# Patient Record
Sex: Female | Born: 1955 | Race: White | Hispanic: No | Marital: Married | Smoking: Never smoker
Health system: Southern US, Community
[De-identification: ages and names within clinical notes are randomized; demographics above are authoritative.]

## PROBLEM LIST (undated history)

## (undated) DIAGNOSIS — K635 Polyp of colon: Secondary | ICD-10-CM

## (undated) DIAGNOSIS — M1990 Unspecified osteoarthritis, unspecified site: Secondary | ICD-10-CM

## (undated) DIAGNOSIS — J302 Other seasonal allergic rhinitis: Secondary | ICD-10-CM

## (undated) DIAGNOSIS — R7303 Prediabetes: Secondary | ICD-10-CM

## (undated) DIAGNOSIS — I471 Supraventricular tachycardia: Secondary | ICD-10-CM

## (undated) DIAGNOSIS — I4719 Other supraventricular tachycardia: Secondary | ICD-10-CM

## (undated) DIAGNOSIS — K279 Peptic ulcer, site unspecified, unspecified as acute or chronic, without hemorrhage or perforation: Secondary | ICD-10-CM

## (undated) DIAGNOSIS — E7800 Pure hypercholesterolemia, unspecified: Secondary | ICD-10-CM

## (undated) DIAGNOSIS — K862 Cyst of pancreas: Secondary | ICD-10-CM

## (undated) DIAGNOSIS — D3500 Benign neoplasm of unspecified adrenal gland: Secondary | ICD-10-CM

## (undated) DIAGNOSIS — R112 Nausea with vomiting, unspecified: Secondary | ICD-10-CM

## (undated) DIAGNOSIS — Z9889 Other specified postprocedural states: Secondary | ICD-10-CM

## (undated) HISTORY — PX: TONSILLECTOMY: SUR1361

## (undated) HISTORY — PX: WISDOM TOOTH EXTRACTION: SHX21

## (undated) HISTORY — PX: CERVICAL CERCLAGE: SHX1329

## (undated) HISTORY — DX: Polyp of colon: K63.5

## (undated) HISTORY — PX: COLONOSCOPY W/ BIOPSIES AND POLYPECTOMY: SHX1376

---

## 1998-07-16 ENCOUNTER — Ambulatory Visit (HOSPITAL_COMMUNITY): Admission: RE | Admit: 1998-07-16 | Discharge: 1998-07-16 | Payer: Self-pay | Admitting: *Deleted

## 2000-01-10 ENCOUNTER — Other Ambulatory Visit: Admission: RE | Admit: 2000-01-10 | Discharge: 2000-01-10 | Payer: Self-pay | Admitting: *Deleted

## 2011-09-17 ENCOUNTER — Other Ambulatory Visit: Payer: Self-pay | Admitting: Family Medicine

## 2011-09-17 DIAGNOSIS — Z1231 Encounter for screening mammogram for malignant neoplasm of breast: Secondary | ICD-10-CM

## 2011-10-21 ENCOUNTER — Ambulatory Visit: Payer: Self-pay

## 2011-10-24 ENCOUNTER — Ambulatory Visit
Admission: RE | Admit: 2011-10-24 | Discharge: 2011-10-24 | Disposition: A | Discharge status: Home / Self Care | Payer: 59 | Source: Ambulatory Visit | Attending: Family Medicine | Admitting: Family Medicine

## 2011-10-24 DIAGNOSIS — Z1231 Encounter for screening mammogram for malignant neoplasm of breast: Secondary | ICD-10-CM

## 2013-06-16 ENCOUNTER — Other Ambulatory Visit: Payer: Self-pay | Admitting: Orthopedic Surgery

## 2013-06-16 DIAGNOSIS — S92102A Unspecified fracture of left talus, initial encounter for closed fracture: Secondary | ICD-10-CM

## 2013-06-17 ENCOUNTER — Ambulatory Visit
Admission: RE | Admit: 2013-06-17 | Discharge: 2013-06-17 | Disposition: A | Discharge status: Home / Self Care | Payer: BC Managed Care – PPO | Source: Ambulatory Visit | Attending: Orthopedic Surgery | Admitting: Orthopedic Surgery

## 2013-06-17 DIAGNOSIS — S92102A Unspecified fracture of left talus, initial encounter for closed fracture: Secondary | ICD-10-CM

## 2013-08-17 ENCOUNTER — Telehealth: Payer: Self-pay | Admitting: Family Medicine

## 2013-08-17 MED ORDER — FLUTICASONE PROPIONATE 50 MCG/ACT NA SUSP: 2.0000 | Freq: Every day | NASAL | Status: DC

## 2013-08-17 NOTE — Telephone Encounter (Signed)
Rx Refilled  

## 2013-08-17 NOTE — Telephone Encounter (Signed)
Flonase 2 sprays in each nostril QD  It is only listed in paper chart during ov on 02/21/11.

## 2013-08-23 ENCOUNTER — Encounter: Payer: Self-pay | Admitting: Family Medicine

## 2013-08-23 ENCOUNTER — Ambulatory Visit (INDEPENDENT_AMBULATORY_CARE_PROVIDER_SITE_OTHER): Payer: BC Managed Care – PPO | Admitting: Family Medicine

## 2013-08-23 VITALS — BP 122/74 | HR 68 | Temp 98.1°F | Resp 16

## 2013-08-23 DIAGNOSIS — L821 Other seborrheic keratosis: Secondary | ICD-10-CM

## 2013-08-23 DIAGNOSIS — Z23 Encounter for immunization: Secondary | ICD-10-CM

## 2013-08-23 NOTE — Progress Notes (Signed)
  Subjective:    Patient ID: Jill [name], female    DOB: 1956-08-31, 57 y.o.   MRN: [id number]  HPI  Patient is a very pleasant 57 year old white female who comes in today complaining of a mole on her back. She states it is a large mole in between her shoulder blades around the level of her bra strap.  Her husband is concerned that it may be growing. She states that it does not hurt, it does not bleed, they have not noticed any change in color. It is approximately 8 mm. It is brown and wartlike in appearance.  There are no melanotic  Past Medical History  Diagnosis Date  . Colon polyp   . Allergy    Current Outpatient Prescriptions on File Prior to Visit  Medication Sig Dispense Refill  . fluticasone (FLONASE) 50 MCG/ACT nasal spray Place 2 sprays into the nose daily.  16 g  11   No current facility-administered medications on file prior to visit.   Allergies  Allergen Reactions  . Codeine Nausea Only    Dizziness  . Valium [Diazepam] Nausea Only    Dizziness  . Penicillins Rash   History   Social History  . Marital Status: Married    Spouse Name: N/A    Number of Children: N/A  . Years of Education: N/A   Occupational History  . Not on file.   Social History Main Topics  . Smoking status: Never Smoker   . Smokeless tobacco: Not on file  . Alcohol Use: Yes     Comment: occasional  . Drug Use: No  . Sexual Activity: Not on file   Other Topics Concern  . Not on file   Social History Narrative  . No narrative on file   Family History  Problem Relation Age of Onset  . Dementia Mother   . Diabetes Father   . Heart disease Father   . Hypertension Father   . Cancer Sister     endometrial  . Cancer Maternal Aunt     ovarian      Review of Systems  All other systems reviewed and are negative.       Objective:   Physical Exam  Vitals reviewed. Cardiovascular: Normal rate and regular rhythm.   Pulmonary/Chest: Effort normal and breath sounds normal.   Skin: Skin is warm. No rash noted.   7-8 mm brown polyp around the level of T7 in the midline of the back.  It is verruciform in appearance.        Assessment & Plan:  1. Seborrheic keratoses I believe this is a seborrheic keratosis.  I reassured the patient that is not cancerous. I offered an excisional biopsy to be 100% sure. The patient is comfortable with simple reassurance and elects not to perform a biopsy. I will give her a flu shot today. We can certainly biopsy the lesion if it gets bigger or if it changes or she wants to be 100% sure.

## 2013-10-25 ENCOUNTER — Other Ambulatory Visit: Payer: Self-pay | Admitting: Family Medicine

## 2014-05-23 ENCOUNTER — Encounter: Payer: Self-pay | Admitting: Family Medicine

## 2014-05-23 ENCOUNTER — Ambulatory Visit (INDEPENDENT_AMBULATORY_CARE_PROVIDER_SITE_OTHER): Payer: BC Managed Care – PPO | Admitting: Family Medicine

## 2014-05-23 VITALS — BP 118/74 | HR 76 | Temp 98.2°F | Resp 14

## 2014-05-23 DIAGNOSIS — W57XXXA Bitten or stung by nonvenomous insect and other nonvenomous arthropods, initial encounter: Secondary | ICD-10-CM

## 2014-05-23 DIAGNOSIS — T148 Other injury of unspecified body region: Secondary | ICD-10-CM

## 2014-05-23 DIAGNOSIS — F430 Acute stress reaction: Secondary | ICD-10-CM

## 2014-05-23 DIAGNOSIS — J029 Acute pharyngitis, unspecified: Secondary | ICD-10-CM

## 2014-05-23 LAB — RAPID STREP SCREEN (MED CTR MEBANE ONLY): Streptococcus, Group A Screen (Direct): POSITIVE — AB

## 2014-05-23 MED ORDER — DOXYCYCLINE HYCLATE 100 MG PO TABS: 100.0000 mg | ORAL_TABLET | Freq: Two times a day (BID) | ORAL | Status: DC

## 2014-05-23 NOTE — Progress Notes (Signed)
Patient ID: MEAGHANN CHOO, female   DOB: [birth year], 58 y.o.   MRN: [id number]   Subjective:    Patient ID: Broadus John, female    DOB: 1955/12/26, 58 y.o.   MRN: [id number]  Patient presents for Illness and Rash  patient here with sore throat for the past 24 hours she's not had any fever she has had some mild sinus drainage but she also been crying a lot yesterday therefore she got is from a little postnasal drip. She also had a tickle in her right upper shoulder that she removed which resulted in a red spot but over the weekend she noticed a small bump on her left side below the shoulder blade with a red rash that extended from it she did not remove a tick from here but thought that she likely had one there is well as she has a lot of ticks in her backyard. She's not had any joint pain or swelling no myalgias.  There has been a lot of stress at home as her father has been put into hospice and the family is not on the same page with his care.    Review Of Systems:  GEN- denies fatigue, fever, weight loss,weakness, recent illness HEENT- denies eye drainage, change in vision, nasal discharge, CVS- denies chest pain, palpitations RESP- denies SOB, cough, wheeze ABD- denies N/V, change in stools, abd pain MSK- denies joint pain, muscle aches, injury Neuro- denies headache, dizziness, syncope, seizure activity       Objective:    BP 118/74  Pulse 76  Temp(Src) 98.2 F (36.8 C) (Oral)  Resp 14 GEN- NAD, alert and oriented x3 HEENT- PERRL, EOMI, non injected sclera, pink conjunctiva, MMM, oropharynx  Injected, TM clear bilat, no tonsils Neck- Supple, shotty LAD CVS- RRR, no murmur RESP-CTAB Skin- Right shoulder- erythematous papule with scab in center, Left back 2cm below shoulder blade eythematous scab with dime dime size area of erythema and a ecchymotic like rash extending 3-4cm above it, NT,  Note - small tick removed off right lower thoracic region with dime size erythema around  it, no fluctuant areas Psych- crying discussing family, not anxious appearing, well groomed, no SI  Pulses- Radial 2+        Assessment & Plan:      Problem List Items Addressed This Visit   None    Visit Diagnoses   Acute pharyngitis, unspecified pharyngitis type    -  Primary    - strep positive, treat with doxy, due to ticks and PCN allergy    Relevant Orders       Rapid Strep Screen (Completed)    Tick bite        Will treat with doxy x 10 days, she will call if not improved     Stress reaction        No intervention needed at this time, a big change for family with fathers declining health,        Note: This dictation was prepared with Dragon dictation along with smaller phrase technology. Any transcriptional errors that result from this process are unintentional.

## 2014-05-23 NOTE — Patient Instructions (Signed)
Take antibiotics as prescribed Take ibuprofen as needed Gargle salt water  F/U as needed

## 2014-12-05 ENCOUNTER — Ambulatory Visit (INDEPENDENT_AMBULATORY_CARE_PROVIDER_SITE_OTHER): Payer: BLUE CROSS/BLUE SHIELD | Admitting: *Deleted

## 2014-12-05 DIAGNOSIS — Z23 Encounter for immunization: Secondary | ICD-10-CM

## 2015-01-11 ENCOUNTER — Other Ambulatory Visit: Payer: BLUE CROSS/BLUE SHIELD

## 2015-01-11 DIAGNOSIS — Z0000 Encounter for general adult medical examination without abnormal findings: Secondary | ICD-10-CM

## 2015-01-12 LAB — CBC WITH DIFFERENTIAL/PLATELET
Basophils Absolute: 0.1 10*3/uL (ref 0.0–0.1)
Basophils Relative: 1 % (ref 0–1)
Eosinophils Absolute: 0.2 10*3/uL (ref 0.0–0.7)
Eosinophils Relative: 3 % (ref 0–5)
HEMATOCRIT: 39.2 % (ref 36.0–46.0)
Hemoglobin: 13.2 g/dL (ref 12.0–15.0)
LYMPHS ABS: 1.8 10*3/uL (ref 0.7–4.0)
LYMPHS PCT: 26 % (ref 12–46)
MCH: 26.4 pg (ref 26.0–34.0)
MCHC: 33.7 g/dL (ref 30.0–36.0)
MCV: 78.4 fL (ref 78.0–100.0)
MPV: 9.5 fL (ref 8.6–12.4)
Monocytes Absolute: 0.4 10*3/uL (ref 0.1–1.0)
Monocytes Relative: 6 % (ref 3–12)
NEUTROS PCT: 64 % (ref 43–77)
Neutro Abs: 4.4 10*3/uL (ref 1.7–7.7)
Platelets: 266 10*3/uL (ref 150–400)
RBC: 5 MIL/uL (ref 3.87–5.11)
RDW: 14.1 % (ref 11.5–15.5)
WBC: 6.8 10*3/uL (ref 4.0–10.5)

## 2015-01-12 LAB — LIPID PANEL
Cholesterol: 260 mg/dL — ABNORMAL HIGH (ref 0–200)
HDL: 70 mg/dL (ref >39)
LDL Cholesterol: 151 mg/dL — ABNORMAL HIGH (ref 0–99)
Total CHOL/HDL Ratio: 3.7 Ratio
Triglycerides: 196 mg/dL — ABNORMAL HIGH (ref <150)
VLDL: 39 mg/dL (ref 0–40)

## 2015-01-12 LAB — COMPLETE METABOLIC PANEL WITH GFR
ALBUMIN: 4.3 g/dL (ref 3.5–5.2)
ALT: 21 U/L (ref 0–35)
AST: 19 U/L (ref 0–37)
Alkaline Phosphatase: 57 U/L (ref 39–117)
BUN: 16 mg/dL (ref 6–23)
CALCIUM: 9.4 mg/dL (ref 8.4–10.5)
CO2: 27 mEq/L (ref 19–32)
Chloride: 104 mEq/L (ref 96–112)
Creat: 0.8 mg/dL (ref 0.50–1.10)
GFR, Est African American: >89 mL/min
GFR, Est Non African American: 83 mL/min
GLUCOSE: 84 mg/dL (ref 70–99)
POTASSIUM: 4.2 meq/L (ref 3.5–5.3)
Sodium: 142 mEq/L (ref 135–145)
Total Bilirubin: 0.4 mg/dL (ref 0.2–1.2)
Total Protein: 6.7 g/dL (ref 6.0–8.3)

## 2015-01-22 ENCOUNTER — Ambulatory Visit (INDEPENDENT_AMBULATORY_CARE_PROVIDER_SITE_OTHER): Payer: BLUE CROSS/BLUE SHIELD | Admitting: Family Medicine

## 2015-01-22 ENCOUNTER — Encounter: Payer: Self-pay | Admitting: Family Medicine

## 2015-01-22 VITALS — BP 114/74 | HR 78 | Temp 98.9°F | Resp 14 | Ht 63.0 in | Wt 153.0 lb

## 2015-01-22 DIAGNOSIS — Z0000 Encounter for general adult medical examination without abnormal findings: Secondary | ICD-10-CM

## 2015-01-22 NOTE — Progress Notes (Signed)
Subjective:    Patient ID: Jill [name], female    DOB: 1956/05/05, 59 y.o.   MRN: [id number]  HPI She is here today for complete physical exam. She denies any concerns. Her mammogram is performed to her gynecologist and is up-to-date. Her last colonoscopy was performed in 2012 according to the patient and is up-to-date. Her Pap smear is performed through her gynecologist and is not yet due. She is getting her Pap smear every 3 years. Patient's flu shot and tetanus shot are up-to-date. Her most recent lab work as listed below: Lab on 01/11/2015  Component Date Value Ref Range Status  . Sodium 01/11/2015 142  135 - 145 mEq/L Final  . Potassium 01/11/2015 4.2  3.5 - 5.3 mEq/L Final  . Chloride 01/11/2015 104  96 - 112 mEq/L Final  . CO2 01/11/2015 27  19 - 32 mEq/L Final  . Glucose, Bld 01/11/2015 84  70 - 99 mg/dL Final  . BUN 01/11/2015 16  6 - 23 mg/dL Final  . Creat 01/11/2015 0.80  0.50 - 1.10 mg/dL Final  . Total Bilirubin 01/11/2015 0.4  0.2 - 1.2 mg/dL Final  . Alkaline Phosphatase 01/11/2015 57  39 - 117 U/L Final  . AST 01/11/2015 19  0 - 37 U/L Final  . ALT 01/11/2015 21  0 - 35 U/L Final  . Total Protein 01/11/2015 6.7  6.0 - 8.3 g/dL Final  . Albumin 01/11/2015 4.3  3.5 - 5.2 g/dL Final  . Calcium 01/11/2015 9.4  8.4 - 10.5 mg/dL Final  . GFR, Est African American 01/11/2015 >89   Final  . GFR, Est Non African American 01/11/2015 83   Final   Comment:   The estimated GFR is a calculation valid for adults (>=52 years old) that uses the CKD-EPI algorithm to adjust for age and sex. It is   not to be used for children, pregnant women, hospitalized patients,    patients on dialysis, or with rapidly changing kidney function. According to the NKDEP, eGFR >89 is normal, 60-89 shows mild impairment, 30-59 shows moderate impairment, 15-29 shows severe impairment and <15 is ESRD.     Marland Kitchen Cholesterol 01/11/2015 260* 0 - 200 mg/dL Final   Comment: ATP III Classification:       <  200        mg/dL        Desirable      200 - 239     mg/dL        Borderline High      >= 240        mg/dL        High     . Triglycerides 01/11/2015 196* <150 mg/dL Final  . HDL 01/11/2015 70  >39 mg/dL Final  . Total CHOL/HDL Ratio 01/11/2015 3.7   Final  . VLDL 01/11/2015 39  0 - 40 mg/dL Final  . LDL Cholesterol 01/11/2015 151* 0 - 99 mg/dL Final   Comment:   Total Cholesterol/HDL Ratio:CHD Risk                        Coronary Heart Disease Risk Table                                        Men       Women  1/2 Average Risk              3.4        3.3              Average Risk              5.0        4.4           2X Average Risk              9.6        7.1           3X Average Risk             23.4       11.0 Use the calculated Patient Ratio above and the CHD Risk table  to determine the patient's CHD Risk. ATP III Classification (LDL):       < 100        mg/dL         Optimal      100 - 129     mg/dL         Near or Above Optimal      130 - 159     mg/dL         Borderline High      160 - 189     mg/dL         High       > 190        mg/dL         Very High     . WBC 01/11/2015 6.8  4.0 - 10.5 K/uL Final  . RBC 01/11/2015 5.00  3.87 - 5.11 MIL/uL Final  . Hemoglobin 01/11/2015 13.2  12.0 - 15.0 g/dL Final  . HCT 01/11/2015 39.2  36.0 - 46.0 % Final  . MCV 01/11/2015 78.4  78.0 - 100.0 fL Final  . MCH 01/11/2015 26.4  26.0 - 34.0 pg Final  . MCHC 01/11/2015 33.7  30.0 - 36.0 g/dL Final  . RDW 01/11/2015 14.1  11.5 - 15.5 % Final  . Platelets 01/11/2015 266  150 - 400 K/uL Final  . MPV 01/11/2015 9.5  8.6 - 12.4 fL Final  . Neutrophils Relative % 01/11/2015 64  43 - 77 % Final  . Neutro Abs 01/11/2015 4.4  1.7 - 7.7 K/uL Final  . Lymphocytes Relative 01/11/2015 26  12 - 46 % Final  . Lymphs Abs 01/11/2015 1.8  0.7 - 4.0 K/uL Final  . Monocytes Relative 01/11/2015 6  3 - 12 % Final  . Monocytes Absolute 01/11/2015 0.4  0.1 - 1.0 K/uL Final  . Eosinophils Relative  01/11/2015 3  0 - 5 % Final  . Eosinophils Absolute 01/11/2015 0.2  0.0 - 0.7 K/uL Final  . Basophils Relative 01/11/2015 1  0 - 1 % Final  . Basophils Absolute 01/11/2015 0.1  0.0 - 0.1 K/uL Final  . Smear Review 01/11/2015 Criteria for review not met   Final   Past Medical History  Diagnosis Date  . Colon polyp   . Allergy    No past surgical history on file. Current Outpatient Prescriptions on File Prior to Visit  Medication Sig Dispense Refill  . aspirin 81 MG tablet Take 81 mg by mouth daily.    . Multiple Vitamin (MULTIVITAMIN WITH MINERALS) TABS tablet Take 1 tablet by mouth daily.    . valACYclovir (VALTREX) 1000 MG tablet TAKE 2 TABS  BY MOUTH 2 TIMES A DAY FOR 1 DAY 4 tablet 4   No current facility-administered medications on file prior to visit.   Allergies  Allergen Reactions  . Codeine Nausea Only    Dizziness  . Valium [Diazepam] Nausea Only    Dizziness  . Penicillins Rash   History   Social History  . Marital Status: Married    Spouse Name: N/A  . Number of Children: N/A  . Years of Education: N/A   Occupational History  . Not on file.   Social History Main Topics  . Smoking status: Never Smoker   . Smokeless tobacco: Never Used  . Alcohol Use: Yes     Comment: occasional  . Drug Use: No  . Sexual Activity: Not on file   Other Topics Concern  . Not on file   Social History Narrative   Family History  Problem Relation Age of Onset  . Dementia Mother   . Diabetes Father   . Heart disease Father   . Hypertension Father   . Cancer Sister     endometrial  . Cancer Maternal Aunt     ovarian      Review of Systems  All other systems reviewed and are negative.      Objective:   Physical Exam  Constitutional: She is oriented to person, place, and time. She appears well-developed. No distress.  HENT:  Head: Normocephalic and atraumatic.  Right Ear: External ear normal.  Left Ear: External ear normal.  Nose: Nose normal.   Mouth/Throat: Oropharynx is clear and moist. No oropharyngeal exudate.  Eyes: Conjunctivae and EOM are normal. Pupils are equal, round, and reactive to light. Right eye exhibits no discharge. Left eye exhibits no discharge. No scleral icterus.  Neck: Normal range of motion. Neck supple. No JVD present. No tracheal deviation present. No thyromegaly present.  Cardiovascular: Normal rate, regular rhythm, normal heart sounds and intact distal pulses.  Exam reveals no gallop and no friction rub.   No murmur heard. Pulmonary/Chest: Effort normal and breath sounds normal. No stridor. No respiratory distress. She has no wheezes. She has no rales. She exhibits no tenderness.  Abdominal: Soft. Bowel sounds are normal. She exhibits no distension and no mass. There is no tenderness. There is no rebound and no guarding.  Musculoskeletal: Normal range of motion. She exhibits no edema or tenderness.  Lymphadenopathy:    She has no cervical adenopathy.  Neurological: She is alert and oriented to person, place, and time. She has normal reflexes. She displays normal reflexes. No cranial nerve deficit. She exhibits normal muscle tone. Coordination normal.  Skin: Skin is warm. No rash noted. She is not diaphoretic. No erythema. No pallor.  Psychiatric: She has a normal mood and affect. Her behavior is normal. Judgment and thought content normal.  Vitals reviewed.         Assessment & Plan:  Routine general medical examination at a health care facility  Patient's blood pressure is excellent. Her lab work is significant for a mildly elevated cholesterol although I believe this is offset by her excellent HDL. I have recommended fish oil 2 g by mouth daily to help lower her LDL cholesterol hopefully below 130. Her immunization cancer screening is up-to-date. I recommended 1000 mg a day of calcium and 1000 units a day of vitamin D. Regular anticipatory guidance is provided.

## 2015-02-14 ENCOUNTER — Telehealth: Payer: Self-pay | Admitting: Family Medicine

## 2015-02-14 MED ORDER — VALACYCLOVIR HCL 1 G PO TABS: ORAL_TABLET | ORAL | Status: DC

## 2015-02-14 NOTE — Telephone Encounter (Signed)
Med sent to pharm 

## 2015-02-14 NOTE — Telephone Encounter (Signed)
Patient is calling to get refill on valtrex if possible  [facility]

## 2015-04-20 ENCOUNTER — Encounter: Payer: Self-pay | Admitting: Family Medicine

## 2015-04-20 ENCOUNTER — Ambulatory Visit (INDEPENDENT_AMBULATORY_CARE_PROVIDER_SITE_OTHER): Payer: BLUE CROSS/BLUE SHIELD | Admitting: Family Medicine

## 2015-04-20 VITALS — BP 120/78 | HR 80 | Temp 98.9°F | Resp 18 | Ht 63.0 in | Wt 149.0 lb

## 2015-04-20 DIAGNOSIS — J029 Acute pharyngitis, unspecified: Secondary | ICD-10-CM | POA: Diagnosis not present

## 2015-04-20 DIAGNOSIS — J069 Acute upper respiratory infection, unspecified: Secondary | ICD-10-CM | POA: Diagnosis not present

## 2015-04-20 LAB — RAPID STREP SCREEN (MED CTR MEBANE ONLY): STREPTOCOCCUS, GROUP A SCREEN (DIRECT): NEGATIVE

## 2015-04-20 NOTE — Progress Notes (Signed)
   Subjective:    Patient ID: Jill [name], female    DOB: 30-Oct-1956, 59 y.o.   MRN: [id number]  HPI Symptoms began yesterday. Patient reports rhinorrhea, postnasal drip, sore throat, congestion, a mild cough. She is afebrile. Strep test today is negative. Physical exam is completely normal. Past Medical History  Diagnosis Date  . Colon polyp   . Allergy    No past surgical history on file. Current Outpatient Prescriptions on File Prior to Visit  Medication Sig Dispense Refill  . aspirin 81 MG tablet Take 81 mg by mouth daily.    . cholecalciferol (VITAMIN D) 1000 UNITS tablet Take 1,000 Units by mouth daily.    . Lutein 20 MG TABS Take by mouth.    . Multiple Vitamin (MULTIVITAMIN WITH MINERALS) TABS tablet Take 1 tablet by mouth daily.    . valACYclovir (VALTREX) 1000 MG tablet TAKE 2 TABS BY MOUTH 2 TIMES A DAY FOR 1 DAY 4 tablet 4   No current facility-administered medications on file prior to visit.   Allergies  Allergen Reactions  . Codeine Nausea Only    Dizziness  . Valium [Diazepam] Nausea Only    Dizziness  . Penicillins Rash   History   Social History  . Marital Status: Married    Spouse Name: N/A  . Number of Children: N/A  . Years of Education: N/A   Occupational History  . Not on file.   Social History Main Topics  . Smoking status: Never Smoker   . Smokeless tobacco: Never Used  . Alcohol Use: Yes     Comment: occasional  . Drug Use: No  . Sexual Activity: Not on file   Other Topics Concern  . Not on file   Social History Narrative      Review of Systems  All other systems reviewed and are negative.      Objective:   Physical Exam  Constitutional: She appears well-developed and well-nourished. No distress.  HENT:  Head: Normocephalic and atraumatic.  Right Ear: External ear normal.  Left Ear: External ear normal.  Nose: Nose normal.  Mouth/Throat: Oropharynx is clear and moist. No oropharyngeal exudate.  Eyes: Conjunctivae are  normal.  Neck: Neck supple.  Cardiovascular: Normal rate, regular rhythm and normal heart sounds.   No murmur heard. Pulmonary/Chest: Effort normal and breath sounds normal. No respiratory distress. She has no wheezes. She has no rales.  Lymphadenopathy:    She has no cervical adenopathy.  Skin: She is not diaphoretic.  Vitals reviewed.         Assessment & Plan:  Sore throat - Plan: Rapid strep screen  URI, acute  Patient has a viral upper respiratory infection. I recommended supportive care. Anticipate gradual improvement over the next 4-5 days. She can use Mucinex for cough, ibuprofen for fever, Sudafed for congestion.

## 2016-09-30 ENCOUNTER — Telehealth: Payer: Self-pay | Admitting: Family Medicine

## 2016-09-30 NOTE — Telephone Encounter (Signed)
Pt calling and states if can get a prescription for Abreva they can use their flexible spending and it will cost less then the Valtrex.  Please advise.

## 2016-09-30 NOTE — Telephone Encounter (Signed)
Abreva apply 5 times a day to the affected part of the lip no more than 10 days

## 2016-10-01 MED ORDER — DOCOSANOL 10 % EX CREA: 1.0000 "application " | TOPICAL_CREAM | Freq: Every day | CUTANEOUS | 1 refills | Status: AC

## 2016-10-01 NOTE — Telephone Encounter (Signed)
rx to pharmacy and have left mess for pt to call back

## 2016-10-29 ENCOUNTER — Other Ambulatory Visit: Payer: BC Managed Care – PPO

## 2016-10-29 ENCOUNTER — Other Ambulatory Visit: Payer: Self-pay | Admitting: Physician Assistant

## 2016-10-29 DIAGNOSIS — Z0000 Encounter for general adult medical examination without abnormal findings: Secondary | ICD-10-CM

## 2016-10-29 DIAGNOSIS — E785 Hyperlipidemia, unspecified: Secondary | ICD-10-CM

## 2016-10-29 LAB — CBC WITH DIFFERENTIAL/PLATELET
BASOS PCT: 1 %
Basophils Absolute: 59 cells/uL (ref 0–200)
EOS ABS: 177 {cells}/uL (ref 15–500)
EOS PCT: 3 %
HCT: 43.1 % (ref 35.0–45.0)
Hemoglobin: 14 g/dL (ref 12.0–15.0)
LYMPHS PCT: 35 %
Lymphs Abs: 2065 cells/uL (ref 850–3900)
MCH: 26.9 pg — ABNORMAL LOW (ref 27.0–33.0)
MCHC: 32.5 g/dL (ref 32.0–36.0)
MCV: 82.7 fL (ref 80.0–100.0)
MONOS PCT: 6 %
MPV: 9.4 fL (ref 7.5–12.5)
Monocytes Absolute: 354 cells/uL (ref 200–950)
Neutro Abs: 3245 cells/uL (ref 1500–7800)
Neutrophils Relative %: 55 %
PLATELETS: 287 10*3/uL (ref 140–400)
RBC: 5.21 MIL/uL — ABNORMAL HIGH (ref 3.80–5.10)
RDW: 14.1 % (ref 11.0–15.0)
WBC: 5.9 10*3/uL (ref 3.8–10.8)

## 2016-10-29 LAB — COMPLETE METABOLIC PANEL WITH GFR
ALT: 15 U/L (ref 6–29)
AST: 15 U/L (ref 10–35)
Albumin: 4.3 g/dL (ref 3.6–5.1)
Alkaline Phosphatase: 62 U/L (ref 33–130)
BILIRUBIN TOTAL: 0.5 mg/dL (ref 0.2–1.2)
BUN: 23 mg/dL (ref 7–25)
CHLORIDE: 102 mmol/L (ref 98–110)
CO2: 24 mmol/L (ref 20–31)
Calcium: 9.6 mg/dL (ref 8.6–10.4)
Creat: 0.86 mg/dL (ref 0.50–1.05)
GFR, Est African American: 86 mL/min (ref >=60)
GFR, Est Non African American: 75 mL/min (ref >=60)
GLUCOSE: 172 mg/dL — AB (ref 70–99)
Potassium: 4.6 mmol/L (ref 3.5–5.3)
SODIUM: 138 mmol/L (ref 135–146)
Total Protein: 6.6 g/dL (ref 6.1–8.1)

## 2016-10-29 LAB — LIPID PANEL
CHOL/HDL RATIO: 4.1 ratio (ref <5.0)
CHOLESTEROL: 266 mg/dL — AB (ref <200)
HDL: 65 mg/dL (ref >50)
LDL CALC: 161 mg/dL — AB (ref <100)
Triglycerides: 199 mg/dL — ABNORMAL HIGH (ref <150)
VLDL: 40 mg/dL — AB (ref <30)

## 2016-10-29 LAB — TSH: TSH: 2.1 mIU/L

## 2016-10-31 ENCOUNTER — Ambulatory Visit (INDEPENDENT_AMBULATORY_CARE_PROVIDER_SITE_OTHER): Payer: BC Managed Care – PPO | Admitting: Physician Assistant

## 2016-10-31 ENCOUNTER — Encounter: Payer: Self-pay | Admitting: Physician Assistant

## 2016-10-31 VITALS — BP 120/68 | HR 99 | Temp 98.7°F | Resp 18 | Wt 140.0 lb

## 2016-10-31 DIAGNOSIS — Z0000 Encounter for general adult medical examination without abnormal findings: Secondary | ICD-10-CM

## 2016-10-31 DIAGNOSIS — R739 Hyperglycemia, unspecified: Secondary | ICD-10-CM | POA: Diagnosis not present

## 2016-10-31 NOTE — Progress Notes (Signed)
Patient ID: Jill [name] MRN: [id number], DOB: [birth year], 60 y.o. Date of Encounter: 10/31/2016,   Chief Complaint: Physical (CPE)  HPI: 60 y.o. y/o female  here for CPE.   She has no complaints or concerns to address today. Is just here for her physical. He sees GYN annually for GYN exam.  Review of Systems: Consitutional: No fever, chills, fatigue, night sweats, lymphadenopathy. No significant/unexplained weight changes. Eyes: No visual changes, eye redness, or discharge. ENT/Mouth: No ear pain, sore throat, nasal drainage, or sinus pain. Cardiovascular: No chest pressure,heaviness, tightness or squeezing, even with exertion. No increased shortness of breath or dyspnea on exertion.No palpitations, edema, orthopnea, PND. Respiratory: No cough, hemoptysis, SOB, or wheezing. Gastrointestinal: No anorexia, dysphagia, reflux, pain, nausea, vomiting, hematemesis, diarrhea, constipation, BRBPR, or melena. Breast: No mass, nodules, bulging, or retraction. No skin changes or inflammation. No nipple discharge. No lymphadenopathy. Genitourinary: No dysuria, hematuria, incontinence, vaginal discharge, pruritis, burning, abnormal bleeding, or pain. Musculoskeletal: No decreased ROM, No joint pain or swelling. No significant pain in neck, back, or extremities. Skin: No rash, pruritis, or concerning lesions. Neurological: No headache, dizziness, syncope, seizures, tremors, memory loss, coordination problems, or paresthesias. Psychological: No anxiety, depression, hallucinations, SI/HI. Endocrine: No polydipsia, polyphagia, polyuria, or known diabetes.No increased fatigue. No palpitations/rapid heart rate. No significant/unexplained weight change. All other systems were reviewed and are otherwise negative.  Past Medical History:  Diagnosis Date  . Allergy   . Colon polyp      No past surgical history on file.  Home Meds:  Outpatient Medications Prior to Visit  Medication Sig Dispense  Refill  . aspirin 81 MG tablet Take 81 mg by mouth daily.    . cholecalciferol (VITAMIN D) 1000 UNITS tablet Take 1,000 Units by mouth daily.    . Lutein 20 MG TABS Take by mouth.    . Multiple Vitamin (MULTIVITAMIN WITH MINERALS) TABS tablet Take 1 tablet by mouth daily.    Marland Kitchen triamcinolone (NASACORT) 55 MCG/ACT AERO nasal inhaler Place 2 sprays into the nose daily.    . valACYclovir (VALTREX) 1000 MG tablet TAKE 2 TABS BY MOUTH 2 TIMES A DAY FOR 1 DAY 4 tablet 4   No facility-administered medications prior to visit.     Allergies:  Allergies  Allergen Reactions  . Codeine Nausea Only    Dizziness  . Valium [Diazepam] Nausea Only    Dizziness  . Penicillins Rash    Social History   Social History  . Marital status: Married    Spouse name: N/A  . Number of children: N/A  . Years of education: N/A   Occupational History  . Not on file.   Social History Main Topics  . Smoking status: Never Smoker  . Smokeless tobacco: Never Used  . Alcohol use Yes     Comment: occasional  . Drug use: No  . Sexual activity: Not on file   Other Topics Concern  . Not on file   Social History Narrative  . No narrative on file    Family History  Problem Relation Age of Onset  . Dementia Mother   . Diabetes Father   . Heart disease Father   . Hypertension Father   . Cancer Sister     endometrial  . Cancer Maternal Aunt     ovarian    Physical Exam: Blood pressure 120/68, pulse 99, temperature 98.7 F (37.1 C), temperature source Oral, resp. rate 18, weight 140 lb (63.5 kg), SpO2 98 %., Body  mass index is 24.8 kg/m. General: Well developed, well nourished WF. Appears in no acute distress. HEENT: Normocephalic, atraumatic. Conjunctiva pink, sclera non-icteric. Pupils 2 mm constricting to 1 mm, round, regular, and equally reactive to light and accomodation. EOMI. Internal auditory canal clear. TMs with good cone of light and without pathology. Nasal mucosa pink. Nares are without  discharge. No sinus tenderness. Oral mucosa pink.  Pharynx without exudate.   Neck: Supple. Trachea midline. No thyromegaly. Full ROM. No lymphadenopathy.No Carotid Bruits. Lungs: Clear to auscultation bilaterally without wheezes, rales, or rhonchi. Breathing is of normal effort and unlabored. Cardiovascular: RRR with S1 S2. No murmurs, rubs, or gallops. Distal pulses 2+ symmetrically. No carotid or abdominal bruits. Breast: Per Gyn Abdomen: Soft, non-tender, non-distended with normoactive bowel sounds. No hepatosplenomegaly or masses. No rebound/guarding. No CVA tenderness. No hernias.  Genitourinary:  Per Gyn Musculoskeletal: Full range of motion and 5/5 strength throughout.  Skin: Warm and moist without erythema, ecchymosis, wounds, or rash. Neuro: A+Ox3. CN II-XII grossly intact. Moves all extremities spontaneously. Full sensation throughout. Normal gait. DTR 2+ throughout upper and lower extremities.  Psych:  Responds to questions appropriately with a normal affect.   Assessment/Plan:  60 y.o. y/o female female here for CPE  1. Encounter for preventive health examination  A. Screening Labs: She recently came and had fasting labs drawn. These were reviewed with her today. All were normal except glucose was elevated. Will have lab add A1c to further evaluate this.  B. Pap: Per Gyn  C. Screening Mammogram: Per Gyn  D. DEXA/BMD:  Not need it yet at this age  E. Colorectal Cancer Screening: Last colonoscopy was 2013. Did show polyps. Last year she thought that follow-up colonoscopy was due so she called her GI but they told her it was not due until 2018. She is aware of this and will follow-up with having follow-up colonoscopy 2018.  F. Immunizations:  Influenza:  Recommended flu vaccine today but she defers. Tetanus:  Up to date. Received 12/05/2014 Pneumococcal: She has no indication to require this until age 60 Zostavax: N/A at this age at this age  60. Hyperglycemia Will have lab add A1c to  blood already collected. Follow-up that result.  Signed, [id number] [place], [place], [facility] 10/31/2016 10:40 AM

## 2016-11-01 LAB — HEMOGLOBIN A1C
Hgb A1c MFr Bld: 6.1 % — ABNORMAL HIGH (ref <5.7)
Mean Plasma Glucose: 128 mg/dL

## 2016-12-01 HISTORY — PX: ADRENALECTOMY: SHX876

## 2017-03-10 ENCOUNTER — Ambulatory Visit (INDEPENDENT_AMBULATORY_CARE_PROVIDER_SITE_OTHER): Payer: BC Managed Care – PPO | Admitting: Family Medicine

## 2017-03-10 ENCOUNTER — Encounter: Payer: Self-pay | Admitting: Family Medicine

## 2017-03-10 VITALS — BP 140/82 | HR 88 | Temp 98.3°F | Resp 16 | Wt 139.0 lb

## 2017-03-10 DIAGNOSIS — I471 Supraventricular tachycardia: Secondary | ICD-10-CM | POA: Diagnosis not present

## 2017-03-10 DIAGNOSIS — R7303 Prediabetes: Secondary | ICD-10-CM

## 2017-03-10 LAB — CBC WITH DIFFERENTIAL/PLATELET
Basophils Absolute: 61 cells/uL (ref 0–200)
Basophils Relative: 1 %
EOS PCT: 3 %
Eosinophils Absolute: 183 cells/uL (ref 15–500)
HCT: 39.7 % (ref 35.0–45.0)
Hemoglobin: 13.4 g/dL (ref 12.0–15.0)
Lymphocytes Relative: 30 %
Lymphs Abs: 1830 cells/uL (ref 850–3900)
MCH: 27.4 pg (ref 27.0–33.0)
MCHC: 33.8 g/dL (ref 32.0–36.0)
MCV: 81.2 fL (ref 80.0–100.0)
MONOS PCT: 5 %
MPV: 9.6 fL (ref 7.5–12.5)
Monocytes Absolute: 305 cells/uL (ref 200–950)
NEUTROS PCT: 61 %
Neutro Abs: 3721 cells/uL (ref 1500–7800)
Platelets: 312 10*3/uL (ref 140–400)
RBC: 4.89 MIL/uL (ref 3.80–5.10)
RDW: 14 % (ref 11.0–15.0)
WBC: 6.1 10*3/uL (ref 3.8–10.8)

## 2017-03-10 LAB — COMPLETE METABOLIC PANEL WITH GFR
ALK PHOS: 67 U/L (ref 33–130)
ALT: 13 U/L (ref 6–29)
AST: 13 U/L (ref 10–35)
Albumin: 4.3 g/dL (ref 3.6–5.1)
BUN: 20 mg/dL (ref 7–25)
CO2: 26 mmol/L (ref 20–31)
Calcium: 9.6 mg/dL (ref 8.6–10.4)
Chloride: 104 mmol/L (ref 98–110)
Creat: 0.86 mg/dL (ref 0.50–1.05)
GFR, EST NON AFRICAN AMERICAN: 74 mL/min (ref >=60)
GFR, Est African American: 86 mL/min (ref >=60)
GLUCOSE: 145 mg/dL — AB (ref 70–99)
POTASSIUM: 4.3 mmol/L (ref 3.5–5.3)
SODIUM: 139 mmol/L (ref 135–146)
Total Bilirubin: 0.5 mg/dL (ref 0.2–1.2)
Total Protein: 6.5 g/dL (ref 6.1–8.1)

## 2017-03-10 NOTE — Progress Notes (Signed)
Subjective:    Patient ID: Jill [name], female    DOB: 05-27-1956, 60 y.o.   MRN: [id number]  HPI Patient is here today to follow-up her prediabetes. In December, the patient was found to have a fasting blood sugar 172 however her hemoglobin A1c was 6.1. In that time she is tried exercise more and change her diet and she is here today to follow that back up. She denies any polyuria, polydipsia, blurry vision. However she does complain of episodes of tachycardia. These occur suddenly and with minimal activity. She usually notices them when she's walking the dog. They lasts several minutes and then resolve spontaneously. She denies any syncope. She denies any chest pain or shortness of breath associated with it.  Past Medical History:  Diagnosis Date  . Allergy   . Colon polyp    No past surgical history on file. Current Outpatient Prescriptions on File Prior to Visit  Medication Sig Dispense Refill  . aspirin 81 MG tablet Take 81 mg by mouth daily.    . cholecalciferol (VITAMIN D) 1000 UNITS tablet Take 1,000 Units by mouth daily.    . Lutein 20 MG TABS Take by mouth.    . Multiple Vitamin (MULTIVITAMIN WITH MINERALS) TABS tablet Take 1 tablet by mouth daily.    Marland Kitchen triamcinolone (NASACORT) 55 MCG/ACT AERO nasal inhaler Place 2 sprays into the nose daily.    . valACYclovir (VALTREX) 1000 MG tablet TAKE 2 TABS BY MOUTH 2 TIMES A DAY FOR 1 DAY 4 tablet 4   No current facility-administered medications on file prior to visit.    Allergies  Allergen Reactions  . Codeine Nausea Only    Dizziness  . Valium [Diazepam] Nausea Only    Dizziness  . Penicillins Rash   Social History   Social History  . Marital status: Married    Spouse name: N/A  . Number of children: N/A  . Years of education: N/A   Occupational History  . Not on file.   Social History Main Topics  . Smoking status: Never Smoker  . Smokeless tobacco: Never Used  . Alcohol use Yes     Comment: occasional  . Drug  use: No  . Sexual activity: Not on file   Other Topics Concern  . Not on file   Social History Narrative  . No narrative on file       Review of Systems  All other systems reviewed and are negative.      Objective:   Physical Exam  Neck: No JVD present. No thyromegaly present.  Cardiovascular: Normal rate, regular rhythm and normal heart sounds.   No murmur heard. Pulmonary/Chest: Effort normal and breath sounds normal. No respiratory distress. She has no wheezes. She has no rales.  Abdominal: Soft. Bowel sounds are normal. She exhibits no distension. There is no tenderness. There is no rebound and no guarding.  Vitals reviewed.         Assessment & Plan:  Paroxysmal supraventricular tachycardia ([place]) - Plan: EKG 12-Lead, EKG 12-Lead  Prediabetes - Plan: CBC with Differential/Platelet, COMPLETE METABOLIC PANEL WITH GFR, Hemoglobin A1c  TSH was just checked in December and was normal.  Repeat hemoglobin A1c. Goal hemoglobin A1c is less than 6.5. Unless the patient is dehydrated secondary to hyperglycemia, I see no reason that the prediabetes will be triggering her episodes of tachycardia. I'm concerned about possible SVT. EKG today shows normal sinus rhythm with normal intervals and normal axis with no evidence of  ischemia or infarction. I will obtain a CBC, CMP, hemoglobin A1c. If labs are normal, I would recommend Holter monitor to evaluate for cardiac arrhythmias

## 2017-03-11 LAB — HEMOGLOBIN A1C
HEMOGLOBIN A1C: 6.1 % — AB (ref <5.7)
Mean Plasma Glucose: 128 mg/dL

## 2017-03-18 ENCOUNTER — Ambulatory Visit (INDEPENDENT_AMBULATORY_CARE_PROVIDER_SITE_OTHER): Payer: BC Managed Care – PPO | Admitting: Family Medicine

## 2017-03-18 DIAGNOSIS — I471 Supraventricular tachycardia: Secondary | ICD-10-CM | POA: Diagnosis not present

## 2017-03-18 NOTE — Patient Instructions (Addendum)
Holter monitor applied to patient without problem.  To wear for 24 hrs and then return tomorrow morning to have removed.  Given diary and explained what to log if needed.  Told to not remove of get area wet.  Given extra leads if by chance one comes off.  Pt acknowledged understanding.

## 2017-03-19 ENCOUNTER — Ambulatory Visit: Payer: BC Managed Care – PPO | Admitting: Family Medicine

## 2017-03-19 DIAGNOSIS — I471 Supraventricular tachycardia: Secondary | ICD-10-CM

## 2017-03-19 NOTE — Patient Instructions (Signed)
Pt returns after wearing Holter Monitor for 24 hrs.  Pt expressed no problems.  Returned diary with a few entries noted.  [employer] called for pick up  Pick up # [id number]  Tracking [id number] [phone]

## 2017-03-23 ENCOUNTER — Emergency Department (HOSPITAL_COMMUNITY): Payer: BC Managed Care – PPO

## 2017-03-23 ENCOUNTER — Telehealth: Payer: Self-pay | Admitting: Family Medicine

## 2017-03-23 ENCOUNTER — Observation Stay (HOSPITAL_COMMUNITY)
Admission: EM | Admit: 2017-03-23 | Discharge: 2017-03-24 | Disposition: A | Discharge status: Home / Self Care | Payer: BC Managed Care – PPO | Attending: Internal Medicine | Admitting: Internal Medicine

## 2017-03-23 ENCOUNTER — Encounter (HOSPITAL_COMMUNITY): Payer: Self-pay | Admitting: Emergency Medicine

## 2017-03-23 DIAGNOSIS — I471 Supraventricular tachycardia: Secondary | ICD-10-CM | POA: Diagnosis present

## 2017-03-23 DIAGNOSIS — R7303 Prediabetes: Secondary | ICD-10-CM | POA: Insufficient documentation

## 2017-03-23 DIAGNOSIS — I4581 Long QT syndrome: Secondary | ICD-10-CM | POA: Insufficient documentation

## 2017-03-23 DIAGNOSIS — I160 Hypertensive urgency: Secondary | ICD-10-CM | POA: Diagnosis not present

## 2017-03-23 DIAGNOSIS — Z79899 Other long term (current) drug therapy: Secondary | ICD-10-CM | POA: Diagnosis not present

## 2017-03-23 DIAGNOSIS — L989 Disorder of the skin and subcutaneous tissue, unspecified: Secondary | ICD-10-CM | POA: Diagnosis not present

## 2017-03-23 DIAGNOSIS — R51 Headache: Secondary | ICD-10-CM | POA: Insufficient documentation

## 2017-03-23 DIAGNOSIS — J329 Chronic sinusitis, unspecified: Secondary | ICD-10-CM | POA: Diagnosis present

## 2017-03-23 DIAGNOSIS — J019 Acute sinusitis, unspecified: Secondary | ICD-10-CM | POA: Diagnosis not present

## 2017-03-23 DIAGNOSIS — R0600 Dyspnea, unspecified: Secondary | ICD-10-CM | POA: Insufficient documentation

## 2017-03-23 DIAGNOSIS — I4719 Other supraventricular tachycardia: Secondary | ICD-10-CM | POA: Diagnosis present

## 2017-03-23 DIAGNOSIS — R739 Hyperglycemia, unspecified: Secondary | ICD-10-CM | POA: Diagnosis present

## 2017-03-23 DIAGNOSIS — D72829 Elevated white blood cell count, unspecified: Secondary | ICD-10-CM | POA: Diagnosis not present

## 2017-03-23 DIAGNOSIS — R002 Palpitations: Secondary | ICD-10-CM | POA: Diagnosis not present

## 2017-03-23 DIAGNOSIS — R112 Nausea with vomiting, unspecified: Secondary | ICD-10-CM

## 2017-03-23 DIAGNOSIS — I10 Essential (primary) hypertension: Secondary | ICD-10-CM | POA: Diagnosis not present

## 2017-03-23 DIAGNOSIS — R519 Headache, unspecified: Secondary | ICD-10-CM

## 2017-03-23 HISTORY — DX: Pure hypercholesterolemia, unspecified: E78.00

## 2017-03-23 HISTORY — DX: Other seasonal allergic rhinitis: J30.2

## 2017-03-23 HISTORY — DX: Supraventricular tachycardia: I47.1

## 2017-03-23 HISTORY — DX: Unspecified osteoarthritis, unspecified site: M19.90

## 2017-03-23 HISTORY — DX: Other supraventricular tachycardia: I47.19

## 2017-03-23 HISTORY — DX: Nausea with vomiting, unspecified: Z98.890

## 2017-03-23 HISTORY — DX: Prediabetes: R73.03

## 2017-03-23 HISTORY — DX: Peptic ulcer, site unspecified, unspecified as acute or chronic, without hemorrhage or perforation: K27.9

## 2017-03-23 HISTORY — DX: Nausea with vomiting, unspecified: R11.2

## 2017-03-23 LAB — COMPREHENSIVE METABOLIC PANEL
ALBUMIN: 3.8 g/dL (ref 3.5–5.0)
ALK PHOS: 76 U/L (ref 38–126)
ALT: 26 U/L (ref 14–54)
ANION GAP: 11 (ref 5–15)
AST: 33 U/L (ref 15–41)
BILIRUBIN TOTAL: 0.8 mg/dL (ref 0.3–1.2)
BUN: 19 mg/dL (ref 6–20)
CALCIUM: 9.3 mg/dL (ref 8.9–10.3)
CO2: 22 mmol/L (ref 22–32)
Chloride: 106 mmol/L (ref 101–111)
Creatinine, Ser: 1.19 mg/dL — ABNORMAL HIGH (ref 0.44–1.00)
GFR calc non Af Amer: 49 mL/min — ABNORMAL LOW (ref >60)
GFR, EST AFRICAN AMERICAN: 57 mL/min — AB (ref >60)
GLUCOSE: 313 mg/dL — AB (ref 65–99)
POTASSIUM: 4.5 mmol/L (ref 3.5–5.1)
Sodium: 139 mmol/L (ref 135–145)
TOTAL PROTEIN: 7 g/dL (ref 6.5–8.1)

## 2017-03-23 LAB — CBC WITH DIFFERENTIAL/PLATELET
BASOS PCT: 0 %
Basophils Absolute: 0 10*3/uL (ref 0.0–0.1)
EOS ABS: 0 10*3/uL (ref 0.0–0.7)
Eosinophils Relative: 0 %
HEMATOCRIT: 41.1 % (ref 36.0–46.0)
Hemoglobin: 13.7 g/dL (ref 12.0–15.0)
LYMPHS ABS: 1 10*3/uL (ref 0.7–4.0)
Lymphocytes Relative: 5 %
MCH: 26.9 pg (ref 26.0–34.0)
MCHC: 33.3 g/dL (ref 30.0–36.0)
MCV: 80.6 fL (ref 78.0–100.0)
MONO ABS: 1.2 10*3/uL — AB (ref 0.1–1.0)
MONOS PCT: 6 %
NEUTROS ABS: 18.3 10*3/uL — AB (ref 1.7–7.7)
Neutrophils Relative %: 89 %
Platelets: 375 10*3/uL (ref 150–400)
RBC: 5.1 MIL/uL (ref 3.87–5.11)
RDW: 13.1 % (ref 11.5–15.5)
WBC: 20.5 10*3/uL — ABNORMAL HIGH (ref 4.0–10.5)

## 2017-03-23 LAB — I-STAT CHEM 8, ED
BUN: 22 mg/dL — AB (ref 6–20)
CALCIUM ION: 1.15 mmol/L (ref 1.15–1.40)
CHLORIDE: 106 mmol/L (ref 101–111)
Creatinine, Ser: 1 mg/dL (ref 0.44–1.00)
GLUCOSE: 315 mg/dL — AB (ref 65–99)
HCT: 41 % (ref 36.0–46.0)
Hemoglobin: 13.9 g/dL (ref 12.0–15.0)
Potassium: 4.5 mmol/L (ref 3.5–5.1)
SODIUM: 139 mmol/L (ref 135–145)
TCO2: 22 mmol/L (ref 0–100)

## 2017-03-23 LAB — LIPASE, BLOOD: Lipase: 22 U/L (ref 11–51)

## 2017-03-23 LAB — I-STAT TROPONIN, ED: TROPONIN I, POC: 0.02 ng/mL (ref 0.00–0.08)

## 2017-03-23 MED ORDER — PROMETHAZINE HCL 25 MG/ML IJ SOLN
25.0000 mg | Freq: Once | INTRAMUSCULAR | Status: AC
  Administered 2017-03-23: 25 mg via INTRAVENOUS
  Filled 2017-03-23: qty 1

## 2017-03-23 MED ORDER — DIPHENHYDRAMINE HCL 50 MG/ML IJ SOLN
50.0000 mg | Freq: Once | INTRAMUSCULAR | Status: AC
  Administered 2017-03-23: 50 mg via INTRAVENOUS
  Filled 2017-03-23: qty 1

## 2017-03-23 MED ORDER — SODIUM CHLORIDE 0.9 % IV BOLUS (SEPSIS)
1000.0000 mL | Freq: Once | INTRAVENOUS | Status: AC
  Administered 2017-03-23: 1000 mL via INTRAVENOUS

## 2017-03-23 MED ORDER — LABETALOL HCL 5 MG/ML IV SOLN
10.0000 mg | Freq: Once | INTRAVENOUS | Status: AC
  Administered 2017-03-23: 10 mg via INTRAVENOUS
  Filled 2017-03-23: qty 4

## 2017-03-23 MED ORDER — HYDROCORTISONE NA SUCCINATE PF 100 MG IJ SOLR
200.0000 mg | Freq: Once | INTRAMUSCULAR | Status: AC
  Administered 2017-03-23: 200 mg via INTRAVENOUS
  Filled 2017-03-23: qty 4

## 2017-03-23 MED ORDER — HYDRALAZINE HCL 20 MG/ML IJ SOLN
10.0000 mg | Freq: Once | INTRAMUSCULAR | Status: AC
  Administered 2017-03-23: 10 mg via INTRAVENOUS
  Filled 2017-03-23: qty 1

## 2017-03-23 MED ORDER — ONDANSETRON HCL 4 MG/2ML IJ SOLN
4.0000 mg | Freq: Once | INTRAMUSCULAR | Status: AC
  Administered 2017-03-23: 4 mg via INTRAVENOUS
  Filled 2017-03-23: qty 2

## 2017-03-23 MED ORDER — IOPAMIDOL (ISOVUE-370) INJECTION 76%
INTRAVENOUS | Status: AC
  Administered 2017-03-23: 100 mL
  Filled 2017-03-23: qty 100

## 2017-03-23 NOTE — ED Notes (Signed)
Patient transported to X-ray 

## 2017-03-23 NOTE — ED Provider Notes (Signed)
[facility] DEPT Provider Note   CSN: [id number] Arrival date & time: 03/23/17  1910     History   Chief Complaint Chief Complaint  Patient presents with  . Abnormal ECG  . Hypertension    HPI Jill [name] is a 61 y.o. female.  The history is provided by the patient.  Hypertension  This is a new problem. The current episode started 6 to 12 hours ago. The problem occurs constantly. The problem has not changed since onset.Associated symptoms include headaches and shortness of breath. Pertinent negatives include no chest pain and no abdominal pain. Nothing aggravates the symptoms. Nothing relieves the symptoms. She has tried nothing for the symptoms. The treatment provided no relief.    Past Medical History:  Diagnosis Date  . Allergy   . Colon polyp     Patient Active Problem List   Diagnosis Date Noted  . HLD (hyperlipidemia) 10/29/2016    History reviewed. No pertinent surgical history.  OB History    No data available       Home Medications    Prior to Admission medications   Medication Sig Start Date End Date Taking? Authorizing Provider  aspirin 81 MG tablet Take 81 mg by mouth daily.    Historical Provider, MD  cholecalciferol (VITAMIN D) 1000 UNITS tablet Take 1,000 Units by mouth daily.    Historical Provider, MD  Lutein 20 MG TABS Take by mouth.    Historical Provider, MD  Multiple Vitamin (MULTIVITAMIN WITH MINERALS) TABS tablet Take 1 tablet by mouth daily.    Historical Provider, MD  triamcinolone (NASACORT) 55 MCG/ACT AERO nasal inhaler Place 2 sprays into the nose daily.    Historical Provider, MD  valACYclovir (VALTREX) 1000 MG tablet TAKE 2 TABS BY MOUTH 2 TIMES A DAY FOR 1 DAY 02/14/15   Susy Frizzle, MD    Family History Family History  Problem Relation Age of Onset  . Dementia Mother   . Diabetes Father   . Heart disease Father   . Hypertension Father   . Cancer Sister     endometrial  . Cancer Maternal Aunt     ovarian     Social History Social History  Substance Use Topics  . Smoking status: Never Smoker  . Smokeless tobacco: Never Used  . Alcohol use Yes     Comment: occasional     Allergies   Codeine; Valium [diazepam]; and Penicillins   Review of Systems Review of Systems  Constitutional: Negative for chills and fever.  HENT: Negative for ear pain and sore throat.   Eyes: Negative for pain and visual disturbance.  Respiratory: Positive for cough and shortness of breath.   Cardiovascular: Positive for palpitations. Negative for chest pain.  Gastrointestinal: Positive for nausea and vomiting. Negative for abdominal pain.  Genitourinary: Negative for dysuria and hematuria.  Musculoskeletal: Negative for arthralgias and back pain.  Skin: Negative for color change and rash.  Neurological: Positive for headaches. Negative for seizures and syncope.  Psychiatric/Behavioral: Negative for confusion.  All other systems reviewed and are negative.    Physical Exam Updated Vital Signs BP (!) 161/79   Pulse 68   Temp 98.9 F (37.2 C) (Oral)   Resp 20   Ht 5\' 3"  (1.6 m)   Wt 63 kg   SpO2 96%   BMI 24.62 kg/m   Physical Exam  Constitutional: She appears well-developed and well-nourished. She has a sickly appearance. She appears ill. She appears distressed.  HENT:  Head:  Normocephalic and atraumatic.  Eyes: Conjunctivae are normal.  Neck: Neck supple.  Cardiovascular: Normal rate and regular rhythm.   No murmur heard. Pulmonary/Chest: Effort normal and breath sounds normal. No respiratory distress.  Abdominal: Soft. There is no tenderness.  Musculoskeletal: She exhibits no edema.  Neurological: She is alert. No cranial nerve deficit or sensory deficit. GCS eye subscore is 4. GCS verbal subscore is 5. GCS motor subscore is 6.  Skin: Skin is warm and dry.  Psychiatric: She has a normal mood and affect. Her speech is normal.  Nursing note and vitals reviewed.    ED Treatments /  Results  Labs (all labs ordered are listed, but only abnormal results are displayed) Labs Reviewed  CBC WITH DIFFERENTIAL/PLATELET - Abnormal; Notable for the following:       Result Value   WBC 20.5 (*)    Neutro Abs 18.3 (*)    Monocytes Absolute 1.2 (*)    All other components within normal limits  COMPREHENSIVE METABOLIC PANEL - Abnormal; Notable for the following:    Glucose, Bld 313 (*)    Creatinine, Ser 1.19 (*)    GFR calc non Af Amer 49 (*)    GFR calc Af Amer 57 (*)    All other components within normal limits  I-STAT CHEM 8, ED - Abnormal; Notable for the following:    BUN 22 (*)    Glucose, Bld 315 (*)    All other components within normal limits  LIPASE, BLOOD  I-STAT TROPOININ, ED    EKG  EKG Interpretation  Date/Time:  Monday March 23 2017 19:55:27 EDT Ventricular Rate:  83 PR Interval:    QRS Duration: 92 QT Interval:  453 QTC Calculation: 533 R Axis:   26 Text Interpretation:  Sinus or ectopic atrial rhythm Probable left ventricular hypertrophy Prolonged QT interval Baseline wander in lead(s) II III aVR aVL aVF V1 V3 V4 V6 No significant change since last tracing Confirmed by YAO  MD, DAVID ([id number]) on 03/23/2017 7:58:39 PM       Radiology Dg Chest 2 View  Result Date: 03/23/2017 CLINICAL DATA:  Tachycardia. Nausea and vomiting. Headache and cough. EXAM: CHEST  2 VIEW COMPARISON:  None. FINDINGS: Artifact overlies chest. Heart size is normal. Mediastinal shadows are normal. The lungs are clear. No effusions. Minimal spinal curvature. No acute bone finding. IMPRESSION: No active cardiopulmonary disease. Electronically Signed   By: Nelson Chimes M.D.   On: 03/23/2017 20:37   Ct Head Wo Contrast  Result Date: 03/23/2017 CLINICAL DATA:  Intermittent severe headache.  Hypertension. EXAM: CT HEAD WITHOUT CONTRAST TECHNIQUE: Contiguous axial images were obtained from the base of the skull through the vertex without intravenous contrast. COMPARISON:  None.  FINDINGS: BRAIN: No intraparenchymal hemorrhage, mass effect nor midline shift. The ventricles and sulci are normal. No acute large vascular territory infarcts. No abnormal extra-axial fluid collections. Basal cisterns are patent. VASCULAR: Unremarkable. SKULL/SOFT TISSUES: No skull fracture. Osteopenia. No significant soft tissue swelling. ORBITS/SINUSES: The included ocular globes and orbital contents are normal.Moderate paranasal sinus mucosal thickening. Mastoid air cells are well aerated. OTHER: None. IMPRESSION: Normal CT HEAD. Moderate paranasal sinusitis. Electronically Signed   By: Elon Alas M.D.   On: 03/23/2017 21:11    Procedures Procedures (including critical care time)  Medications Ordered in ED Medications  ondansetron [facility]) injection 4 mg (4 mg Intravenous Given 03/23/17 2003)  sodium chloride 0.9 % bolus 1,000 mL (0 mLs Intravenous Stopped 03/23/17 2148)  labetalol (NORMODYNE,TRANDATE) injection  10 mg (10 mg Intravenous Given 03/23/17 2109)  iopamidol (ISOVUE-370) 76 % injection (100 mLs  Contrast Given 03/23/17 2349)  hydrocortisone sodium succinate (SOLU-CORTEF) 100 MG injection 200 mg (200 mg Intravenous Given 03/23/17 2103)  hydrALAZINE (APRESOLINE) injection 10 mg (10 mg Intravenous Given 03/23/17 2220)  promethazine (PHENERGAN) injection 25 mg (25 mg Intravenous Given 03/23/17 2220)  diphenhydrAMINE (BENADRYL) injection 50 mg (50 mg Intravenous Given 03/23/17 2341)     Initial Impression / Assessment and Plan / ED Course  I have reviewed the triage vital signs and the nursing notes.  Pertinent labs & imaging results that were available during my care of the patient were reviewed by me and considered in my medical decision making (see chart for details).     61 year old female with no significant past medical history presents in the setting of hypertension, headache, EKG changes. Patient reports yesterday she started having coughing with mild wheezing and went to  urgent care where was diagnosed with bronchitis. Patient discharged home with a course of prednisone as well as an albuterol breathing treatment. Patient reports she was feeling well until today when she took a medicine with dextromethorphan and began to have nausea and vomiting. Patient began having worsening shortness of breath and fatigue and for this went to an urgent care. At urgent care patient had blood pressure with systolics in the 161W and for this wasn't emergency department.  On arrival patient appeared in distress with significant headache which she reports had happened within the last 2 hours. Patient reports generalized headache was sudden onset. She additionally reports photophobia and no history of headaches. EKG revealed mildly elevated generalized T waves but no signs of acute ischemia, ST segment elevation or depression. Patient was noted to have elevated blood pressure without history of hypertension. CT head obtained with no acute intracranial abnormality. At this point patient has very low chance of subarachnoid hemorrhage and do not believe lumbar puncture is indicated as CT scan is within 6 hours of headache onset. Patient with elevation of white blood cell count but is on prednisone. No signs of infectious etiology. Chest x-ray without widened mediastinum. No signs of infection. No kidney dysfunction or electrolyte antibodies noted. Patient required multiple doses of IV antihypertensives but continued to have recurrent headache and hypertension. CT scan was obtained with dissection study with no obvious dissection noted.  As patient continues to have elevated blood pressure and signs of hypertensive urgency she will require admission to hospitalist for further management as condition. Patient stable at time of transfer of care.  Final Clinical Impressions(s) / ED Diagnoses   Final diagnoses:  Hypertensive urgency  Nausea and vomiting, intractability of vomiting not specified,  unspecified vomiting type  Nonintractable headache, unspecified chronicity pattern, unspecified headache type    New Prescriptions New Prescriptions   No medications on file     Esaw Grandchild, MD 03/23/17 [place] Yao, MD 03/25/17 1755

## 2017-03-23 NOTE — ED Triage Notes (Signed)
Pt from [facility] urgent [facility]. Around 1500 pt started having palpitations and feeling sick to her stomach. Pt went to urgent care and upon arrival started c/o a severe headache. Urgent care found peaked T waves on Ekg and BP to be in the 200s/100s.Marland Kitchen Upon EMS arrival, pt vomited about 200ccs of emesis. EMS gave 500ccs of fluid. EMS VS BP 178/76, HR 80, RR16, SpO2 98% RA. CBG 435. Pt's primary care doctor is evaluating her for diabetes. Pt felt nauseous and dizzy coming into hospital. Pt has hx of preeclampsia.

## 2017-03-23 NOTE — ED Notes (Addendum)
Patient transported to CT. EDP stated to have CT stat.

## 2017-03-23 NOTE — Telephone Encounter (Signed)
Pt called [facility] stating that she had to go to UC over the weekend and was given 2 medications that she had a severe allergic reaction to tessalon and zyrtec to the point that her throat was closing up and she wanted to make [place] aware to put it on her list.

## 2017-03-23 NOTE — ED Notes (Signed)
ED Provider at bedside. 

## 2017-03-24 ENCOUNTER — Observation Stay (HOSPITAL_BASED_OUTPATIENT_CLINIC_OR_DEPARTMENT_OTHER): Payer: BC Managed Care – PPO

## 2017-03-24 ENCOUNTER — Encounter (HOSPITAL_COMMUNITY): Payer: Self-pay | Admitting: Cardiology

## 2017-03-24 DIAGNOSIS — J329 Chronic sinusitis, unspecified: Secondary | ICD-10-CM | POA: Diagnosis present

## 2017-03-24 DIAGNOSIS — R112 Nausea with vomiting, unspecified: Secondary | ICD-10-CM

## 2017-03-24 DIAGNOSIS — R51 Headache: Secondary | ICD-10-CM | POA: Diagnosis not present

## 2017-03-24 DIAGNOSIS — I471 Supraventricular tachycardia: Secondary | ICD-10-CM

## 2017-03-24 DIAGNOSIS — I10 Essential (primary) hypertension: Secondary | ICD-10-CM | POA: Diagnosis not present

## 2017-03-24 DIAGNOSIS — J019 Acute sinusitis, unspecified: Secondary | ICD-10-CM

## 2017-03-24 DIAGNOSIS — R519 Headache, unspecified: Secondary | ICD-10-CM | POA: Diagnosis present

## 2017-03-24 DIAGNOSIS — D72829 Elevated white blood cell count, unspecified: Secondary | ICD-10-CM

## 2017-03-24 DIAGNOSIS — R739 Hyperglycemia, unspecified: Secondary | ICD-10-CM | POA: Diagnosis not present

## 2017-03-24 DIAGNOSIS — R9431 Abnormal electrocardiogram [ECG] [EKG]: Secondary | ICD-10-CM

## 2017-03-24 DIAGNOSIS — I160 Hypertensive urgency: Principal | ICD-10-CM

## 2017-03-24 DIAGNOSIS — J0110 Acute frontal sinusitis, unspecified: Secondary | ICD-10-CM

## 2017-03-24 LAB — CBC
HCT: 40.6 % (ref 36.0–46.0)
HEMOGLOBIN: 13.6 g/dL (ref 12.0–15.0)
MCH: 26.7 pg (ref 26.0–34.0)
MCHC: 33.5 g/dL (ref 30.0–36.0)
MCV: 79.8 fL (ref 78.0–100.0)
Platelets: 334 10*3/uL (ref 150–400)
RBC: 5.09 MIL/uL (ref 3.87–5.11)
RDW: 13.3 % (ref 11.5–15.5)
WBC: 22.9 10*3/uL — ABNORMAL HIGH (ref 4.0–10.5)

## 2017-03-24 LAB — URINALYSIS, ROUTINE W REFLEX MICROSCOPIC
Bacteria, UA: NONE SEEN
Bilirubin Urine: NEGATIVE
Hgb urine dipstick: NEGATIVE
KETONES UR: NEGATIVE mg/dL
Leukocytes, UA: NEGATIVE
NITRITE: NEGATIVE
PH: 5 (ref 5.0–8.0)
Protein, ur: NEGATIVE mg/dL
SPECIFIC GRAVITY, URINE: 1.011 (ref 1.005–1.030)
Squamous Epithelial / HPF: NONE SEEN

## 2017-03-24 LAB — BASIC METABOLIC PANEL
Anion gap: 10 (ref 5–15)
BUN: 17 mg/dL (ref 6–20)
CHLORIDE: 108 mmol/L (ref 101–111)
CO2: 23 mmol/L (ref 22–32)
Calcium: 9.4 mg/dL (ref 8.9–10.3)
Creatinine, Ser: 0.93 mg/dL (ref 0.44–1.00)
GFR calc non Af Amer: >60 mL/min (ref >60)
Glucose, Bld: 219 mg/dL — ABNORMAL HIGH (ref 65–99)
POTASSIUM: 4.3 mmol/L (ref 3.5–5.1)
Sodium: 141 mmol/L (ref 135–145)

## 2017-03-24 LAB — ECHOCARDIOGRAM COMPLETE
HEIGHTINCHES: 63 in
WEIGHTICAEL: 2153.6 [oz_av]

## 2017-03-24 LAB — GLUCOSE, CAPILLARY
GLUCOSE-CAPILLARY: 208 mg/dL — AB (ref 65–99)
Glucose-Capillary: 195 mg/dL — ABNORMAL HIGH (ref 65–99)

## 2017-03-24 LAB — TSH: TSH: 0.594 u[IU]/mL (ref 0.350–4.500)

## 2017-03-24 LAB — HIV ANTIBODY (ROUTINE TESTING W REFLEX): HIV SCREEN 4TH GENERATION: NONREACTIVE

## 2017-03-24 MED ORDER — HYDRALAZINE HCL 20 MG/ML IJ SOLN: 10.0000 mg | INTRAMUSCULAR | Status: DC | PRN

## 2017-03-24 MED ORDER — INSULIN ASPART 100 UNIT/ML ~~LOC~~ SOLN: 5.0000 [IU] | Freq: Once | SUBCUTANEOUS | Status: DC

## 2017-03-24 MED ORDER — ONDANSETRON HCL 4 MG/2ML IJ SOLN: 4.0000 mg | Freq: Four times a day (QID) | INTRAMUSCULAR | Status: DC | PRN

## 2017-03-24 MED ORDER — DOXYCYCLINE HYCLATE 100 MG PO TABS: 100.0000 mg | ORAL_TABLET | Freq: Two times a day (BID) | ORAL | 0 refills | Status: DC

## 2017-03-24 MED ORDER — METOPROLOL SUCCINATE ER 25 MG PO TB24
25.0000 mg | ORAL_TABLET | Freq: Every day | ORAL | Status: DC
  Administered 2017-03-24: 25 mg via ORAL
  Filled 2017-03-24: qty 1

## 2017-03-24 MED ORDER — SODIUM CHLORIDE 0.9% FLUSH
3.0000 mL | Freq: Two times a day (BID) | INTRAVENOUS | Status: DC
  Administered 2017-03-24: 3 mL via INTRAVENOUS

## 2017-03-24 MED ORDER — METOPROLOL SUCCINATE ER 25 MG PO TB24: 25.0000 mg | ORAL_TABLET | Freq: Every day | ORAL | 0 refills | Status: DC

## 2017-03-24 MED ORDER — ONDANSETRON HCL 4 MG PO TABS
4.0000 mg | ORAL_TABLET | Freq: Four times a day (QID) | ORAL | Status: DC | PRN
Start: 2017-03-24 — End: 2017-03-24

## 2017-03-24 MED ORDER — INSULIN ASPART 100 UNIT/ML ~~LOC~~ SOLN: 0.0000 [IU] | Freq: Every day | SUBCUTANEOUS | Status: DC

## 2017-03-24 MED ORDER — AMLODIPINE BESYLATE 5 MG PO TABS
5.0000 mg | ORAL_TABLET | Freq: Every day | ORAL | Status: DC
  Filled 2017-03-24: qty 1

## 2017-03-24 MED ORDER — INSULIN ASPART 100 UNIT/ML ~~LOC~~ SOLN: 0.0000 [IU] | Freq: Three times a day (TID) | SUBCUTANEOUS | Status: DC

## 2017-03-24 MED ORDER — ENOXAPARIN SODIUM 40 MG/0.4ML ~~LOC~~ SOLN: 40.0000 mg | SUBCUTANEOUS | Status: DC

## 2017-03-24 MED ORDER — ACETAMINOPHEN 650 MG RE SUPP: 650.0000 mg | Freq: Four times a day (QID) | RECTAL | Status: DC | PRN

## 2017-03-24 MED ORDER — SODIUM CHLORIDE 0.9 % IV SOLN
INTRAVENOUS | Status: DC
  Administered 2017-03-24: 03:00:00 via INTRAVENOUS

## 2017-03-24 MED ORDER — DOXYCYCLINE HYCLATE 100 MG IV SOLR
100.0000 mg | Freq: Two times a day (BID) | INTRAVENOUS | Status: DC
  Administered 2017-03-24 (×2): 100 mg via INTRAVENOUS
  Filled 2017-03-24 (×3): qty 100

## 2017-03-24 MED ORDER — ACETAMINOPHEN 325 MG PO TABS: 650.0000 mg | ORAL_TABLET | Freq: Four times a day (QID) | ORAL | Status: DC | PRN

## 2017-03-24 MED ORDER — ALBUTEROL SULFATE (2.5 MG/3ML) 0.083% IN NEBU: 2.5000 mg | INHALATION_SOLUTION | RESPIRATORY_TRACT | Status: DC | PRN

## 2017-03-24 MED ORDER — DOXYCYCLINE HYCLATE 100 MG PO TABS: 100.0000 mg | ORAL_TABLET | Freq: Two times a day (BID) | ORAL | Status: DC

## 2017-03-24 NOTE — Discharge Summary (Signed)
Physician Discharge Summary  Jill [name] CWC:[id number] DOB: 1956-11-23 DOA: 03/23/2017  PCP: Odette Fraction, MD  Admit date: 03/23/2017 Discharge date: 03/24/2017  Admitted From: Home Disposition:  Home  Recommendations for Outpatient Follow-up:  1. Follow up with PCP in 1Week. Patient is being discharged on 7 day course of oral doxycycline. Please follow lab results for Lyme titer and RMSF. 2. Cardiology will arrange outpatient follow-up.  Home Health: None Equipment/Devices: None  Discharge Condition: Fair CODE STATUS: Full code Diet recommendation: Low sodium    Discharge Diagnoses:  Principal Problem:   Hypertensive urgency   Active Problems:   Sinusitis   Leukocytosis   Headache   Hyperglycemia   Atrial tachycardia, paroxysmal (HCC)  Brief narrative/history of present illness Please refer to admission H&P for details, in brief, 61 year old female with prediabetes,  pregnancy-induced hypertension and palpitations for almost 1 year presented from urgent care with elevated blood pressure and abnormal EKG. Patient was seen by her PCP 2 weeks back and had complained of palpitations. 24-hour Holter monitoring was done (results are not yet available per PCP as per my discussion with him today). She had cough with congestion and went to the urgent care on 4/22 which was diagnosed with bronchitis and started on oral prednisone, cough medicine and albuterol inhaler. She took some cough medicine and reportedly developed acute onset of stabbing pain in the back of her head and sensation of her throat closing up. On the day of admission after taking her prednisone she had nausea with vomiting, headache and worsening dyspnea for which she went back to the urgent care. There she was found to have systolic blood pressure in the 200s and? Abnormal EKG for which she was told to come to the ED. She denies any sick contact or recent travel. She reports having take bite in her lower back  and buttocks 2 weeks back.  On 03/10/17 she was seen at her PCP with complaints of tachycardia that she usually experiences with walking her dog, lasts several minutes and resolving spontaneously without associated chest pain, syncope or dyspnea. Given concerns for SVT labs were checked which were normal and her 24-hour Holter monitoring was done (results not available to PCP yet)..   Course in the ED Patient's blood pressure was elevated to 210/93 mmHg, normal heart rate, afebrile and normal O2 sat. Blood work showed WBC of 20.5 K, creatinine 1.19 and glucose of 313. She was given Solu-Cortef and Benadryl for CT angiogram of the chest (history of contrast allergy. The CT was negative for aortic dissection or acute lung abnormality. EKG showed normal sinus rhythm with prolonged QTC. She was given 10 mg of IV hydralazine and 10 mg IV labetalol after which blood pressure improved and observe on telemetry.  Hospital course Principal Problem:   Hypertensive urgency New finding. Reports a blood pressure monitoring at PCP office have been normal previously. Improved today. Added Toprol.   Active Problems: Palpitations with paroxysmal atrial tachycardia Started on low-dose metoprolol xl for blood pressure and heart rate control. 2-D echo shows vigorous LV function with EF of 65-70%, normal wall motion, mild LVH, grade 2 diastolic dysfunction and mild left atrial enlargement. . 24-hour Holter monitoring done as outpatient recently, results not available yet. -Cardiology consult appreciated. Suspect this is likely paroxysmal atrial tachycardia. -Recommends patient is stable for discharge on low dose beta blocker for heart rate and blood pressure control. They will arrange outpatient follow-up in 2 weeks and will review results of her recent 75  hour Holter monitoring.   Acute sinusitis As seen on CT head. Started on empiric doxycycline. Continue when necessary Robitussin. Discontinue  steroid.   ? Tick bite Noted for 3 small lesions in the lower back and right buttocks. Patient informs going out in her barn frequently and her dog being diagnosed of lyme ds 2 years back. No other rash or joint pain noted. Check Lyme and RMSF titer. On empiric doxycycline for a seven-day course. Patient will follow-up with her PCP by then and results for Lyme and RMSF titer sent today will be reviewed.  Leukocytosis Possibly due to prednisone. I'll as outpatient.  Prolonged QTC (533) Resolved on repeat EKG this morning.  Prediabetes Elevated CBG possibly secondary to steroid. Follow as outpatient.   Family Communication  : None at bedside  Disposition Plan  : Home  Consults  :  Cardiology  Procedures  :  CT angiogram of the chest  head CT  2-D echo   Discharge Instructions   Allergies as of 03/24/2017      Reactions   Codeine Nausea Only   Dizziness   Tessalon [benzonatate] Other (See Comments)   Per pt this mixed with Tessalon made her throat close up   Valium [diazepam] Nausea Only   Dizziness   Zyrtec [cetirizine] Other (See Comments)   Per pt this mixed with Tessalon made her throat close up   Penicillins Rash      Medication List    STOP taking these medications   valACYclovir 1000 MG tablet Commonly known as:  VALTREX     TAKE these medications   cholecalciferol 1000 units tablet Commonly known as:  VITAMIN D Take 1,000 Units by mouth daily.   doxycycline 100 MG tablet Commonly known as:  VIBRA-TABS Take 1 tablet (100 mg total) by mouth 2 (two) times daily.   metoprolol succinate 25 MG 24 hr tablet Commonly known as:  TOPROL-XL Take 1 tablet (25 mg total) by mouth daily. Start taking on:  03/25/2017   multivitamin with minerals Tabs tablet Take 1 tablet by mouth daily.      Follow-up Information    PICKARD,WARREN TOM, MD Follow up in 1 week(s).   Specialty:  Family Medicine Contact information: [address] [place] [phone]        Ena Dawley, MD Follow up in 2 week(s).   Specialty:  Cardiology Why:  office will call for appt Contact information: [place] [phone]          Allergies  Allergen Reactions  . Codeine Nausea Only    Dizziness  . Tessalon [Benzonatate] Other (See Comments)    Per pt this mixed with Tessalon made her throat close up  . Valium [Diazepam] Nausea Only    Dizziness  . Zyrtec [Cetirizine] Other (See Comments)    Per pt this mixed with Tessalon made her throat close up  . Penicillins Rash      Procedures/Studies: Dg Chest 2 View  Result Date: 03/23/2017 CLINICAL DATA:  Tachycardia. Nausea and vomiting. Headache and cough. EXAM: CHEST  2 VIEW COMPARISON:  None. FINDINGS: Artifact overlies chest. Heart size is normal. Mediastinal shadows are normal. The lungs are clear. No effusions. Minimal spinal curvature. No acute bone finding. IMPRESSION: No active cardiopulmonary disease. Electronically Signed   By: Nelson Chimes M.D.   On: 03/23/2017 20:37   Ct Head Wo Contrast  Result Date: 03/23/2017 CLINICAL DATA:  Intermittent  severe headache.  Hypertension. EXAM: CT HEAD WITHOUT CONTRAST TECHNIQUE: Contiguous axial images were obtained from the base of the skull through the vertex without intravenous contrast. COMPARISON:  None. FINDINGS: BRAIN: No intraparenchymal hemorrhage, mass effect nor midline shift. The ventricles and sulci are normal. No acute large vascular territory infarcts. No abnormal extra-axial fluid collections. Basal cisterns are patent. VASCULAR: Unremarkable. SKULL/SOFT TISSUES: No skull fracture. Osteopenia. No significant soft tissue swelling. ORBITS/SINUSES: The included ocular globes and orbital contents are normal.Moderate paranasal sinus mucosal thickening. Mastoid air cells are well aerated. OTHER: None. IMPRESSION: Normal CT HEAD. Moderate paranasal sinusitis. Electronically Signed    By: Elon Alas M.D.   On: 03/23/2017 21:11   Ct Angio Chest/abd/pel For Dissection W And/or Wo Contrast  Result Date: 03/24/2017 CLINICAL DATA:  Acute onset of palpitations and severe headache. High blood pressure. Vomiting. Nausea and dizziness. Initial encounter. EXAM: CT ANGIOGRAPHY CHEST, ABDOMEN AND PELVIS TECHNIQUE: Multidetector CT imaging through the chest, abdomen and pelvis was performed using the standard protocol during bolus administration of intravenous contrast. Multiplanar reconstructed images and MIPs were obtained and reviewed to evaluate the vascular anatomy. CONTRAST:  100 mL of Isovue 370 IV contrast COMPARISON:  None. FINDINGS: CTA CHEST FINDINGS Cardiovascular: There is no evidence of aortic dissection. There is no evidence of aneurysmal dilatation. Minimal calcification is noted along the aortic arch. The great vessels are grossly unremarkable in appearance. There is no evidence of significant pulmonary embolus. The heart is normal in size. The thoracic aorta is otherwise unremarkable in appearance. Mediastinum/Nodes: The mediastinum is unremarkable in appearance. No mediastinal lymphadenopathy is seen. No pericardial effusion is identified. The visualized portions of the thyroid gland are unremarkable. No axillary lymphadenopathy is seen. Lungs/Pleura: Hazy bilateral lower lobe airspace opacities, right greater than left, raise concern for multifocal pneumonia. There is trace opacification of bronchioles to both lower lung lobes, raising question for mild aspiration. No pleural effusion or pneumothorax is seen. No dominant mass is identified. Musculoskeletal: No acute osseous abnormalities are identified. The visualized musculature is unremarkable in appearance. Review of the MIP images confirms the above findings. CTA ABDOMEN AND PELVIS FINDINGS VASCULAR Aorta: There is no evidence of aortic dissection. There is no evidence of aneurysmal dilatation. Mild scattered calcification  is seen along the abdominal aorta. Celiac: The celiac trunk is unremarkable in appearance. SMA: The superior mesenteric artery appears fully patent. Renals: The renal arteries appear intact bilaterally. IMA: The inferior mesenteric artery is unremarkable in appearance. Inflow: The common, internal and external iliac arteries appear fully patent bilaterally. The common femoral arteries and their proximal branches are unremarkable in appearance. Veins: The visualized venous structures are grossly unremarkable. The inferior vena cava is within normal limits. Review of the MIP images confirms the above findings. NON-VASCULAR Hepatobiliary: The liver is grossly unremarkable in appearance. Trace pericholecystic fluid is noted. The gallbladder is otherwise grossly unremarkable. The common bile duct is distended to 1.0 cm in diameter, raising concern for distal obstruction. Pancreas: The pancreas is within normal limits. Spleen: The spleen is unremarkable in appearance. Adrenals/Urinary Tract: A 2.8 cm right adrenal nodule is noted, not well characterized on this study. The left adrenal gland is unremarkable in appearance. A tiny left renal cyst is suggested. There is no evidence of hydronephrosis. No renal or ureteral stones are identified. No perinephric stranding is seen. Stomach/Bowel: The stomach is unremarkable in appearance. The small bowel is within normal limits. The appendix is normal in caliber, without evidence of appendicitis. The colon is  unremarkable in appearance. Lymphatic: No retroperitoneal or pelvic sidewall lymphadenopathy is seen. Reproductive: The bladder is moderately distended and grossly unremarkable. The uterus is unremarkable in appearance. The ovaries are relatively symmetric. No suspicious adnexal masses are seen. Other: No additional soft tissue abnormalities are seen. Musculoskeletal: No acute osseous abnormalities are identified. The visualized musculature is unremarkable in appearance.  Review of the MIP images confirms the above findings. IMPRESSION: 1. No evidence of aortic dissection. No evidence of aneurysmal dilatation. 2. No evidence of significant pulmonary embolus. 3. Dilatation of the common bile duct to 1.0 cm in diameter, with trace pericholecystic fluid. This raises concern for distal obstruction. Would correlate with LFTs. MRCP or ERCP could be considered for further evaluation, as deemed clinically appropriate. 4. Hazy bilateral lower lobe airspace opacities, right greater than left, raise concern for multifocal pneumonia. Trace opacification of bronchioles to both lower lung lobes. Given the patient's recent vomiting, this is concerning for mild aspiration. 5. **An incidental finding of potential clinical significance has been found. 2.8 cm right adrenal nodule noted. Would correlate with adrenal labs, and consider adrenal protocol MRI or CT for further evaluation, when and as deemed clinically appropriate.** 6. Mild aortic atherosclerosis. 7. Tiny left renal cyst suggested. Electronically Signed   By: Garald Balding M.D.   On: 03/24/2017 01:04    2-D echo    Subjective: Reports feeling much better. Cough improving. Denies headache  Discharge Exam: Vitals:   03/24/17 0951 03/24/17 1510  BP: (!) 155/60 (!) 152/58  Pulse:  (!) 59  Resp:  18  Temp:     Vitals:   03/24/17 0213 03/24/17 0535 03/24/17 0951 03/24/17 1510  BP: (!) 162/68 (!) 172/74 (!) 155/60 (!) 152/58  Pulse: 68   (!) 59  Resp:  18  18  Temp: 98.9 F (37.2 C) 97.6 F (36.4 C)    TempSrc: Oral Oral    SpO2: 98% 96%  98%  Weight: 58.5 kg (128 lb 14.4 oz) 61.1 kg (134 lb 9.6 oz)    Height: 5\' 3"  (1.6 m)       Gen: not in distress HEENT:  moist mucosa, supple neck. Flushed face, no sinus tenderness Chest: clear b/l, no added sounds CVS: N S1&S2, no murmurs, rubs or gallop GI: soft, NT, ND,  Musculoskeletal: warm, no edema, tick bite mark over lower back and right buttocks      The  results of significant diagnostics from this hospitalization (including imaging, microbiology, ancillary and laboratory) are listed below for reference.     Microbiology: No results found for this or any previous visit (from the past 240 hour(s)).   Labs: BNP (last 3 results) No results for input(s): BNP in the last 8760 hours. Basic Metabolic Panel:  Recent Labs Lab 03/23/17 2129 03/23/17 2155 03/24/17 0225  NA 139 139 141  K 4.5 4.5 4.3  CL 106 106 108  CO2 22  --  23  GLUCOSE 313* 315* 219*  BUN 19 22* 17  CREATININE 1.19* 1.00 0.93  CALCIUM 9.3  --  9.4   Liver Function Tests:  Recent Labs Lab 03/23/17 2129  AST 33  ALT 26  ALKPHOS 76  BILITOT 0.8  PROT 7.0  ALBUMIN 3.8    Recent Labs Lab 03/23/17 2129  LIPASE 22   No results for input(s): AMMONIA in the last 168 hours. CBC:  Recent Labs Lab 03/23/17 2129 03/23/17 2155 03/24/17 0225  WBC 20.5*  --  22.9*  NEUTROABS 18.3*  --   --  HGB 13.7 13.9 13.6  HCT 41.1 41.0 40.6  MCV 80.6  --  79.8  PLT 375  --  334   Cardiac Enzymes: No results for input(s): CKTOTAL, CKMB, CKMBINDEX, TROPONINI in the last 168 hours. BNP: Invalid input(s): POCBNP CBG:  Recent Labs Lab 03/24/17 0247 03/24/17 0846  GLUCAP 195* 208*   D-Dimer No results for input(s): DDIMER in the last 72 hours. Hgb A1c No results for input(s): HGBA1C in the last 72 hours. Lipid Profile No results for input(s): CHOL, HDL, LDLCALC, TRIG, CHOLHDL, LDLDIRECT in the last 72 hours. Thyroid function studies  Recent Labs  03/24/17 0225  TSH 0.594   Anemia work up No results for input(s): VITAMINB12, FOLATE, FERRITIN, TIBC, IRON, RETICCTPCT in the last 72 hours. Urinalysis    Component Value Date/Time   COLORURINE YELLOW 03/24/2017 0029   APPEARANCEUR CLEAR 03/24/2017 0029   LABSPEC 1.011 03/24/2017 0029   PHURINE 5.0 03/24/2017 0029   GLUCOSEU >=500 (A) 03/24/2017 0029   HGBUR NEGATIVE 03/24/2017 0029   BILIRUBINUR  NEGATIVE 03/24/2017 0029   KETONESUR NEGATIVE 03/24/2017 0029   PROTEINUR NEGATIVE 03/24/2017 0029   NITRITE NEGATIVE 03/24/2017 0029   LEUKOCYTESUR NEGATIVE 03/24/2017 0029   Sepsis Labs Invalid input(s): PROCALCITONIN,  WBC,  LACTICIDVEN Microbiology No results found for this or any previous visit (from the past 240 hour(s)).   Time coordinating discharge: < 30 minutes  SIGNED:   Louellen Molder, MD  [facility] 03/24/2017, 5:09 PM Pager   If 7PM-7AM, please contact night-coverage [email or web address] Password [email or web address]

## 2017-03-24 NOTE — Consult Note (Signed)
Cardiology Consult    Patient ID: Jill [name] MRN: [id number], DOB/AGE: [birth year]   Admit date: 03/23/2017 Date of Consult: 03/24/2017  Primary Physician: Odette Fraction, MD Reason for Consult: Abnormal EKG Primary Cardiologist: New Requesting Provider: Dr. [name]  History of Present Illness    Jill [name] is a 61 y.o. female who is being seen today for the evaluation of abnormal EKG at the request of Dr. [name]. The patient has a previous medical history significant for prediabetes and pregnancy induced diabetes and PIH. She presented to [facility] emergency department on 03/23/17 for evaluation of elevated blood pressure and abnormal EKG that were noted at urgent care. She denies any MI, heart failure or cardiac issues except for occasional fast heart beat.   On 4/22 the patient had presented to urgent care and was diagnosed with bronchitis and was started on prednisone, cough medicine and albuterol breathing treatment. She had a possible medication reaction after taking some cough medicine (benzonatate and certrizine) with acute onset of stabbing pain in the back of the head and feelings of her throat closing up. She called EMS however her symptoms resolved and she declined transport. On 4/23 she noted onset of nausea with vomiting, headache, and worsening shortness of breath after taking her prednisone. She again presented to the urgent care where she was noted to have systolic blood pressure in the 200s and EKG with abnormal findings. She had pregnancy-induced hypertension but has not been treated for hypertension since.  On 03/10/17 she was seen at her PCP with complaints of tachycardia that she usually experiences with walking her dog, lasts several minutes and spontaneously resolves. She denied any associated syncope, chest pain or shortness of breath. There was concern for possible SVT. Labs were checked which came back normal and a Holter monitor was ordered to evaluate for  cardiac arrhythmias. A 24 hour Holter monitor was placed on 03/18/17 and removed on 03/19/17. The results are not available as of yet.  She has noted the episodes of fast heart beat mostly with activity and occurring about twice per week since last summer after about of heat exhaustion. It is sometimes accompanied by nausea and mild shortness of breath. It sometimes resolves spontaneously and sometimes with rest and drinking cold water. She had been taking a multi symptom cold medication on a daily basis for about a week but did not note any increase in her symptoms of fast heart beat.   She has never smoked and drinks an occasional [place], about once a month or less.   Past Medical History   Past Medical History:  Diagnosis Date  . Allergy   . Colon polyp     History reviewed. No pertinent surgical history.   Allergies  Allergies  Allergen Reactions  . Codeine Nausea Only    Dizziness  . Tessalon [Benzonatate] Other (See Comments)    Per pt this mixed with Tessalon made her throat close up  . Valium [Diazepam] Nausea Only    Dizziness  . Zyrtec [Cetirizine] Other (See Comments)    Per pt this mixed with Tessalon made her throat close up  . Penicillins Rash    Inpatient Medications    . enoxaparin (LOVENOX) injection  40 mg Subcutaneous Q24H  . insulin aspart  0-5 Units Subcutaneous QHS  . insulin aspart  0-9 Units Subcutaneous TID WC  . metoprolol succinate  25 mg Oral Daily  . sodium chloride flush  3 mL Intravenous Q12H  Family History    Family History  Problem Relation Age of Onset  . Dementia Mother   . Diabetes Father   . Heart disease Father   . Hypertension Father   . Cancer Sister     endometrial  . Cancer Maternal Aunt     ovarian     Social History    Social History   Social History  . Marital status: Married    Spouse name: N/A  . Number of children: N/A  . Years of education: N/A   Occupational History  . Not on file.   Social History  Main Topics  . Smoking status: Never Smoker  . Smokeless tobacco: Never Used  . Alcohol use Yes     Comment: occasional  . Drug use: No  . Sexual activity: Not on file   Other Topics Concern  . Not on file   Social History Narrative  . No narrative on file     Review of Systems   General:  No chills, fever, night sweats or weight changes.  Cardiovascular:  No chest pain, dyspnea on exertion, edema, orthopnea, palpitations, paroxysmal nocturnal dyspnea. Dermatological: No rash, lesions/masses Respiratory: Occ cough and wheeze, No dyspnea Urologic: No hematuria, dysuria Abdominal:   No nausea, vomiting, diarrhea, bright red blood per rectum, melena, or hematemesis Neurologic:  No visual changes, wkns, changes in mental status. All other systems reviewed and are otherwise negative except as noted above.  Physical Exam    Blood pressure (!) 152/58, pulse (!) 59, temperature 97.6 F (36.4 C), temperature source Oral, resp. rate 18, height 5\' 3"  (1.6 m), weight 134 lb 9.6 oz (61.1 kg), SpO2 98 %.  General: Pleasant, NAD Psych: Normal affect. Neuro: Alert and oriented X 3. Moves all extremities spontaneously. HEENT: Normal  Neck: Supple without bruits or JVD. Lungs:  Resp regular and unlabored, CTA except for high pitched wheeze that cleared with cough Heart: RRR no s3, s4, or murmurs. Abdomen: Soft, non-tender, non-distended, BS + x 4.  Extremities: No clubbing, cyanosis or edema. DP/PT/Radials 2+ and equal bilaterally.  Labs    Troponin [facility] Test)  Recent Labs  03/23/17 2153  TROPIPOC 0.02   No results for input(s): CKTOTAL, CKMB, TROPONINI in the last 72 hours. Lab Results  Component Value Date   WBC 22.9 (H) 03/24/2017   HGB 13.6 03/24/2017   HCT 40.6 03/24/2017   MCV 79.8 03/24/2017   PLT 334 03/24/2017    Recent Labs Lab 03/23/17 2129  03/24/17 0225  NA 139  < > 141  K 4.5  < > 4.3  CL 106  < > 108  CO2 22  --  23  BUN 19  < > 17  CREATININE  1.19*  < > 0.93  CALCIUM 9.3  --  9.4  PROT 7.0  --   --   BILITOT 0.8  --   --   ALKPHOS 76  --   --   ALT 26  --   --   AST 33  --   --   GLUCOSE 313*  < > 219*  < > = values in this interval not displayed. Lab Results  Component Value Date   CHOL 266 (H) 10/29/2016   HDL 65 10/29/2016   LDLCALC 161 (H) 10/29/2016   TRIG 199 (H) 10/29/2016   No results found for: [facility]   Radiology Studies    Dg Chest 2 View  Result Date: 03/23/2017 CLINICAL DATA:  Tachycardia.  Nausea and vomiting. Headache and cough. EXAM: CHEST  2 VIEW COMPARISON:  None. FINDINGS: Artifact overlies chest. Heart size is normal. Mediastinal shadows are normal. The lungs are clear. No effusions. Minimal spinal curvature. No acute bone finding. IMPRESSION: No active cardiopulmonary disease. Electronically Signed   By: Jerlyn Pain Chimes M.D.   On: 03/23/2017 20:37   Ct Head Wo Contrast  Result Date: 03/23/2017 CLINICAL DATA:  Intermittent severe headache.  Hypertension. EXAM: CT HEAD WITHOUT CONTRAST TECHNIQUE: Contiguous axial images were obtained from the base of the skull through the vertex without intravenous contrast. COMPARISON:  None. FINDINGS: BRAIN: No intraparenchymal hemorrhage, mass effect nor midline shift. The ventricles and sulci are normal. No acute large vascular territory infarcts. No abnormal extra-axial fluid collections. Basal cisterns are patent. VASCULAR: Unremarkable. SKULL/SOFT TISSUES: No skull fracture. Osteopenia. No significant soft tissue swelling. ORBITS/SINUSES: The included ocular globes and orbital contents are normal.Moderate paranasal sinus mucosal thickening. Mastoid air cells are well aerated. OTHER: None. IMPRESSION: Normal CT HEAD. Moderate paranasal sinusitis. Electronically Signed   By: Elon Alas M.D.   On: 03/23/2017 21:11   Ct Angio Chest/abd/pel For Dissection W And/or Wo Contrast  Result Date: 03/24/2017 CLINICAL DATA:  Acute onset of palpitations and severe headache. High  blood pressure. Vomiting. Nausea and dizziness. Initial encounter. EXAM: CT ANGIOGRAPHY CHEST, ABDOMEN AND PELVIS TECHNIQUE: Multidetector CT imaging through the chest, abdomen and pelvis was performed using the standard protocol during bolus administration of intravenous contrast. Multiplanar reconstructed images and MIPs were obtained and reviewed to evaluate the vascular anatomy. CONTRAST:  100 mL of Isovue 370 IV contrast COMPARISON:  None. FINDINGS: CTA CHEST FINDINGS Cardiovascular: There is no evidence of aortic dissection. There is no evidence of aneurysmal dilatation. Minimal calcification is noted along the aortic arch. The great vessels are grossly unremarkable in appearance. There is no evidence of significant pulmonary embolus. The heart is normal in size. The thoracic aorta is otherwise unremarkable in appearance. Mediastinum/Nodes: The mediastinum is unremarkable in appearance. No mediastinal lymphadenopathy is seen. No pericardial effusion is identified. The visualized portions of the thyroid gland are unremarkable. No axillary lymphadenopathy is seen. Lungs/Pleura: Hazy bilateral lower lobe airspace opacities, right greater than left, raise concern for multifocal pneumonia. There is trace opacification of bronchioles to both lower lung lobes, raising question for mild aspiration. No pleural effusion or pneumothorax is seen. No dominant mass is identified. Musculoskeletal: No acute osseous abnormalities are identified. The visualized musculature is unremarkable in appearance. Review of the MIP images confirms the above findings. CTA ABDOMEN AND PELVIS FINDINGS VASCULAR Aorta: There is no evidence of aortic dissection. There is no evidence of aneurysmal dilatation. Mild scattered calcification is seen along the abdominal aorta. Celiac: The celiac trunk is unremarkable in appearance. SMA: The superior mesenteric artery appears fully patent. Renals: The renal arteries appear intact bilaterally. IMA: The  inferior mesenteric artery is unremarkable in appearance. Inflow: The common, internal and external iliac arteries appear fully patent bilaterally. The common femoral arteries and their proximal branches are unremarkable in appearance. Veins: The visualized venous structures are grossly unremarkable. The inferior vena cava is within normal limits. Review of the MIP images confirms the above findings. NON-VASCULAR Hepatobiliary: The liver is grossly unremarkable in appearance. Trace pericholecystic fluid is noted. The gallbladder is otherwise grossly unremarkable. The common bile duct is distended to 1.0 cm in diameter, raising concern for distal obstruction. Pancreas: The pancreas is within normal limits. Spleen: The spleen is unremarkable in appearance. Adrenals/Urinary Tract: A 2.8  cm right adrenal nodule is noted, not well characterized on this study. The left adrenal gland is unremarkable in appearance. A tiny left renal cyst is suggested. There is no evidence of hydronephrosis. No renal or ureteral stones are identified. No perinephric stranding is seen. Stomach/Bowel: The stomach is unremarkable in appearance. The small bowel is within normal limits. The appendix is normal in caliber, without evidence of appendicitis. The colon is unremarkable in appearance. Lymphatic: No retroperitoneal or pelvic sidewall lymphadenopathy is seen. Reproductive: The bladder is moderately distended and grossly unremarkable. The uterus is unremarkable in appearance. The ovaries are relatively symmetric. No suspicious adnexal masses are seen. Other: No additional soft tissue abnormalities are seen. Musculoskeletal: No acute osseous abnormalities are identified. The visualized musculature is unremarkable in appearance. Review of the MIP images confirms the above findings. IMPRESSION: 1. No evidence of aortic dissection. No evidence of aneurysmal dilatation. 2. No evidence of significant pulmonary embolus. 3. Dilatation of the  common bile duct to 1.0 cm in diameter, with trace pericholecystic fluid. This raises concern for distal obstruction. Would correlate with LFTs. MRCP or ERCP could be considered for further evaluation, as deemed clinically appropriate. 4. Hazy bilateral lower lobe airspace opacities, right greater than left, raise concern for multifocal pneumonia. Trace opacification of bronchioles to both lower lung lobes. Given the patient's recent vomiting, this is concerning for mild aspiration. 5. **An incidental finding of potential clinical significance has been found. 2.8 cm right adrenal nodule noted. Would correlate with adrenal labs, and consider adrenal protocol MRI or CT for further evaluation, when and as deemed clinically appropriate.** 6. Mild aortic atherosclerosis. 7. Tiny left renal cyst suggested. Electronically Signed   By: Garald Balding M.D.   On: 03/24/2017 01:04    EKG & Cardiac Imaging   EKG:  03/23/17- sinus rhythm versus ectopic atrial rhythm with inverted P waves, 83 bpm, LVH, QTC 533 03/24/17- sinus rhythm versus ectopic atrial rhythm (no longer with inverted P waves, however they may have an abnormal focus), 65 PPM, left posterior fascicular block, nonspecific ST changes, QTC 443  Echocardiogram: pending  Assessment & Plan    Abnormal EKG and pt complaints of fast heart beat -EKG shows possible ectopic atrial rhythm. Pt with recent complaints of tachycardia with occ nausea, no CP, shortness of breath, dizziness or syncope. Holter monitor placed per PCP 4/18-4/19, concerned for possible SVT. Results not back yet per office staff. Telemetry here shows sinus rhythm vs ectopic atrial rhythm with intermittent bursts of narrow complex regular tachycardia- possibly ectopic atrial tachycardia. Baseline heart rate is in the 60's.  -TSH is WNL. Electrolytes kidney function are normal. Glucose I high ( possibly  -Consider adding beta blocker for control of bursts of tachycardia- Metoprolol succinate  25 mg initiated today.  -Echo has been ordered.   Hypertension -Pt with history of pregnancy induced HTN, but no HTN since.  -SBP over 200 at PCP office on presentation. Was being treated with cough med, prednisone and bronchodilators for bronchitits and had possible medication reaction.  -BP at PCP office 03/10/17 140/82, BP in office 10/31/16 120/68 -She was given hydralazine 10 mg and labetalol 10 mg IV.  -BP still elevated. May be related to current sinusitis/bronchtits. -Continue to monitor. Will check echo for LV function, LVH, wall motion. Consider adding a low dose antihypertensive. Could add beta blocker that would also help suppress tachycardia.   Prolonged QTC -QTC was 533 on 4/23, QTC 443 today. Avoid QT prolonging medications.   Signed, Sherlyn Hay  Phylliss Bob, NP-C 03/24/2017, 4:45 PM Pager: [phone]  The patient was seen, examined and discussed with Daune Perch, NP-C and I agree with the above.   Jill [name] is a 60 y.o. female who is being seen today for the evaluation of abnormal EKG at the request of Dr. [name]. The patient has a previous medical history significant for prediabetes and pregnancy induced diabetes and PIH. She presented to [facility] emergency department on 03/23/17 for evaluation of elevated blood pressure and abnormal EKG that were noted at urgent care. She denies any MI, heart failure or cardiac issues except for occasional fast heart beat.   On 4/22 the patient had presented to urgent care and was diagnosed with bronchitis and was started on prednisone, cough medicine and albuterol breathing treatment. She had a possible medication reaction after taking some cough medicine (benzonatate and certrizine) with acute onset of stabbing pain in the back of the head and feelings of her throat closing up. She called EMS however her symptoms resolved and she declined transport. On 03/10/17 she was seen at her PCP with complaints of tachycardia that she usually  experiences with walking her dog, lasts several minutes and spontaneously resolves. She denied any associated syncope, chest pain or shortness of breath.   ECG shows ectopic atrial rhythm with ventricular rate 83 BPM. Telemetry shows short runs of atrial tachycardia with up to 11 beats. She is on no medication. Echocardiogram showed hyperdynamic LVEF, grade 2 DD, no significant valvular abnormalities. We will start metoprolol 25 mg po BID, follow up in the clinic in 7-10 days, if she persists in atrial thythm or has significant palpitations, we will refer to EP.    Ena Dawley, MD 03/24/2017

## 2017-03-24 NOTE — Telephone Encounter (Signed)
Allergy list updated per pt.

## 2017-03-24 NOTE — Progress Notes (Addendum)
PROGRESS NOTE                                                                                                                                                                                                             Patient Demographics:    Jill [name], is a 61 y.o. female, DOB - [birth year], JYN:[id number]  Admit date - 03/23/2017   Admitting Physician Norval Morton, MD  Outpatient Primary MD for the patient is [facility] TOM, MD  LOS - 0  Outpatient Specialists: none  Chief Complaint  Patient presents with  . Abnormal ECG  . Hypertension       Brief Narrative   61 year old female with prediabetes and palpitations for almost 1 year presented from urgent care with elevated blood pressure and abnormal EKG. Patient was seen by her PCP 2 weeks back and had complained of palpitations. 24-hour Holter monitoring was done (results are not yet available per PCP as per my discussion with him today). She had cough with congestion and went to the urgent care on 4/22 which was diagnosed with bronchitis and started on oral prednisone, cough medicine and albuterol inhaler. She took some cough medicine and reportedly developed acute onset of stabbing pain in the back of her head and sensation of her throat closing up. On the day of admission after taking her prednisone she had nausea with vomiting, headache and worsening dyspnea for which she went back to the urgent care. There she was found to have systolic blood pressure in the 200s and? Abnormal EKG for which she was told to come to the ED. She denies any sick contact or recent travel. She reports having take bite in her lower back and buttocks 2 weeks back.  Course in the ED Patient's blood pressure was elevated to 210/93 mmHg, normal heart rate, afebrile and normal O2 sat. Blood work showed WBC of 20.5 K, creatinine 1.19 and glucose of 313. She was given Solu-Cortef and Benadryl  for CT angiogram of the chest (history of contrast allergy. The CT was negative for aortic dissection or acute lung abnormality. She was given 10 mg of IV hydralazine and 10 mg IV labetalol after which blood pressure improved and observe on telemetry.   Subjective:   Patient reports her cough and headache to be better. Episodes of SVT on the monitor.  Assessment  & Plan :    Principal Problem:   Hypertensive urgency New finding. Reports a blood pressure monitoring at PCP office have been normal previously.   Active Problems: Palpitations with SVTs Started on low-dose metoprolol xl for blood pressure and heart rate control. Pending 2-D echo. 24-hour Holter monitoring done as outpatient recently, results not available yet. -Cardiology consulted and pending recommendations.   Acute sinusitis As seen on CT head. Started on empiric doxycycline. Continue when necessary Robitussin. Discontinue steroid.   ? Tick bite Noted for 3 small lesions in the lower back and right buttocks. Patient informs going out in her barn frequently and her dog being diagnosed of lyme ds 2 years back. No other rash or joint pain noted. Check Lyme and RMSF titer. On empiric doxycycline.  Leukocytosis Possibly due to prednisone. Monitor.  Prolonged QTC (533) Resolved on repeat EKG this morning.    Code Status : Full code  Family Communication  : None at bedside  Disposition Plan  : Home pending 2-D echo results and cardiology eval.  Barriers For Discharge : Pending results and improvement in blood pressure  Consults  :  Cardiology  Procedures  :  CT angiogram of the chest  head CT  2-D echo  DVT Prophylaxis  :  Lovenox -   Lab Results  Component Value Date   PLT 334 03/24/2017    Antibiotics  :    Anti-infectives    Start     Dose/Rate Route Frequency Ordered Stop   03/24/17 0200  doxycycline (VIBRAMYCIN) 100 mg in dextrose 5 % 250 mL IVPB     100 mg 125 mL/hr over 120 Minutes  Intravenous Every 12 hours 03/24/17 0145     03/24/17 0130  doxycycline (VIBRA-TABS) tablet 100 mg  Status:  Discontinued     100 mg Oral Every 12 hours 03/24/17 0123 03/24/17 0145        Objective:   Vitals:   03/24/17 0213 03/24/17 0535 03/24/17 0951 03/24/17 1510  BP: (!) 162/68 (!) 172/74 (!) 155/60 (!) 152/58  Pulse: 68   (!) 59  Resp:  18  18  Temp: 98.9 F (37.2 C) 97.6 F (36.4 C)    TempSrc: Oral Oral    SpO2: 98% 96%  98%  Weight: 58.5 kg (128 lb 14.4 oz) 61.1 kg (134 lb 9.6 oz)    Height: 5\' 3"  (1.6 m)       Wt Readings from Last 3 Encounters:  03/24/17 61.1 kg (134 lb 9.6 oz)  03/10/17 63 kg (139 lb)  10/31/16 63.5 kg (140 lb)     Intake/Output Summary (Last 24 hours) at 03/24/17 1644 Last data filed at 03/23/17 2148  Gross per 24 hour  Intake                0 ml  Output                0 ml  Net                0 ml     Physical Exam  Gen: not in distress HEENT:  moist mucosa, supple neck. Flushed face, no sinus tenderness Chest: clear b/l, no added sounds CVS: N S1&S2, no murmurs, rubs or gallop GI: soft, NT, ND,  Musculoskeletal: warm, no edema, tick bite mark over lower back and right buttocks     Data Review:    CBC  Recent Labs Lab 03/23/17 2129 03/23/17 2155 03/24/17 0225  WBC 20.5*  --  22.9*  HGB 13.7 13.9 13.6  HCT 41.1 41.0 40.6  PLT 375  --  334  MCV 80.6  --  79.8  MCH 26.9  --  26.7  MCHC 33.3  --  33.5  RDW 13.1  --  13.3  LYMPHSABS 1.0  --   --   MONOABS 1.2*  --   --   EOSABS 0.0  --   --   BASOSABS 0.0  --   --     Chemistries   Recent Labs Lab 03/23/17 2129 03/23/17 2155 03/24/17 0225  NA 139 139 141  K 4.5 4.5 4.3  CL 106 106 108  CO2 22  --  23  GLUCOSE 313* 315* 219*  BUN 19 22* 17  CREATININE 1.19* 1.00 0.93  CALCIUM 9.3  --  9.4  AST 33  --   --   ALT 26  --   --   ALKPHOS 76  --   --   BILITOT 0.8  --   --     ------------------------------------------------------------------------------------------------------------------ No results for input(s): CHOL, HDL, LDLCALC, TRIG, CHOLHDL, LDLDIRECT in the last 72 hours.  Lab Results  Component Value Date   HGBA1C 6.1 (H) 03/10/2017   ------------------------------------------------------------------------------------------------------------------  Recent Labs  03/24/17 0225  TSH 0.594   ------------------------------------------------------------------------------------------------------------------ No results for input(s): VITAMINB12, FOLATE, FERRITIN, TIBC, IRON, RETICCTPCT in the last 72 hours.  Coagulation profile No results for input(s): INR, PROTIME in the last 168 hours.  No results for input(s): DDIMER in the last 72 hours.  Cardiac Enzymes No results for input(s): CKMB, TROPONINI, MYOGLOBIN in the last 168 hours.  Invalid input(s): CK ------------------------------------------------------------------------------------------------------------------ No results found for: BNP  Inpatient Medications  Scheduled Meds: . enoxaparin (LOVENOX) injection  40 mg Subcutaneous Q24H  . insulin aspart  0-5 Units Subcutaneous QHS  . insulin aspart  0-9 Units Subcutaneous TID WC  . metoprolol succinate  25 mg Oral Daily  . sodium chloride flush  3 mL Intravenous Q12H   Continuous Infusions: . sodium chloride 75 mL/hr at 03/24/17 0253  . doxycycline (VIBRAMYCIN) IV 100 mg (03/24/17 1533)   PRN Meds:.acetaminophen **OR** acetaminophen, albuterol, hydrALAZINE  Micro Results No results found for this or any previous visit (from the past 240 hour(s)).  Radiology Reports Dg Chest 2 View  Result Date: 03/23/2017 CLINICAL DATA:  Tachycardia. Nausea and vomiting. Headache and cough. EXAM: CHEST  2 VIEW COMPARISON:  None. FINDINGS: Artifact overlies chest. Heart size is normal. Mediastinal shadows are normal. The lungs are clear. No effusions.  Minimal spinal curvature. No acute bone finding. IMPRESSION: No active cardiopulmonary disease. Electronically Signed   By: Nelson Chimes M.D.   On: 03/23/2017 20:37   Ct Head Wo Contrast  Result Date: 03/23/2017 CLINICAL DATA:  Intermittent severe headache.  Hypertension. EXAM: CT HEAD WITHOUT CONTRAST TECHNIQUE: Contiguous axial images were obtained from the base of the skull through the vertex without intravenous contrast. COMPARISON:  None. FINDINGS: BRAIN: No intraparenchymal hemorrhage, mass effect nor midline shift. The ventricles and sulci are normal. No acute large vascular territory infarcts. No abnormal extra-axial fluid collections. Basal cisterns are patent. VASCULAR: Unremarkable. SKULL/SOFT TISSUES: No skull fracture. Osteopenia. No significant soft tissue swelling. ORBITS/SINUSES: The included ocular globes and orbital contents are normal.Moderate paranasal sinus mucosal thickening. Mastoid air cells are well aerated. OTHER: None. IMPRESSION: Normal CT HEAD. Moderate paranasal sinusitis. Electronically Signed   By: Elon Alas M.D.   On: 03/23/2017 21:11  Ct Angio Chest/abd/pel For Dissection W And/or Wo Contrast  Result Date: 03/24/2017 CLINICAL DATA:  Acute onset of palpitations and severe headache. High blood pressure. Vomiting. Nausea and dizziness. Initial encounter. EXAM: CT ANGIOGRAPHY CHEST, ABDOMEN AND PELVIS TECHNIQUE: Multidetector CT imaging through the chest, abdomen and pelvis was performed using the standard protocol during bolus administration of intravenous contrast. Multiplanar reconstructed images and MIPs were obtained and reviewed to evaluate the vascular anatomy. CONTRAST:  100 mL of Isovue 370 IV contrast COMPARISON:  None. FINDINGS: CTA CHEST FINDINGS Cardiovascular: There is no evidence of aortic dissection. There is no evidence of aneurysmal dilatation. Minimal calcification is noted along the aortic arch. The great vessels are grossly unremarkable in  appearance. There is no evidence of significant pulmonary embolus. The heart is normal in size. The thoracic aorta is otherwise unremarkable in appearance. Mediastinum/Nodes: The mediastinum is unremarkable in appearance. No mediastinal lymphadenopathy is seen. No pericardial effusion is identified. The visualized portions of the thyroid gland are unremarkable. No axillary lymphadenopathy is seen. Lungs/Pleura: Hazy bilateral lower lobe airspace opacities, right greater than left, raise concern for multifocal pneumonia. There is trace opacification of bronchioles to both lower lung lobes, raising question for mild aspiration. No pleural effusion or pneumothorax is seen. No dominant mass is identified. Musculoskeletal: No acute osseous abnormalities are identified. The visualized musculature is unremarkable in appearance. Review of the MIP images confirms the above findings. CTA ABDOMEN AND PELVIS FINDINGS VASCULAR Aorta: There is no evidence of aortic dissection. There is no evidence of aneurysmal dilatation. Mild scattered calcification is seen along the abdominal aorta. Celiac: The celiac trunk is unremarkable in appearance. SMA: The superior mesenteric artery appears fully patent. Renals: The renal arteries appear intact bilaterally. IMA: The inferior mesenteric artery is unremarkable in appearance. Inflow: The common, internal and external iliac arteries appear fully patent bilaterally. The common femoral arteries and their proximal branches are unremarkable in appearance. Veins: The visualized venous structures are grossly unremarkable. The inferior vena cava is within normal limits. Review of the MIP images confirms the above findings. NON-VASCULAR Hepatobiliary: The liver is grossly unremarkable in appearance. Trace pericholecystic fluid is noted. The gallbladder is otherwise grossly unremarkable. The common bile duct is distended to 1.0 cm in diameter, raising concern for distal obstruction. Pancreas: The  pancreas is within normal limits. Spleen: The spleen is unremarkable in appearance. Adrenals/Urinary Tract: A 2.8 cm right adrenal nodule is noted, not well characterized on this study. The left adrenal gland is unremarkable in appearance. A tiny left renal cyst is suggested. There is no evidence of hydronephrosis. No renal or ureteral stones are identified. No perinephric stranding is seen. Stomach/Bowel: The stomach is unremarkable in appearance. The small bowel is within normal limits. The appendix is normal in caliber, without evidence of appendicitis. The colon is unremarkable in appearance. Lymphatic: No retroperitoneal or pelvic sidewall lymphadenopathy is seen. Reproductive: The bladder is moderately distended and grossly unremarkable. The uterus is unremarkable in appearance. The ovaries are relatively symmetric. No suspicious adnexal masses are seen. Other: No additional soft tissue abnormalities are seen. Musculoskeletal: No acute osseous abnormalities are identified. The visualized musculature is unremarkable in appearance. Review of the MIP images confirms the above findings. IMPRESSION: 1. No evidence of aortic dissection. No evidence of aneurysmal dilatation. 2. No evidence of significant pulmonary embolus. 3. Dilatation of the common bile duct to 1.0 cm in diameter, with trace pericholecystic fluid. This raises concern for distal obstruction. Would correlate with LFTs. MRCP or ERCP could be  considered for further evaluation, as deemed clinically appropriate. 4. Hazy bilateral lower lobe airspace opacities, right greater than left, raise concern for multifocal pneumonia. Trace opacification of bronchioles to both lower lung lobes. Given the patient's recent vomiting, this is concerning for mild aspiration. 5. **An incidental finding of potential clinical significance has been found. 2.8 cm right adrenal nodule noted. Would correlate with adrenal labs, and consider adrenal protocol MRI or CT for  further evaluation, when and as deemed clinically appropriate.** 6. Mild aortic atherosclerosis. 7. Tiny left renal cyst suggested. Electronically Signed   By: Garald Balding M.D.   On: 03/24/2017 01:04    Time Spent in minutes  20   Louellen Molder M.D on 03/24/2017 at 4:44 PM  Between 7am to 7pm - Pager - [phone]  After 7pm go to [email or web address] - password [facility]  [facility] -  Office  [phone]

## 2017-03-24 NOTE — Progress Notes (Signed)
  Echocardiogram 2D Echocardiogram has been performed.  Bobbye Charleston 03/24/2017, 3:30 PM

## 2017-03-24 NOTE — H&P (Signed)
History and Physical    Jill [name] DOB: [birth year] DOA: 03/23/2017  Referring MD/NP/PA: Timoteo Ace PCP: Odette Fraction, MD  Patient coming from: Urgent care  Chief Complaint: Elevated blood pressure and abnormal EKG  HPI: Jill [name] is a 61 y.o. female pmh of prediabetes and arrhythmia; who presents with complaints of elevated blood pressure and abnormal EKG from urgent care. On 4/22 patient went to urgent care and was diagnosed with bronchitis which she was started on prednisone, cough medicine, and a albuterol breathing treatment. She reportedly took some cough medicine and developed a reaction to the medicine and had acute onset of stabbing pain in the back of her head and feelings that her throat closing up. She immediately called EMS for symptoms self resolved and she refused transport. She thereafter noted yesterday taking her prednisone and noted onset of nausea with vomiting, headache, and worsening shortness of breath for which she went back to the urgent care for further evaluation. Upon evaluation patient was noted to have systolic blood pressures in the 200s and EKG showed abnormal findings for which patient was suggested to come to emergency department for further evaluation. The only other time that the patient missed to having issues with her blood pressure was when she was diagnosed with preeclampsia during the birth of one of her children. Prior to symptoms patient had reported having a nonproductive cough as well as complaints of tick bite in the last 2 weeks. Her husband had been diagnosed with a sinus infection in the last week or more as well. Denies having any significant rash, fever chest pain, or focal weakness.   ED Course: Upon admission into the emergency department patient was seen to be afebrile, heart rate is 47 and 91, respirations 14-27, blood pressure elevated up to 210/93, and O2 saturations maintain room air. Labs revealed WBC 20.5, BUN,  creatinine 1.19, glucose 313. Patient was given Benadryl and 200 mg of Solu-Cortef prior to CT angiogram for reported history of contrast allergy. CT angiogram was negative for any signs of dissection or acute lung abnormality. Patient was given 10 mg of hydralazine IV and 10 mg labetalol IV with recurrence of elevated blood pressures.  Review of Systems: As per HPI otherwise 10 point review of systems negative.   Past Medical History:  Diagnosis Date  . Allergy   . Colon polyp     History reviewed. No pertinent surgical history.   reports that she has never smoked. She has never used smokeless tobacco. She reports that she drinks alcohol. She reports that she does not use drugs.  Allergies  Allergen Reactions  . Codeine Nausea Only    Dizziness  . Valium [Diazepam] Nausea Only    Dizziness  . Penicillins Rash    Family History  Problem Relation Age of Onset  . Dementia Mother   . Diabetes Father   . Heart disease Father   . Hypertension Father   . Cancer Sister     endometrial  . Cancer Maternal Aunt     ovarian    Prior to Admission medications   Medication Sig Start Date End Date Taking? Authorizing Provider  cholecalciferol (VITAMIN D) 1000 UNITS tablet Take 1,000 Units by mouth daily.   Yes Historical Provider, MD  Multiple Vitamin (MULTIVITAMIN WITH MINERALS) TABS tablet Take 1 tablet by mouth daily.   Yes Historical Provider, MD  valACYclovir (VALTREX) 1000 MG tablet TAKE 2 TABS BY MOUTH 2 TIMES A DAY FOR 1 DAY Patient  not taking: Reported on 03/24/2017 02/14/15   Susy Frizzle, MD    Physical Exam: Constitutional: Female who appears acutely sick and uncomfortable. Vitals:   03/23/17 2220 03/23/17 2230 03/23/17 2300 03/23/17 2330  BP: (!) 196/79 (!) 185/93 (!) 178/85 (!) 161/79  Pulse:  91 79 68  Resp:  19 19 20   Temp:      TempSrc:      SpO2:  94% 97% 96%  Weight:      Height:       Eyes: PERRL, lids and conjunctivae normal ENMT: Mucous membranes are  moist. Posterior pharynx clear of any exudate or lesions. Normal dentition.  Neck: normal, supple, no masses, no thyromegaly Respiratory: clear to auscultation bilaterally, no wheezing, no crackles. Normal respiratory effort. No accessory muscle use.  Cardiovascular: Regular rate and rhythm, no murmurs / rubs / gallops. No extremity edema. 2+ pedal pulses. No carotid bruits.  Abdomen: no tenderness, no masses palpated. No hepatosplenomegaly. Bowel sounds positive.  Musculoskeletal: no clubbing / cyanosis. No joint deformity upper and lower extremities. Good ROM, no contractures. Normal muscle tone.  Skin: no rashes, lesions, ulcers. No induration Neurologic: CN 2-12 grossly intact. Sensation intact, DTR normal. Strength 5/5 in all 4.  Psychiatric: Normal judgment and insight. Alert and oriented x 3. Normal mood.     Labs on Admission: I have personally reviewed following labs and imaging studies  CBC:  Recent Labs Lab 03/23/17 2129 03/23/17 2155  WBC 20.5*  --   NEUTROABS 18.3*  --   HGB 13.7 13.9  HCT 41.1 41.0  MCV 80.6  --   PLT 375  --    Basic Metabolic Panel:  Recent Labs Lab 03/23/17 2129 03/23/17 2155  NA 139 139  K 4.5 4.5  CL 106 106  CO2 22  --   GLUCOSE 313* 315*  BUN 19 22*  CREATININE 1.19* 1.00  CALCIUM 9.3  --    GFR: Estimated Creatinine Clearance: 54.2 mL/min (by C-G formula based on SCr of 1 mg/dL). Liver Function Tests:  Recent Labs Lab 03/23/17 2129  AST 33  ALT 26  ALKPHOS 76  BILITOT 0.8  PROT 7.0  ALBUMIN 3.8    Recent Labs Lab 03/23/17 2129  LIPASE 22   No results for input(s): AMMONIA in the last 168 hours. Coagulation Profile: No results for input(s): INR, PROTIME in the last 168 hours. Cardiac Enzymes: No results for input(s): CKTOTAL, CKMB, CKMBINDEX, TROPONINI in the last 168 hours. BNP (last 3 results) No results for input(s): PROBNP in the last 8760 hours. HbA1C: No results for input(s): HGBA1C in the last 72  hours. CBG: No results for input(s): GLUCAP in the last 168 hours. Lipid Profile: No results for input(s): CHOL, HDL, LDLCALC, TRIG, CHOLHDL, LDLDIRECT in the last 72 hours. Thyroid Function Tests: No results for input(s): TSH, T4TOTAL, FREET4, T3FREE, THYROIDAB in the last 72 hours. Anemia Panel: No results for input(s): VITAMINB12, FOLATE, FERRITIN, TIBC, IRON, RETICCTPCT in the last 72 hours. Urine analysis: No results found for: COLORURINE, APPEARANCEUR, LABSPEC, PHURINE, GLUCOSEU, HGBUR, BILIRUBINUR, KETONESUR, PROTEINUR, UROBILINOGEN, NITRITE, LEUKOCYTESUR Sepsis Labs: No results found for this or any previous visit (from the past 240 hour(s)).   Radiological Exams on Admission: Dg Chest 2 View  Result Date: 03/23/2017 CLINICAL DATA:  Tachycardia. Nausea and vomiting. Headache and cough. EXAM: CHEST  2 VIEW COMPARISON:  None. FINDINGS: Artifact overlies chest. Heart size is normal. Mediastinal shadows are normal. The lungs are clear. No effusions. Minimal spinal  curvature. No acute bone finding. IMPRESSION: No active cardiopulmonary disease. Electronically Signed   By: Nelson Chimes M.D.   On: 03/23/2017 20:37   Ct Head Wo Contrast  Result Date: 03/23/2017 CLINICAL DATA:  Intermittent severe headache.  Hypertension. EXAM: CT HEAD WITHOUT CONTRAST TECHNIQUE: Contiguous axial images were obtained from the base of the skull through the vertex without intravenous contrast. COMPARISON:  None. FINDINGS: BRAIN: No intraparenchymal hemorrhage, mass effect nor midline shift. The ventricles and sulci are normal. No acute large vascular territory infarcts. No abnormal extra-axial fluid collections. Basal cisterns are patent. VASCULAR: Unremarkable. SKULL/SOFT TISSUES: No skull fracture. Osteopenia. No significant soft tissue swelling. ORBITS/SINUSES: The included ocular globes and orbital contents are normal.Moderate paranasal sinus mucosal thickening. Mastoid air cells are well aerated. OTHER: None.  IMPRESSION: Normal CT HEAD. Moderate paranasal sinusitis. Electronically Signed   By: Elon Alas M.D.   On: 03/23/2017 21:11    EKG: Independently reviewed. Sinus rhythm with prolonged QTC of 533  Assessment/Plan Hypertensive urgency: Acute. Patient found to have elevated systolic blood pressure into the 200s.symptoms appear to have occurred following use of decongestant and steroids.  - Admit to a telemetry bed - Hydralazine IV prn sBP >180 or dBP>110   Sinusitis: As seen on CT scan patient presents with nonproductive cough. -  Doxycycline IV 2/2 nausea and vomiting antibiotic chosen also due to reports of recent tick bite  Nausea and vomiting - Check urinalysis - Continue to monitor  Leukocytosis: WBC elevated at 20.5. Question if secondary to above or recent steroids. - Recheck CBC in a.m.  Headache: Possibly secondary to above.  Prolonged QT: Shows initial QTc was noted to be 533 - Recheck EKG in a.m.  - Currently decreasing use of medications that could further prolonged QTC  Dyspnea - Albuterol nebs prn SOB/Wheezing  Prediabetes with Hyperglycemia: Patient's last hemoglobin A1c was noted to be 6.1 performed on 03/10/2017. Elevated blood sugars greater than 300 Likely secondary to steroids given for recent treatment for bronchitis. - CBGs every before meals and at bedtime with cystoscopy sliding scale of insulin.  DVT prophylaxis:  Lovenox Code Status: Full  Family Communication: Discussed plan of care with the patient and family present at bedside  Disposition Plan: Possible discharge home in a.m. Consults called: none Admission status: observation  Norval Morton MD [facility] Pager [phone]  If 7PM-7AM, please contact night-coverage [email or web address] Password [facility]  03/24/2017, 12:44 AM

## 2017-03-25 ENCOUNTER — Telehealth: Payer: Self-pay | Admitting: Family Medicine

## 2017-03-25 LAB — GLUCOSE, CAPILLARY
GLUCOSE-CAPILLARY: 183 mg/dL — AB (ref 65–99)
Glucose-Capillary: 168 mg/dL — ABNORMAL HIGH (ref 65–99)

## 2017-03-25 LAB — B. BURGDORFI ANTIBODIES: B burgdorferi Ab IgG+IgM: <0.91 {ISR} (ref 0.00–0.90)

## 2017-03-25 LAB — ROCKY MTN SPOTTED FVR ABS PNL(IGG+IGM)
RMSF IgG: NEGATIVE
RMSF IgM: 0.26 index (ref 0.00–0.89)

## 2017-03-25 NOTE — Telephone Encounter (Signed)
Patient has hospital f/u with dr pickard next Thursday, however has questions about some of the meds that were prescribed at the hospital she would like answered before then, please call her at [phone]

## 2017-03-25 NOTE — Telephone Encounter (Signed)
Called and spoke to pt and she had read that Metoprolol could make your blood sugar go up and while she was in the hospital her sugars were running high and she was wondering what to do about it if anything prior to her appointment. I told her to come by and get a meter and check her BS and bring in values to her appt. Left meter up front and she will come get it. She is going to check one day fasting and one day 2 hrs after a meal as it only has 10 strips per box.

## 2017-03-26 ENCOUNTER — Encounter: Payer: Self-pay | Admitting: Family Medicine

## 2017-03-26 ENCOUNTER — Ambulatory Visit (INDEPENDENT_AMBULATORY_CARE_PROVIDER_SITE_OTHER): Payer: BC Managed Care – PPO | Admitting: Family Medicine

## 2017-03-26 VITALS — BP 210/78 | HR 62 | Temp 98.0°F | Resp 16 | Ht 63.0 in | Wt 137.0 lb

## 2017-03-26 DIAGNOSIS — I471 Supraventricular tachycardia: Secondary | ICD-10-CM

## 2017-03-26 DIAGNOSIS — Z09 Encounter for follow-up examination after completed treatment for conditions other than malignant neoplasm: Secondary | ICD-10-CM

## 2017-03-26 DIAGNOSIS — B349 Viral infection, unspecified: Secondary | ICD-10-CM | POA: Diagnosis not present

## 2017-03-26 LAB — CBC WITH DIFFERENTIAL/PLATELET
Basophils Absolute: 0 cells/uL (ref 0–200)
Basophils Relative: 0 %
EOS PCT: 0 %
Eosinophils Absolute: 0 cells/uL — ABNORMAL LOW (ref 15–500)
HCT: 36.3 % (ref 35.0–45.0)
HEMOGLOBIN: 11.9 g/dL — AB (ref 12.0–15.0)
LYMPHS ABS: 1240 {cells}/uL (ref 850–3900)
Lymphocytes Relative: 10 %
MCH: 26.7 pg — ABNORMAL LOW (ref 27.0–33.0)
MCHC: 32.8 g/dL (ref 32.0–36.0)
MCV: 81.4 fL (ref 80.0–100.0)
MONOS PCT: 7 %
MPV: 10.2 fL (ref 7.5–12.5)
Monocytes Absolute: 868 cells/uL (ref 200–950)
NEUTROS ABS: 10292 {cells}/uL — AB (ref 1500–7800)
Neutrophils Relative %: 83 %
PLATELETS: 369 10*3/uL (ref 140–400)
RBC: 4.46 MIL/uL (ref 3.80–5.10)
RDW: 14.3 % (ref 11.0–15.0)
WBC: 12.4 10*3/uL — ABNORMAL HIGH (ref 3.8–10.8)

## 2017-03-26 LAB — COMPLETE METABOLIC PANEL WITH GFR
AG Ratio: 1.9 Ratio (ref 1.0–2.5)
ALBUMIN: 3.8 g/dL (ref 3.6–5.1)
ALK PHOS: 62 U/L (ref 33–130)
ALT: 37 U/L — ABNORMAL HIGH (ref 6–29)
AST: 24 U/L (ref 10–35)
BUN / CREAT RATIO: 28.8 ratio — AB (ref 6–22)
BUN: 21 mg/dL (ref 7–25)
CO2: 24 mmol/L (ref 20–31)
Calcium: 9.5 mg/dL (ref 8.6–10.4)
Chloride: 105 mmol/L (ref 98–110)
Creat: 0.73 mg/dL (ref 0.50–1.05)
GFR, Est African American: >89 mL/min (ref >=60)
Globulin: 2 g/dL (ref 1.9–3.7)
Glucose, Bld: 211 mg/dL — ABNORMAL HIGH (ref 70–99)
POTASSIUM: 4 mmol/L (ref 3.5–5.3)
SODIUM: 143 mmol/L (ref 135–146)
Total Bilirubin: 0.5 mg/dL (ref 0.2–1.2)
Total Protein: 5.8 g/dL — ABNORMAL LOW (ref 6.1–8.1)

## 2017-03-26 NOTE — Progress Notes (Signed)
Subjective:    Patient ID: Jill [name], female    DOB: 16-Jan-1956, 61 y.o.   MRN: [id number]  HPI Please see my last office visit. Holter monitor had one episode of heart rate of 144 bpm. At that time, although difficult to determine because of motion artifact, there appears to be differing P-wave morphology consistent with multifocal atrial tachycardia. She is here today to follow-up to discuss this. However she was recently hospitalized. Since I last saw the patient, she developed an upper respiratory infection. Went to urgent care where she was diagnosed with bronchitis and wheezing and was given Combivent and prednisone. Patient did not take prednisone but felt better after taking Combivent as it seemed to help the wheezing and coughing. Unfortunately she took bends and 8 and Zyrtec and immediately had a reaction to this medication with stabbing pain in her neck and in her occiput and swelling in her lips. She called 911. By the time they arrived symptoms had resolved. She then decided to take prednisone. She felt better after 24 hours on the prednisone however the following day she developed severe headache and went back to urgent care where she was diagnosed with an extremely elevated blood pressure and was referred to the emergency room. CT scan of the head of the emergency room revealed moderate sinusitis. Telemetry revealed atrial tachycardia. Cardiology was consulted and recommended starting metoprolol and follow up as an outpatient in 2 weeks. This would be consistent with a Holter monitor findings. She was started on doxycycline for sinusitis and bronchitis and to cover for possible Lyme disease or [facility] spotted fever. Line and [facility] spotted fever titers have returned negative. She continues to have episodic headaches and neck stiffness as well as sinus pain and pressure along with a cough.  She is extremely anxious today and her blood pressure is elevated. In the hospital her  white blood cell count was greater than 20 and this was attributed to prednisone which the patient is no longer taking. Her blood sugars were greater than 300. However after stopping prednisone, her fasting blood sugar this morning is 150 Past Medical History:  Diagnosis Date  . Arthritis    "hands" (03/24/2017)  . Colon polyp   . High cholesterol   . PAT (paroxysmal atrial tachycardia) ([place])    [name] 03/24/2017  . Peptic ulcer    "when I was a child"  . PONV (postoperative nausea and vomiting)    "bad when they did my wisdom teeth"  . Pre-diabetes   . Seasonal allergies    Past Surgical History:  Procedure Laterality Date  . CERVICAL CERCLAGE  X 2  . COLONOSCOPY W/ BIOPSIES AND POLYPECTOMY    . TONSILLECTOMY    . WISDOM TOOTH EXTRACTION     Current Outpatient Prescriptions on File Prior to Visit  Medication Sig Dispense Refill  . cholecalciferol (VITAMIN D) 1000 UNITS tablet Take 1,000 Units by mouth daily.    Marland Kitchen doxycycline (VIBRA-TABS) 100 MG tablet Take 1 tablet (100 mg total) by mouth 2 (two) times daily. 14 tablet 0  . metoprolol succinate (TOPROL-XL) 25 MG 24 hr tablet Take 1 tablet (25 mg total) by mouth daily. 30 tablet 0  . Multiple Vitamin (MULTIVITAMIN WITH MINERALS) TABS tablet Take 1 tablet by mouth daily.     No current facility-administered medications on file prior to visit.    Allergies  Allergen Reactions  . Codeine Nausea Only    Dizziness  . Tessalon [Benzonatate] Other (  See Comments)    Per pt this mixed with Tessalon made her throat close up  . Valium [Diazepam] Nausea Only    Dizziness  . Zyrtec [Cetirizine] Other (See Comments)    Per pt this mixed with Tessalon made her throat close up  . Penicillins Rash   Social History   Social History  . Marital status: Married    Spouse name: N/A  . Number of children: N/A  . Years of education: N/A   Occupational History  . Not on file.   Social History Main Topics  . Smoking status: Never Smoker    . Smokeless tobacco: Never Used  . Alcohol use Yes     Comment: 03/24/2017 "might average 1 drink/month"  . Drug use: No  . Sexual activity: Yes   Other Topics Concern  . Not on file   Social History Narrative  . No narrative on file       Review of Systems  All other systems reviewed and are negative.      Objective:   Physical Exam  Constitutional: She is oriented to person, place, and time.  Neck: No JVD present. No tracheal deviation present. No thyromegaly present.  Cardiovascular: Normal rate, regular rhythm and normal heart sounds.   No murmur heard. Pulmonary/Chest: Effort normal and breath sounds normal. No respiratory distress. She has no wheezes. She has no rales.  Abdominal: Soft. Bowel sounds are normal. She exhibits no distension and no mass. There is no tenderness. There is no rebound and no guarding.  Musculoskeletal: She exhibits no edema.  Lymphadenopathy:    She has no cervical adenopathy.  Neurological: She is alert and oriented to person, place, and time. She has normal reflexes. She displays normal reflexes. No cranial nerve deficit. She exhibits normal muscle tone. Coordination normal.  Psychiatric: Her mood appears anxious.  Vitals reviewed.         Assessment & Plan:  Hospital discharge follow-up - Plan: CBC with Differential/Platelet, COMPLETE METABOLIC PANEL WITH GFR  Multifocal atrial tachycardia (HCC)  Viral syndrome  I believe the patient has several problems. I believe her palpitations are likely secondary to atrial tachycardia. I've asked her to try the metoprolol started in the emergency room and follow-up with cardiology in 2 weeks. If palpitations persist, EP consultation may be necessary. Also believe that anxiety is playing a role in her palpitations and tachycardia as well and she tends to agree with this. Second issue is her leukocytosis and illness. I believe the patient does not have a tickborne illness but rather has a virus.  A virus would explain her bronchitis and sinusitis. It would also explain possible viral meningitis which is what I believe is giving her the headaches and the neck stiffness. He could also explain some of the leukocytosis in addition to the prednisone. Clinically she is getting better. Therefore I would like to recheck a white blood cell count today. I will feel much better if her white blood cell count is trending downward. Third issue is the patient's blood pressure. Blood pressure in December was 120/68 in my office. Blood pressure at her last visit was 878 systolic. Today is greater than 200 but she is extremely anxious. She is concerned that she is dying. I believe the majority of her elevated blood pressure is due to anxiety. I believe anxiety is likely also contributing to her multifocal atrial tachycardia or at least making it worse. I believe that the "reaction" that she had to Zyrtec and  there is an 8 may have also been a panic attack. I would like to recheck the patient's blood pressure later today after she's had a chance to calm down.

## 2017-03-27 ENCOUNTER — Inpatient Hospital Stay (HOSPITAL_COMMUNITY): Payer: BC Managed Care – PPO

## 2017-03-27 ENCOUNTER — Inpatient Hospital Stay (HOSPITAL_COMMUNITY)
Admission: EM | Admit: 2017-03-27 | Discharge: 2017-03-30 | DRG: 439 | Disposition: A | Discharge status: Home / Self Care | Payer: BC Managed Care – PPO | Attending: Internal Medicine | Admitting: Internal Medicine

## 2017-03-27 ENCOUNTER — Telehealth: Payer: Self-pay

## 2017-03-27 ENCOUNTER — Encounter (HOSPITAL_COMMUNITY): Payer: Self-pay | Admitting: *Deleted

## 2017-03-27 ENCOUNTER — Emergency Department (HOSPITAL_COMMUNITY): Payer: BC Managed Care – PPO

## 2017-03-27 ENCOUNTER — Ambulatory Visit: Payer: BC Managed Care – PPO

## 2017-03-27 VITALS — BP 140/78 | HR 66

## 2017-03-27 DIAGNOSIS — I471 Supraventricular tachycardia: Secondary | ICD-10-CM | POA: Diagnosis present

## 2017-03-27 DIAGNOSIS — Z91013 Allergy to seafood: Secondary | ICD-10-CM

## 2017-03-27 DIAGNOSIS — I4719 Other supraventricular tachycardia: Secondary | ICD-10-CM | POA: Diagnosis present

## 2017-03-27 DIAGNOSIS — R112 Nausea with vomiting, unspecified: Secondary | ICD-10-CM

## 2017-03-27 DIAGNOSIS — K851 Biliary acute pancreatitis without necrosis or infection: Secondary | ICD-10-CM | POA: Diagnosis not present

## 2017-03-27 DIAGNOSIS — I160 Hypertensive urgency: Secondary | ICD-10-CM | POA: Diagnosis present

## 2017-03-27 DIAGNOSIS — E1165 Type 2 diabetes mellitus with hyperglycemia: Secondary | ICD-10-CM | POA: Diagnosis present

## 2017-03-27 DIAGNOSIS — Z885 Allergy status to narcotic agent status: Secondary | ICD-10-CM | POA: Diagnosis not present

## 2017-03-27 DIAGNOSIS — R109 Unspecified abdominal pain: Secondary | ICD-10-CM | POA: Diagnosis not present

## 2017-03-27 DIAGNOSIS — Z79899 Other long term (current) drug therapy: Secondary | ICD-10-CM

## 2017-03-27 DIAGNOSIS — R739 Hyperglycemia, unspecified: Secondary | ICD-10-CM | POA: Diagnosis not present

## 2017-03-27 DIAGNOSIS — Z888 Allergy status to other drugs, medicaments and biological substances status: Secondary | ICD-10-CM

## 2017-03-27 DIAGNOSIS — K8500 Idiopathic acute pancreatitis without necrosis or infection: Secondary | ICD-10-CM | POA: Diagnosis not present

## 2017-03-27 DIAGNOSIS — I4581 Long QT syndrome: Secondary | ICD-10-CM | POA: Diagnosis present

## 2017-03-27 DIAGNOSIS — K859 Acute pancreatitis without necrosis or infection, unspecified: Principal | ICD-10-CM | POA: Diagnosis present

## 2017-03-27 DIAGNOSIS — Z91041 Radiographic dye allergy status: Secondary | ICD-10-CM | POA: Diagnosis not present

## 2017-03-27 DIAGNOSIS — R030 Elevated blood-pressure reading, without diagnosis of hypertension: Secondary | ICD-10-CM

## 2017-03-27 DIAGNOSIS — Z0130 Encounter for examination of blood pressure without abnormal findings: Secondary | ICD-10-CM

## 2017-03-27 DIAGNOSIS — I10 Essential (primary) hypertension: Secondary | ICD-10-CM | POA: Diagnosis present

## 2017-03-27 DIAGNOSIS — E278 Other specified disorders of adrenal gland: Secondary | ICD-10-CM | POA: Diagnosis present

## 2017-03-27 DIAGNOSIS — R51 Headache: Secondary | ICD-10-CM | POA: Diagnosis present

## 2017-03-27 LAB — CBC
HCT: 40.5 % (ref 36.0–46.0)
Hemoglobin: 13.1 g/dL (ref 12.0–15.0)
MCH: 26.1 pg (ref 26.0–34.0)
MCHC: 32.3 g/dL (ref 30.0–36.0)
MCV: 80.7 fL (ref 78.0–100.0)
Platelets: 390 10*3/uL (ref 150–400)
RBC: 5.02 MIL/uL (ref 3.87–5.11)
RDW: 13.2 % (ref 11.5–15.5)
WBC: 16.4 10*3/uL — ABNORMAL HIGH (ref 4.0–10.5)

## 2017-03-27 LAB — COMPREHENSIVE METABOLIC PANEL
ALT: 60 U/L — ABNORMAL HIGH (ref 14–54)
AST: 36 U/L (ref 15–41)
Albumin: 3.6 g/dL (ref 3.5–5.0)
Alkaline Phosphatase: 71 U/L (ref 38–126)
Anion gap: 12 (ref 5–15)
BUN: 19 mg/dL (ref 6–20)
CO2: 24 mmol/L (ref 22–32)
Calcium: 8.8 mg/dL — ABNORMAL LOW (ref 8.9–10.3)
Chloride: 101 mmol/L (ref 101–111)
Creatinine, Ser: 0.75 mg/dL (ref 0.44–1.00)
GFR calc Af Amer: >60 mL/min (ref >60)
GFR calc non Af Amer: >60 mL/min (ref >60)
Glucose, Bld: 233 mg/dL — ABNORMAL HIGH (ref 65–99)
Potassium: 3.4 mmol/L — ABNORMAL LOW (ref 3.5–5.1)
Sodium: 137 mmol/L (ref 135–145)
Total Bilirubin: 0.5 mg/dL (ref 0.3–1.2)
Total Protein: 5.9 g/dL — ABNORMAL LOW (ref 6.5–8.1)

## 2017-03-27 LAB — URINALYSIS, ROUTINE W REFLEX MICROSCOPIC
Bacteria, UA: NONE SEEN
Bilirubin Urine: NEGATIVE
Glucose, UA: >=500 mg/dL — AB
Ketones, ur: NEGATIVE mg/dL
Nitrite: NEGATIVE
Protein, ur: NEGATIVE mg/dL
Specific Gravity, Urine: 1.021 (ref 1.005–1.030)
pH: 5 (ref 5.0–8.0)

## 2017-03-27 LAB — GLUCOSE, CAPILLARY: GLUCOSE-CAPILLARY: 154 mg/dL — AB (ref 65–99)

## 2017-03-27 LAB — I-STAT TROPONIN, ED
Troponin i, poc: 0 ng/mL (ref 0.00–0.08)
Troponin i, poc: 0.07 ng/mL (ref 0.00–0.08)

## 2017-03-27 LAB — LIPASE, BLOOD: Lipase: 113 U/L — ABNORMAL HIGH (ref 11–51)

## 2017-03-27 LAB — I-STAT CG4 LACTIC ACID, ED
Lactic Acid, Venous: 1.79 mmol/L (ref 0.5–1.9)
Lactic Acid, Venous: 1.9 mmol/L (ref 0.5–1.9)

## 2017-03-27 MED ORDER — LORAZEPAM 2 MG/ML IJ SOLN
1.0000 mg | Freq: Once | INTRAMUSCULAR | Status: AC
  Administered 2017-03-27: 1 mg via INTRAVENOUS
  Filled 2017-03-27: qty 1

## 2017-03-27 MED ORDER — HYDROMORPHONE HCL 1 MG/ML IJ SOLN
1.0000 mg | INTRAMUSCULAR | Status: DC | PRN
  Administered 2017-03-28 (×2): 1 mg via INTRAVENOUS
  Filled 2017-03-27 (×2): qty 1

## 2017-03-27 MED ORDER — SODIUM CHLORIDE 0.9 % IV BOLUS (SEPSIS)
1000.0000 mL | Freq: Once | INTRAVENOUS | Status: AC
  Administered 2017-03-27: 1000 mL via INTRAVENOUS

## 2017-03-27 MED ORDER — IOPAMIDOL (ISOVUE-300) INJECTION 61%
INTRAVENOUS | Status: AC
  Filled 2017-03-27: qty 100

## 2017-03-27 MED ORDER — LABETALOL HCL 5 MG/ML IV SOLN
10.0000 mg | Freq: Once | INTRAVENOUS | Status: AC
  Administered 2017-03-27: 10 mg via INTRAVENOUS
  Filled 2017-03-27: qty 4

## 2017-03-27 MED ORDER — ONDANSETRON 4 MG PO TBDP
4.0000 mg | ORAL_TABLET | Freq: Once | ORAL | Status: AC | PRN
  Administered 2017-03-27: 4 mg via ORAL

## 2017-03-27 MED ORDER — INSULIN ASPART 100 UNIT/ML ~~LOC~~ SOLN
0.0000 [IU] | Freq: Four times a day (QID) | SUBCUTANEOUS | Status: DC
  Administered 2017-03-28: 2 [IU] via SUBCUTANEOUS
  Administered 2017-03-28: 1 [IU] via SUBCUTANEOUS
  Administered 2017-03-28: 2 [IU] via SUBCUTANEOUS
  Administered 2017-03-28 – 2017-03-29 (×4): 1 [IU] via SUBCUTANEOUS
  Administered 2017-03-30: 3 [IU] via SUBCUTANEOUS

## 2017-03-27 MED ORDER — SODIUM CHLORIDE 0.9 % IV SOLN: INTRAVENOUS | Status: DC

## 2017-03-27 MED ORDER — PROCHLORPERAZINE EDISYLATE 5 MG/ML IJ SOLN
10.0000 mg | Freq: Once | INTRAMUSCULAR | Status: AC
  Administered 2017-03-27: 10 mg via INTRAVENOUS
  Filled 2017-03-27: qty 2

## 2017-03-27 MED ORDER — LABETALOL HCL 5 MG/ML IV SOLN
10.0000 mg | INTRAVENOUS | Status: DC | PRN
  Administered 2017-03-29: 10 mg via INTRAVENOUS
  Filled 2017-03-27 (×2): qty 4

## 2017-03-27 MED ORDER — ENOXAPARIN SODIUM 40 MG/0.4ML ~~LOC~~ SOLN
40.0000 mg | SUBCUTANEOUS | Status: DC
  Administered 2017-03-27 – 2017-03-29 (×3): 40 mg via SUBCUTANEOUS
  Filled 2017-03-27 (×3): qty 0.4

## 2017-03-27 MED ORDER — HYDRALAZINE HCL 20 MG/ML IJ SOLN
10.0000 mg | Freq: Once | INTRAMUSCULAR | Status: AC
Start: 2017-03-27 — End: 2017-03-27
  Administered 2017-03-27: 10 mg via INTRAVENOUS
  Filled 2017-03-27: qty 1

## 2017-03-27 MED ORDER — DIPHENHYDRAMINE HCL 50 MG/ML IJ SOLN
25.0000 mg | Freq: Once | INTRAMUSCULAR | Status: AC
  Administered 2017-03-27: 25 mg via INTRAVENOUS
  Filled 2017-03-27: qty 1

## 2017-03-27 MED ORDER — POTASSIUM CHLORIDE IN NACL 20-0.9 MEQ/L-% IV SOLN
INTRAVENOUS | Status: AC
  Administered 2017-03-27 – 2017-03-29 (×5): via INTRAVENOUS
  Filled 2017-03-27 (×5): qty 1000

## 2017-03-27 MED ORDER — METOPROLOL SUCCINATE ER 25 MG PO TB24
25.0000 mg | ORAL_TABLET | Freq: Every day | ORAL | Status: DC
  Administered 2017-03-28 – 2017-03-29 (×2): 25 mg via ORAL
  Filled 2017-03-27 (×3): qty 1

## 2017-03-27 MED ORDER — ONDANSETRON HCL 4 MG/2ML IJ SOLN
4.0000 mg | Freq: Once | INTRAMUSCULAR | Status: AC
  Administered 2017-03-27: 4 mg via INTRAVENOUS
  Filled 2017-03-27: qty 2

## 2017-03-27 MED ORDER — ONDANSETRON 4 MG PO TBDP
ORAL_TABLET | ORAL | Status: AC
  Filled 2017-03-27: qty 1

## 2017-03-27 NOTE — ED Notes (Signed)
Patient is stable and ready to be transport to the floor at this time.  Report was called to 6N RN.  Belongings taken with the patient to the floor.   

## 2017-03-27 NOTE — ED Provider Notes (Signed)
I saw and evaluated the patient, reviewed the resident's note and I agree with the findings and plan.   EKG Interpretation None      61yF with headache and n/v. Very hypertensive. Recent admit and appears to have been persistently hypertensive through out her hospitalization. She is on metoprolol for dysrhythmia. She was not started on additional meds for BP. Now also with abdominal pain and nausea. Interestingly, has pancreatitis. Denies etoh. Admit.    Virgel Manifold, MD 04/01/17 [phone]

## 2017-03-27 NOTE — ED Provider Notes (Signed)
[facility] DEPT Provider Note   CSN: [id number] Arrival date & time: 03/27/17  1301     History   Chief Complaint Chief Complaint  Patient presents with  . Headache  . Emesis    HPI Jill [name] is a 61 y.o. female.  The history is provided by the patient.  Headache   This is a recurrent problem. The current episode started 6 to 12 hours ago. The problem occurs constantly. The problem has been gradually worsening. The headache is associated with nothing. The pain is located in the frontal region. The pain is at a severity of 10/10. The pain is severe. The pain does not radiate. Associated symptoms include malaise/fatigue, nausea and vomiting. Pertinent negatives include no anorexia, no fever, no chest pressure, no near-syncope, no orthopnea, no palpitations, no syncope and no shortness of breath. She has tried nothing for the symptoms. The treatment provided no relief.    Past Medical History:  Diagnosis Date  . Arthritis    "hands" (03/24/2017)  . Colon polyp   . High cholesterol   . PAT (paroxysmal atrial tachycardia) ([place])    [name] 03/24/2017  . Peptic ulcer    "when I was a child"  . PONV (postoperative nausea and vomiting)    "bad when they did my wisdom teeth"  . Pre-diabetes   . Seasonal allergies     Patient Active Problem List   Diagnosis Date Noted  . Acute pancreatitis without infection or necrosis 03/27/2017  . Hypertensive urgency 03/24/2017  . Sinusitis 03/24/2017  . Leukocytosis 03/24/2017  . Headache 03/24/2017  . Hyperglycemia 03/24/2017  . Atrial tachycardia, paroxysmal ([place]) 03/24/2017  . HLD (hyperlipidemia) 10/29/2016    Past Surgical History:  Procedure Laterality Date  . CERVICAL CERCLAGE  X 2  . COLONOSCOPY W/ BIOPSIES AND POLYPECTOMY    . TONSILLECTOMY    . WISDOM TOOTH EXTRACTION      OB History    No data available       Home Medications    Prior to Admission medications   Medication Sig Start Date End Date Taking?  Authorizing Provider  cholecalciferol (VITAMIN D) 1000 UNITS tablet Take 1,000 Units by mouth daily.   Yes Historical Provider, MD  metoprolol succinate (TOPROL-XL) 25 MG 24 hr tablet Take 1 tablet (25 mg total) by mouth daily. 03/25/17  Yes Nishant Dhungel, MD  Multiple Vitamin (MULTIVITAMIN WITH MINERALS) TABS tablet Take 1 tablet by mouth daily.   Yes Historical Provider, MD    Family History Family History  Problem Relation Age of Onset  . Dementia Mother   . Diabetes Father   . Heart disease Father   . Hypertension Father   . Cancer Sister     endometrial  . Cancer Maternal Aunt     ovarian    Social History Social History  Substance Use Topics  . Smoking status: Never Smoker  . Smokeless tobacco: Never Used  . Alcohol use Yes     Comment: 03/24/2017 "might average 1 drink/month"     Allergies   Codeine; Contrast media [iodinated diagnostic agents]; Tessalon [benzonatate]; Valium [diazepam]; Zyrtec [cetirizine]; and Penicillins   Review of Systems Review of Systems  Constitutional: Positive for malaise/fatigue. Negative for chills and fever.  HENT: Negative for ear pain and sore throat.   Eyes: Positive for photophobia. Negative for pain and visual disturbance.  Respiratory: Negative for cough and shortness of breath.   Cardiovascular: Negative for chest pain, palpitations, orthopnea, syncope and near-syncope.  Gastrointestinal: Positive for abdominal pain, nausea and vomiting. Negative for anorexia.  Genitourinary: Negative for dysuria and hematuria.  Musculoskeletal: Negative for arthralgias and back pain.  Skin: Negative for color change and rash.  Neurological: Positive for headaches. Negative for seizures and syncope.  All other systems reviewed and are negative.    Physical Exam Updated Vital Signs BP (!) 147/62 (BP Location: Right Arm)   Pulse 74   Temp (!) 100.8 F (38.2 C) (Oral) Comment: nurse notify  Resp 17   Ht 5\' 3"  (1.6 m)   Wt 61.9 kg    SpO2 98%   BMI 24.18 kg/m   Physical Exam  Constitutional: She appears well-developed and well-nourished. She appears distressed.  HENT:  Head: Normocephalic and atraumatic.  Eyes: Conjunctivae and EOM are normal.  Neck: Neck supple.  Cardiovascular: Normal rate and regular rhythm.   No murmur heard. Pulmonary/Chest: Effort normal and breath sounds normal. No respiratory distress.  Abdominal: Soft. There is tenderness in the epigastric area, periumbilical area and left upper quadrant. There is no rigidity, no guarding and no CVA tenderness.  Musculoskeletal: She exhibits no edema.  Neurological: She is alert. No cranial nerve deficit or sensory deficit. GCS eye subscore is 4. GCS verbal subscore is 5. GCS motor subscore is 6.  Skin: Skin is warm and dry.  Psychiatric: She has a normal mood and affect.  Nursing note and vitals reviewed.    ED Treatments / Results  Labs (all labs ordered are listed, but only abnormal results are displayed) Labs Reviewed  LIPASE, BLOOD - Abnormal; Notable for the following:       Result Value   Lipase 113 (*)    All other components within normal limits  COMPREHENSIVE METABOLIC PANEL - Abnormal; Notable for the following:    Potassium 3.4 (*)    Glucose, Bld 233 (*)    Calcium 8.8 (*)    Total Protein 5.9 (*)    ALT 60 (*)    All other components within normal limits  CBC - Abnormal; Notable for the following:    WBC 16.4 (*)    All other components within normal limits  URINALYSIS, ROUTINE W REFLEX MICROSCOPIC - Abnormal; Notable for the following:    Glucose, UA >=500 (*)    Hgb urine dipstick SMALL (*)    Leukocytes, UA MODERATE (*)    Squamous Epithelial / LPF 0-5 (*)    All other components within normal limits  I-STAT TROPOININ, ED  I-STAT CG4 LACTIC ACID, ED  I-STAT TROPOININ, ED  I-STAT CG4 LACTIC ACID, ED  Randolm Idol, ED    EKG  EKG Interpretation  Date/Time:  Friday March 27 2017 16:57:55 EDT Ventricular Rate:   61 PR Interval:    QRS Duration: 89 QT Interval:  545 QTC Calculation: 550 R Axis:   -43 Text Interpretation:  Sinus rhythm  left atrial enlargement Left axis deviation RSR' in V1 or V2, probably normal variant Borderline T wave abnormalities Confirmed by Wilson Singer  MD, STEPHEN [id number]) on 03/27/2017 6:32:26 PM       Radiology Ct Abdomen Pelvis Wo Contrast  Result Date: 03/27/2017 CLINICAL DATA:  Vomiting, fever, headache and hypertension. One day duration. EXAM: CT ABDOMEN AND PELVIS WITHOUT CONTRAST TECHNIQUE: Multidetector CT imaging of the abdomen and pelvis was performed following the standard protocol without IV contrast. COMPARISON:  None. FINDINGS: Lower chest: Normal except for minimal atelectasis/scar. Hepatobiliary: Normal Pancreas: Probable orally pancreatitis with mild edema and swelling in the region the  pancreatic head. Spleen: Normal Adrenals/Urinary Tract: Adrenal glands are normal. Kidneys are normal. No cyst, mass, stone or hydronephrosis. Stomach/Bowel: Cannot rule out antral gastritis with wall thickening. This is not a definite finding. No other intestinal abnormality. Appendix is normal. Vascular/Lymphatic: Aortic atherosclerosis. No aneurysm. IVC is normal. No retroperitoneal adenopathy. Reproductive: Normal Other: No free fluid or air. Musculoskeletal: Normal IMPRESSION: Probable early pancreatitis with mild pancreatic swelling and surrounding edema. Cannot rule out antral gastritis.  This is not a certain finding. Aortic atherosclerosis. Electronically Signed   By: Nelson Chimes M.D.   On: 03/27/2017 18:38   Ct Head Wo Contrast  Result Date: 03/27/2017 CLINICAL DATA:  Headache. Hypertension. Symptoms of 1 day duration. EXAM: CT HEAD WITHOUT CONTRAST TECHNIQUE: Contiguous axial images were obtained from the base of the skull through the vertex without intravenous contrast. COMPARISON:  03/23/2017 FINDINGS: Brain: No evidence of malformation, atrophy, old or acute small or large  vessel infarction, mass lesion, hemorrhage, hydrocephalus or extra-axial collection. No evidence of pituitary lesion. Vascular: There is atherosclerotic calcification of the major vessels at the base of the brain. Skull: Normal.  No fracture or focal bone lesion. Sinuses/Orbits: Opacification of the sphenoid sinus which could be a cause of headache. Mild mucosal thickening of the left maxillary sinus. Orbits negative. Other: None significant IMPRESSION: Normal appearance of the brain itself. Sphenoid sinusitis which could be a cause of headache. Electronically Signed   By: Nelson Chimes M.D.   On: 03/27/2017 18:39    Procedures Procedures (including critical care time)  Medications Ordered in ED Medications  iopamidol (ISOVUE-300) 61 % injection (not administered)  0.9 %  sodium chloride infusion (not administered)  ondansetron (ZOFRAN-ODT) disintegrating tablet 4 mg (4 mg Oral Given 03/27/17 1321)  labetalol (NORMODYNE,TRANDATE) injection 10 mg (10 mg Intravenous Given 03/27/17 1700)  sodium chloride 0.9 % bolus 1,000 mL (0 mLs Intravenous Stopped 03/27/17 1915)  prochlorperazine (COMPAZINE) injection 10 mg (10 mg Intravenous Given 03/27/17 1700)  diphenhydrAMINE (BENADRYL) injection 25 mg (25 mg Intravenous Given 03/27/17 1700)  ondansetron (ZOFRAN) injection 4 mg (4 mg Intravenous Given 03/27/17 1718)  hydrALAZINE (APRESOLINE) injection 10 mg (10 mg Intravenous Given 03/27/17 1835)  LORazepam (ATIVAN) injection 1 mg (1 mg Intravenous Given 03/27/17 2102)     Initial Impression / Assessment and Plan / ED Course  I have reviewed the triage vital signs and the nursing notes.  Pertinent labs & imaging results that were available during my care of the patient were reviewed by me and considered in my medical decision making (see chart for details).     61 year old female with a history of recent admission for her hypertension and headache of unknown etiology presents in the setting of headache,  hypertension, epigastric abdominal pain, nausea, vomiting. Patient reports she was doing well for several days after discharge until symptoms return today. On arrival patient found to have significantly elevated systolic blood pressures. Patient with no focal neurologic deficit but significant headache. Additionally patient with epigastric discomfort and nausea and vomiting. Patient given migraine cocktail and imaging of head revealed no acute intracranial abnormality. Laboratory analysis with elevation in lipase and no other significant abnormality. CT of abdomen was obtained without signs of early pancreatitis. Patient was given IV antihypertensives as well as several bouts of antibiotics and pain medication with improvement in symptoms.  At this time patient will require admission to medicine for further management of elevated blood pressure, pancreatitis. Patient will require ultrasound as inpatient to ensure no gallstone pancreatitis.  Patient stable at time of transfer of care.  Final Clinical Impressions(s) / ED Diagnoses   Final diagnoses:  Acute pancreatitis, unspecified complication status, unspecified pancreatitis type  Elevated blood pressure reading  Nausea and vomiting, intractability of vomiting not specified, unspecified vomiting type  Pancreatitis    New Prescriptions Current Discharge Medication List       Esaw Grandchild, MD 03/27/17 6808    Virgel Manifold, MD 04/01/17 [phone]

## 2017-03-27 NOTE — Telephone Encounter (Signed)
Called patient.  Vomiting bilious emesis uncontrollably with 10/10 headache.  Recommended ER eval asap.

## 2017-03-27 NOTE — Progress Notes (Signed)
Patient admitted to room 6N4  from ED with pancreatitis. Transported in bed, responsive to voice and able to follow commands. Vital signs stable. Husband at bedside. Denies pain at this time.

## 2017-03-27 NOTE — H&P (Signed)
History and Physical  Patient Name: Jill [name]     OEH:[id number]    DOB: 1956/04/09    DOA: 03/27/2017 PCP: Odette Fraction, MD   Patient coming from: Home  Chief Complaint: Vomiting, headache, hypertension  HPI: Jill [name] is a 61 y.o. female with a past medical history significant for pre-diabetes who presents with 2 days vomiting and now abdominal pain.  Interval history is collected from the patient's husband as the patient is sedated from lorazepam and will now wake up to answer questions.  She has a somewhat confusing recent history because of some tachyarrhythmias for which she was being worked up by her PCP, in the midst of which she developed severe headache hypertension and vomiting, and was admitted to the hospital 4 days ago. The main focus of that hospitalization was her hypertensive urgency and tachyarrhythmia. The former was paroxysmal, mostly controlled with metoprolol, which was started by cardiology in consultation for the latter, who called the arrhythmia either sinus or possibly an ectopic atrial arrhythmia.  The patient had no complaints of abdominal pain at all during her last hospitalization and her vomiting was brief, although notably, a CTA chest showed a dilated CBD 1.0 cm and very slightly elevated LFTs.  Since discharge, she has been back home.  It seems she is taking metoprolol, stil having intermittent hypertension.  Then last night, her vomiting and headache came back. The headache is intermittent, occurs when she has her abdominal pain, is abrupt in onset and offset, worse with movement. This morning vomiting was much worse.  She went to her PCP who recommended she go to the ER.  ED course: -Afebrile, heart rate 67, respirations and pulse ox normal, BP 196/72 -Na 137, K 3.4, Cr 0.75, WBC 16.4K, Hgb 13.1 -ALT 60, TBili normal -Lipase 113 -Ua showed glucose only -Troponin and lactate both negative -CT head showed only sinusitis -While in the ER she  began to complain of abdominal pain -CT abdomen showed early pancreatitis -She was given lorazepam, BP medication, and hydromorphone and Trh were asked to evaluate for admission         ROS: Review of Systems  Gastrointestinal: Positive for abdominal pain and vomiting.  Neurological: Positive for headaches.  All other systems reviewed and are negative.         Past Medical History:  Diagnosis Date  . Arthritis    "hands" (03/24/2017)  . Colon polyp   . High cholesterol   . PAT (paroxysmal atrial tachycardia) ([place])    [name] 03/24/2017  . Peptic ulcer    "when I was a child"  . PONV (postoperative nausea and vomiting)    "bad when they did my wisdom teeth"  . Pre-diabetes   . Seasonal allergies     Past Surgical History:  Procedure Laterality Date  . CERVICAL CERCLAGE  X 2  . COLONOSCOPY W/ BIOPSIES AND POLYPECTOMY    . TONSILLECTOMY    . WISDOM TOOTH EXTRACTION      Social History: Patient lives with her husband.  The patient walks unassisted.  She is not a smoker.  She is from [place].  She used to be a Astronomer.    Allergies  Allergen Reactions  . Codeine Nausea Only    Dizziness  . Contrast Media [Iodinated Diagnostic Agents] Hives  . Tessalon [Benzonatate] Other (See Comments)    Per pt this mixed with Tessalon made her throat close up  . Valium [Diazepam] Nausea Only  Dizziness  . Zyrtec [Cetirizine] Other (See Comments)    Per pt this mixed with Tessalon made her throat close up  . Penicillins Rash    Family history: family history includes Cancer in her maternal aunt and sister; Dementia in her mother; Diabetes in her father; Heart disease in her father; Hypertension in her father.  Prior to Admission medications   Medication Sig Start Date End Date Taking? Authorizing Provider  cholecalciferol (VITAMIN D) 1000 UNITS tablet Take 1,000 Units by mouth daily.   Yes Historical Provider, MD  metoprolol succinate (TOPROL-XL) 25 MG  24 hr tablet Take 1 tablet (25 mg total) by mouth daily. 03/25/17  Yes Nishant Dhungel, MD  Multiple Vitamin (MULTIVITAMIN WITH MINERALS) TABS tablet Take 1 tablet by mouth daily.   Yes Historical Provider, MD       Physical Exam: BP (!) 161/68   Pulse 65   Temp 98.2 F (36.8 C) (Oral)   Resp 16   SpO2 97%  General appearance: Well-developed, adult female, sleeping but rousable, in mild distress from pain.   Eyes: Anicteric, conjunctiva pink, lids and lashes normal. PERRL.    ENT: No nasal deformity, discharge, epistaxis.  Hearing normal. OP moist without lesions.   Neck: No neck masses.  Trachea midline.  No thyromegaly/tenderness. Lymph: No cervical or supraclavicular lymphadenopathy. Skin: Warm and dry.  No jaundice.  No suspicious rashes or lesions. Cardiac: Tachycardic, regular, nl S1-S2, no murmurs appreciated.  Capillary refill is brisk.  JVP normal.  No LE edema.  Radial and DP pulses 2+ and symmetric. Respiratory: Normal respiratory rate and rhythm.  CTAB without rales or wheezes. Abdomen: Abdomen soft.  Moderate diffuse TTP, more epigastric, with guarding, no rigidity. No ascites, distension, hepatosplenomegaly.   MSK: No deformities or effusions.  No cyanosis or clubbing. Neuro: PERRL, face symmetric, hearing normal. Moves extremities with normal coorindation.  Speech is fluent.  Muscle strength 5/5 and equal.    Psych: Sensorium intact and responding to questions, attention diminshed from lorazepam.  Behavior appropriate.  Affect blunted by medication.     Labs on Admission:  I have personally reviewed following labs and imaging studies: CBC:  Recent Labs Lab 03/23/17 2129 03/23/17 2155 03/24/17 0225 03/26/17 0957 03/27/17 1327  WBC 20.5*  --  22.9* 12.4* 16.4*  NEUTROABS 18.3*  --   --  10,292*  --   HGB 13.7 13.9 13.6 11.9* 13.1  HCT 41.1 41.0 40.6 36.3 40.5  MCV 80.6  --  79.8 81.4 80.7  PLT 375  --  334 369 297   Basic Metabolic Panel:  Recent Labs Lab  03/23/17 2129 03/23/17 2155 03/24/17 0225 03/26/17 0957 03/27/17 1327  NA 139 139 141 143 137  K 4.5 4.5 4.3 4.0 3.4*  CL 106 106 108 105 101  CO2 22  --  23 24 24   GLUCOSE 313* 315* 219* 211* 233*  BUN 19 22* 17 21 19   CREATININE 1.19* 1.00 0.93 0.73 0.75  CALCIUM 9.3  --  9.4 9.5 8.8*   GFR: Estimated Creatinine Clearance: 62.6 mL/min (by C-G formula based on SCr of 0.75 mg/dL).  Liver Function Tests:  Recent Labs Lab 03/23/17 2129 03/26/17 0957 03/27/17 1327  AST 33 24 36  ALT 26 37* 60*  ALKPHOS 76 62 71  BILITOT 0.8 0.5 0.5  PROT 7.0 5.8* 5.9*  ALBUMIN 3.8 3.8 3.6    Recent Labs Lab 03/23/17 2129 03/27/17 1327  LIPASE 22 113*   No results for input(s):  AMMONIA in the last 168 hours. Coagulation Profile: No results for input(s): INR, PROTIME in the last 168 hours. Cardiac Enzymes: No results for input(s): CKTOTAL, CKMB, CKMBINDEX, TROPONINI in the last 168 hours. BNP (last 3 results) No results for input(s): PROBNP in the last 8760 hours. HbA1C: No results for input(s): HGBA1C in the last 72 hours. CBG:  Recent Labs Lab 03/24/17 0247 03/24/17 0846 03/24/17 1131 03/24/17 1609  GLUCAP 195* 208* 183* 168*   Lipid Profile: No results for input(s): CHOL, HDL, LDLCALC, TRIG, CHOLHDL, LDLDIRECT in the last 72 hours. Thyroid Function Tests: No results for input(s): TSH, T4TOTAL, FREET4, T3FREE, THYROIDAB in the last 72 hours. Anemia Panel: No results for input(s): VITAMINB12, FOLATE, FERRITIN, TIBC, IRON, RETICCTPCT in the last 72 hours. Sepsis Labs: Invalid input(s): PROCALCITONIN, LACTICIDVEN No results found for this or any previous visit (from the past 240 hour(s)).       Radiological Exams on Admission: Personally reviewed CT abdomen and CT head reports: Ct Abdomen Pelvis Wo Contrast  Result Date: 03/27/2017 CLINICAL DATA:  Vomiting, fever, headache and hypertension. One day duration. EXAM: CT ABDOMEN AND PELVIS WITHOUT CONTRAST TECHNIQUE:  Multidetector CT imaging of the abdomen and pelvis was performed following the standard protocol without IV contrast. COMPARISON:  None. FINDINGS: Lower chest: Normal except for minimal atelectasis/scar. Hepatobiliary: Normal Pancreas: Probable orally pancreatitis with mild edema and swelling in the region the pancreatic head. Spleen: Normal Adrenals/Urinary Tract: Adrenal glands are normal. Kidneys are normal. No cyst, mass, stone or hydronephrosis. Stomach/Bowel: Cannot rule out antral gastritis with wall thickening. This is not a definite finding. No other intestinal abnormality. Appendix is normal. Vascular/Lymphatic: Aortic atherosclerosis. No aneurysm. IVC is normal. No retroperitoneal adenopathy. Reproductive: Normal Other: No free fluid or air. Musculoskeletal: Normal IMPRESSION: Probable early pancreatitis with mild pancreatic swelling and surrounding edema. Cannot rule out antral gastritis.  This is not a certain finding. Aortic atherosclerosis. Electronically Signed   By: Nelson Chimes M.D.   On: 03/27/2017 18:38   Ct Head Wo Contrast  Result Date: 03/27/2017 CLINICAL DATA:  Headache. Hypertension. Symptoms of 1 day duration. EXAM: CT HEAD WITHOUT CONTRAST TECHNIQUE: Contiguous axial images were obtained from the base of the skull through the vertex without intravenous contrast. COMPARISON:  03/23/2017 FINDINGS: Brain: No evidence of malformation, atrophy, old or acute small or large vessel infarction, mass lesion, hemorrhage, hydrocephalus or extra-axial collection. No evidence of pituitary lesion. Vascular: There is atherosclerotic calcification of the major vessels at the base of the brain. Skull: Normal.  No fracture or focal bone lesion. Sinuses/Orbits: Opacification of the sphenoid sinus which could be a cause of headache. Mild mucosal thickening of the left maxillary sinus. Orbits negative. Other: None significant IMPRESSION: Normal appearance of the brain itself. Sphenoid sinusitis which could  be a cause of headache. Electronically Signed   By: Nelson Chimes M.D.   On: 03/27/2017 18:39    EKG: Independently reviewed. Rate 61, RSR' pattern, QTc auto-read 533, ?U wave instead.  Similar to previous.  Echocardiogram Apr 2018: Report reviewed EF 65-70% Mild LVH Grade I DD         Assessment/Plan  1. Acute pancreatitis:  Suspect gallstone. CT the other day showed dilated CBD.  Transaminases mildly elevated   -NPO, MIVF -Hydromorphone for pain, lorazepam for nausea given QT -RUQ [place] ordered -Trend transaminases   2. Probably diabetes:  Has hx of prediabetes, HgbA1c 6% always.  Recently has had hyperglycemia >500 -SSI q6hrs if needed  3. Prolonged QT  interval versus U waves:  TSH is normal, K mildly low -Supplement K -Check mag -Avoid QT prolonging meds for now -Repeat ECG tomorrow  4. Adrenal nodule:  -Outpatient follow-up  5. Hypertensive urgency:  -Continue oral metoprolol for atrial arrhythmia -Labetalol PRN for severe hypertension  6. Headache: In setting of vomiting.  Two CTs normal.  Clinical suspicion for [facility] low.         DVT prophylaxis: Lovenox  Code Status: FULL  Family Communication: Husband at bedside  Disposition Plan: Anticipate IV fluids, conservative mgmt of pancreatitis.  Follow up liver studies. Consults called: None Admission status: INAPTIENT         Medical decision making: Patient seen at 9:53 PM on 03/27/2017.  The patient was discussed with Dr. Jearld Pies.  What exists of the patient's chart was reviewed in depth and summarized above.  Clinical condition: stable.        Edwin Dada [facility] Pager [phone]

## 2017-03-27 NOTE — ED Notes (Signed)
Pt ambulatory to restroom

## 2017-03-27 NOTE — Telephone Encounter (Signed)
Patient was seen in the office today for a blood pressure check which was 140/78 right arm, pulse 66 O2 99.  Patient states she was in the hospital on 4-23 and put on Doxycycline 100 mg 1po bid and Metoprolol succinate 25mg  1 po daily.Since being on these two medications patient states she has developed stomach pains, bloating and vomiting that has worsened and it  started Tuesday . Patient feels it is the Doxycycline that is causing the problems. Pls advise on changing the medication.

## 2017-03-27 NOTE — Telephone Encounter (Signed)
Patient was seen in the office today for her bloop pressure check at the time it was 140/78. Patient did complain of stomach pain and vomiting while in the office and thought it might have come from her doxycycline. I did forward this information along with her vitals to her PCP and I told patient I would follow up with her.  Patient has since called back sounding like she is was having SOB patient states she is having a real bad headache, vomiting, sweating , stomach pain. I asked patient when did the headache start she states she was fine when she came into the office, but afterwards her head started hurting.   I asked patient if she had pain anywhere else patient said no, I asked could she see clear patient states her vision is not blurry. Patient states she continues to have vomiting. I told patient she needed to go to the ER to get checked out. I also spoke with PCP and made him aware of what was going on he informed me that he called patient and agreed she needed to go to ER.

## 2017-03-27 NOTE — ED Triage Notes (Signed)
Pt here for HA & vomiting x 4 today recently seen at PCP today for the same, pt admitted to this hospital earlier this week for SOB, pt states, "I have been vomiting up bile today. It may be the result of taking some medicines.  pt A&O x4

## 2017-03-28 DIAGNOSIS — R109 Unspecified abdominal pain: Secondary | ICD-10-CM

## 2017-03-28 DIAGNOSIS — K859 Acute pancreatitis without necrosis or infection, unspecified: Principal | ICD-10-CM

## 2017-03-28 LAB — GLUCOSE, CAPILLARY
GLUCOSE-CAPILLARY: 149 mg/dL — AB (ref 65–99)
GLUCOSE-CAPILLARY: 197 mg/dL — AB (ref 65–99)
Glucose-Capillary: 142 mg/dL — ABNORMAL HIGH (ref 65–99)

## 2017-03-28 LAB — CBC
HCT: 37.8 % (ref 36.0–46.0)
HEMOGLOBIN: 12.7 g/dL (ref 12.0–15.0)
MCH: 26.6 pg (ref 26.0–34.0)
MCHC: 33.6 g/dL (ref 30.0–36.0)
MCV: 79.2 fL (ref 78.0–100.0)
PLATELETS: 281 10*3/uL (ref 150–400)
RBC: 4.77 MIL/uL (ref 3.87–5.11)
RDW: 13.3 % (ref 11.5–15.5)
WBC: 11.4 10*3/uL — AB (ref 4.0–10.5)

## 2017-03-28 LAB — COMPREHENSIVE METABOLIC PANEL
ALT: 40 U/L (ref 14–54)
ANION GAP: 9 (ref 5–15)
AST: 20 U/L (ref 15–41)
Albumin: 2.8 g/dL — ABNORMAL LOW (ref 3.5–5.0)
Alkaline Phosphatase: 61 U/L (ref 38–126)
BILIRUBIN TOTAL: 0.4 mg/dL (ref 0.3–1.2)
BUN: 13 mg/dL (ref 6–20)
CALCIUM: 8 mg/dL — AB (ref 8.9–10.3)
CO2: 26 mmol/L (ref 22–32)
Chloride: 102 mmol/L (ref 101–111)
Creatinine, Ser: 0.71 mg/dL (ref 0.44–1.00)
Glucose, Bld: 153 mg/dL — ABNORMAL HIGH (ref 65–99)
Potassium: 3.2 mmol/L — ABNORMAL LOW (ref 3.5–5.1)
SODIUM: 137 mmol/L (ref 135–145)
TOTAL PROTEIN: 5 g/dL — AB (ref 6.5–8.1)

## 2017-03-28 LAB — MAGNESIUM: Magnesium: 1.9 mg/dL (ref 1.7–2.4)

## 2017-03-28 MED ORDER — ORAL CARE MOUTH RINSE
15.0000 mL | Freq: Two times a day (BID) | OROMUCOSAL | Status: DC
  Administered 2017-03-28 – 2017-03-29 (×2): 15 mL via OROMUCOSAL

## 2017-03-28 MED ORDER — LEVOFLOXACIN 500 MG PO TABS
500.0000 mg | ORAL_TABLET | Freq: Every day | ORAL | Status: DC
  Administered 2017-03-28 – 2017-03-30 (×3): 500 mg via ORAL
  Filled 2017-03-28 (×3): qty 1

## 2017-03-28 MED ORDER — SODIUM CHLORIDE 0.9 % IV SOLN
30.0000 meq | Freq: Once | INTRAVENOUS | Status: AC
  Administered 2017-03-28: 30 meq via INTRAVENOUS
  Filled 2017-03-28: qty 15

## 2017-03-28 MED ORDER — TRAMADOL HCL 50 MG PO TABS
50.0000 mg | ORAL_TABLET | Freq: Four times a day (QID) | ORAL | Status: DC | PRN
  Administered 2017-03-28: 50 mg via ORAL
  Filled 2017-03-28: qty 1

## 2017-03-28 NOTE — Progress Notes (Signed)
Patient ID: Jill [name], female   DOB: Apr 05, 1956, 61 y.o.   MRN: [id number]                                                                PROGRESS NOTE                                                                                                                                                                                                             Patient Demographics:    Jill [name], is a 61 y.o. female, DOB - September 09, 1956, GDJ:[id number]  Admit date - 03/27/2017   Admitting Physician Edwin Dada, MD  Outpatient Primary MD for the patient is Odette Fraction, MD  LOS - 1  Outpatient Specialists:    Chief Complaint  Patient presents with  . Headache  . Emesis       Brief Narrative  61 y.o. female with a past medical history significant for pre-diabetes who presents with 2 days vomiting and now abdominal pain.  Interval history is collected from the patient's husband as the patient is sedated from lorazepam and will now wake up to answer questions.  She has a somewhat confusing recent history because of some tachyarrhythmias for which she was being worked up by her PCP, in the midst of which she developed severe headache hypertension and vomiting, and was admitted to the hospital 4 days ago. The main focus of that hospitalization was her hypertensive urgency and tachyarrhythmia. The former was paroxysmal, mostly controlled with metoprolol, which was started by cardiology in consultation for the latter, who called the arrhythmia either sinus or possibly an ectopic atrial arrhythmia.  The patient had no complaints of abdominal pain at all during her last hospitalization and her vomiting was brief, although notably, a CTA chest showed a dilated CBD 1.0 cm and very slightly elevated LFTs.  Since discharge, she has been back home.  It seems she is taking metoprolol, stil having intermittent hypertension.  Then last night, her vomiting and headache came back. The headache is  intermittent, occurs when she has her abdominal pain, is abrupt in onset and offset, worse with movement. This morning vomiting was much worse.  She went to her PCP who recommended she go to the ER  ED course: -Afebrile, heart rate 67, respirations and  pulse ox normal, BP 196/72 -Na 137, K 3.4, Cr 0.75, WBC 16.4K, Hgb 13.1 -ALT 60, TBili normal -Lipase 113 -Ua showed glucose only -Troponin and lactate both negative -CT head showed only sinusitis -While in the ER she began to complain of abdominal pain -CT abdomen showed early pancreatitis -She was given lorazepam, BP medication, and hydromorphone and Trh were asked to evaluate for admission .   Subjective:    [name] today has been feeling better , less abdominal pain.  Denies fever, chills, n/v, diarrhea, brbpr, black stool. , No headache, No chest pain, No new weakness tingling or numbness, No Cough - SOB.    Assessment  & Plan :    Principal Problem:   Acute pancreatitis without infection or necrosis Active Problems:   Hypertensive urgency   Hyperglycemia   Atrial tachycardia, paroxysmal ([place])   1. Acute pancreatitis:  RUQ negative for cholelithiasis ? Medication induced. Pt denies etoh use.   Will try to advance to full liquid diet later Check cbc, cmp, lipase in am  2. Probably diabetes:  Has hx of prediabetes, HgbA1c 6% always.  Recently has had hyperglycemia >500 -SSI q6hrs if needed  3. Prolonged QT interval versus U waves:  TSH is normal, K mildly low -Supplement K -Check mag -Avoid QT prolonging meds for now -Repeat ECG tomorrow  4. Adrenal nodule:  -Outpatient follow-up  5. Hypertensive urgency:  -Continue oral metoprolol for atrial arrhythmia -Labetalol PRN for severe hypertension  6. Headache: In setting of vomiting.  Two CTs normal.  Clinical suspicion for [facility] low. CT scan showed sphenoid sinusits tx levaquin 500mg  po qday for sinusitis       Lab Results  Component Value Date     PLT 281 03/28/2017      Anti-infectives    None        Objective:   Vitals:   03/27/17 1855 03/27/17 2221 03/27/17 2300 03/28/17 0514  BP: (!) 161/68 (!) 147/62    Pulse: 65 74  63  Resp: 16 17  18   Temp:  (!) 100.8 F (38.2 C) 100 F (37.8 C) 98.9 F (37.2 C)  TempSrc:  Oral Oral Oral  SpO2: 97% 98%  97%  Weight:  61.9 kg (136 lb 8 oz)    Height:  5\' 3"  (1.6 m)      Wt Readings from Last 3 Encounters:  03/27/17 61.9 kg (136 lb 8 oz)  03/26/17 62.1 kg (137 lb)  03/24/17 61.1 kg (134 lb 9.6 oz)     Intake/Output Summary (Last 24 hours) at 03/28/17 4627 Last data filed at 03/28/17 0700  Gross per 24 hour  Intake             2420 ml  Output              500 ml  Net             1920 ml     Physical Exam  Awake Alert, Oriented X 3, No new F.N deficits, Normal affect [place].AT,PERRALm, slight frontal sinus tenderness Supple Neck,No JVD, No cervical lymphadenopathy appriciated.  Symmetrical Chest wall movement, Good air movement bilaterally, CTAB RRR,No Gallops,Rubs or new Murmurs, No Parasternal Heave +ve B.Sounds, Abd Soft, No tenderness, No organomegaly appriciated, No rebound - guarding or rigidity. No Cyanosis, Clubbing or edema, No new Rash or bruise      Data Review:    CBC  Recent Labs Lab 03/23/17 2129 03/23/17 2155 03/24/17 0225 03/26/17 0957 03/27/17  1327 03/28/17 0021  WBC 20.5*  --  22.9* 12.4* 16.4* 11.4*  HGB 13.7 13.9 13.6 11.9* 13.1 12.7  HCT 41.1 41.0 40.6 36.3 40.5 37.8  PLT 375  --  334 369 390 281  MCV 80.6  --  79.8 81.4 80.7 79.2  MCH 26.9  --  26.7 26.7* 26.1 26.6  MCHC 33.3  --  33.5 32.8 32.3 33.6  RDW 13.1  --  13.3 14.3 13.2 13.3  LYMPHSABS 1.0  --   --  1,240  --   --   MONOABS 1.2*  --   --  868  --   --   EOSABS 0.0  --   --  0*  --   --   BASOSABS 0.0  --   --  0  --   --     Chemistries   Recent Labs Lab 03/23/17 2129 03/23/17 2155 03/24/17 0225 03/26/17 0957 03/27/17 1327 03/28/17 0021  NA 139 139 141  143 137 137  K 4.5 4.5 4.3 4.0 3.4* 3.2*  CL 106 106 108 105 101 102  CO2 22  --  23 24 24 26   GLUCOSE 313* 315* 219* 211* 233* 153*  BUN 19 22* 17 21 19 13   CREATININE 1.19* 1.00 0.93 0.73 0.75 0.71  CALCIUM 9.3  --  9.4 9.5 8.8* 8.0*  MG  --   --   --   --   --  1.9  AST 33  --   --  24 36 20  ALT 26  --   --  37* 60* 40  ALKPHOS 76  --   --  62 71 61  BILITOT 0.8  --   --  0.5 0.5 0.4   ------------------------------------------------------------------------------------------------------------------ No results for input(s): CHOL, HDL, LDLCALC, TRIG, CHOLHDL, LDLDIRECT in the last 72 hours.  Lab Results  Component Value Date   HGBA1C 6.1 (H) 03/10/2017   ------------------------------------------------------------------------------------------------------------------ No results for input(s): TSH, T4TOTAL, T3FREE, THYROIDAB in the last 72 hours.  Invalid input(s): FREET3 ------------------------------------------------------------------------------------------------------------------ No results for input(s): VITAMINB12, FOLATE, FERRITIN, TIBC, IRON, RETICCTPCT in the last 72 hours.  Coagulation profile No results for input(s): INR, PROTIME in the last 168 hours.  No results for input(s): DDIMER in the last 72 hours.  Cardiac Enzymes No results for input(s): CKMB, TROPONINI, MYOGLOBIN in the last 168 hours.  Invalid input(s): CK ------------------------------------------------------------------------------------------------------------------ No results found for: BNP  Inpatient Medications  Scheduled Meds: . enoxaparin (LOVENOX) injection  40 mg Subcutaneous Q24H  . insulin aspart  0-9 Units Subcutaneous Q6H  . mouth rinse  15 mL Mouth Rinse BID  . metoprolol succinate  25 mg Oral Daily   Continuous Infusions: . 0.9 % NaCl with KCl 20 mEq / L 125 mL/hr at 03/28/17 0857  . potassium chloride (KCL MULTIRUN) 30 mEq in 265 mL IVPB     PRN Meds:.HYDROmorphone (DILAUDID)  injection, labetalol  Micro Results No results found for this or any previous visit (from the past 240 hour(s)).  Radiology Reports Ct Abdomen Pelvis Wo Contrast  Result Date: 03/27/2017 CLINICAL DATA:  Vomiting, fever, headache and hypertension. One day duration. EXAM: CT ABDOMEN AND PELVIS WITHOUT CONTRAST TECHNIQUE: Multidetector CT imaging of the abdomen and pelvis was performed following the standard protocol without IV contrast. COMPARISON:  None. FINDINGS: Lower chest: Normal except for minimal atelectasis/scar. Hepatobiliary: Normal Pancreas: Probable orally pancreatitis with mild edema and swelling in the region the pancreatic head. Spleen: Normal Adrenals/Urinary Tract: Adrenal glands are  normal. Kidneys are normal. No cyst, mass, stone or hydronephrosis. Stomach/Bowel: Cannot rule out antral gastritis with wall thickening. This is not a definite finding. No other intestinal abnormality. Appendix is normal. Vascular/Lymphatic: Aortic atherosclerosis. No aneurysm. IVC is normal. No retroperitoneal adenopathy. Reproductive: Normal Other: No free fluid or air. Musculoskeletal: Normal IMPRESSION: Probable early pancreatitis with mild pancreatic swelling and surrounding edema. Cannot rule out antral gastritis.  This is not a certain finding. Aortic atherosclerosis. Electronically Signed   By: Nelson Chimes M.D.   On: 03/27/2017 18:38   Dg Chest 2 View  Result Date: 03/23/2017 CLINICAL DATA:  Tachycardia. Nausea and vomiting. Headache and cough. EXAM: CHEST  2 VIEW COMPARISON:  None. FINDINGS: Artifact overlies chest. Heart size is normal. Mediastinal shadows are normal. The lungs are clear. No effusions. Minimal spinal curvature. No acute bone finding. IMPRESSION: No active cardiopulmonary disease. Electronically Signed   By: Nelson Chimes M.D.   On: 03/23/2017 20:37   Ct Head Wo Contrast  Result Date: 03/27/2017 CLINICAL DATA:  Headache. Hypertension. Symptoms of 1 day duration. EXAM: CT HEAD  WITHOUT CONTRAST TECHNIQUE: Contiguous axial images were obtained from the base of the skull through the vertex without intravenous contrast. COMPARISON:  03/23/2017 FINDINGS: Brain: No evidence of malformation, atrophy, old or acute small or large vessel infarction, mass lesion, hemorrhage, hydrocephalus or extra-axial collection. No evidence of pituitary lesion. Vascular: There is atherosclerotic calcification of the major vessels at the base of the brain. Skull: Normal.  No fracture or focal bone lesion. Sinuses/Orbits: Opacification of the sphenoid sinus which could be a cause of headache. Mild mucosal thickening of the left maxillary sinus. Orbits negative. Other: None significant IMPRESSION: Normal appearance of the brain itself. Sphenoid sinusitis which could be a cause of headache. Electronically Signed   By: Nelson Chimes M.D.   On: 03/27/2017 18:39   Ct Head Wo Contrast  Result Date: 03/23/2017 CLINICAL DATA:  Intermittent severe headache.  Hypertension. EXAM: CT HEAD WITHOUT CONTRAST TECHNIQUE: Contiguous axial images were obtained from the base of the skull through the vertex without intravenous contrast. COMPARISON:  None. FINDINGS: BRAIN: No intraparenchymal hemorrhage, mass effect nor midline shift. The ventricles and sulci are normal. No acute large vascular territory infarcts. No abnormal extra-axial fluid collections. Basal cisterns are patent. VASCULAR: Unremarkable. SKULL/SOFT TISSUES: No skull fracture. Osteopenia. No significant soft tissue swelling. ORBITS/SINUSES: The included ocular globes and orbital contents are normal.Moderate paranasal sinus mucosal thickening. Mastoid air cells are well aerated. OTHER: None. IMPRESSION: Normal CT HEAD. Moderate paranasal sinusitis. Electronically Signed   By: Elon Alas M.D.   On: 03/23/2017 21:11   Ct Angio Chest/abd/pel For Dissection W And/or Wo Contrast  Result Date: 03/24/2017 CLINICAL DATA:  Acute onset of palpitations and severe  headache. High blood pressure. Vomiting. Nausea and dizziness. Initial encounter. EXAM: CT ANGIOGRAPHY CHEST, ABDOMEN AND PELVIS TECHNIQUE: Multidetector CT imaging through the chest, abdomen and pelvis was performed using the standard protocol during bolus administration of intravenous contrast. Multiplanar reconstructed images and MIPs were obtained and reviewed to evaluate the vascular anatomy. CONTRAST:  100 mL of Isovue 370 IV contrast COMPARISON:  None. FINDINGS: CTA CHEST FINDINGS Cardiovascular: There is no evidence of aortic dissection. There is no evidence of aneurysmal dilatation. Minimal calcification is noted along the aortic arch. The great vessels are grossly unremarkable in appearance. There is no evidence of significant pulmonary embolus. The heart is normal in size. The thoracic aorta is otherwise unremarkable in appearance. Mediastinum/Nodes: The mediastinum is  unremarkable in appearance. No mediastinal lymphadenopathy is seen. No pericardial effusion is identified. The visualized portions of the thyroid gland are unremarkable. No axillary lymphadenopathy is seen. Lungs/Pleura: Hazy bilateral lower lobe airspace opacities, right greater than left, raise concern for multifocal pneumonia. There is trace opacification of bronchioles to both lower lung lobes, raising question for mild aspiration. No pleural effusion or pneumothorax is seen. No dominant mass is identified. Musculoskeletal: No acute osseous abnormalities are identified. The visualized musculature is unremarkable in appearance. Review of the MIP images confirms the above findings. CTA ABDOMEN AND PELVIS FINDINGS VASCULAR Aorta: There is no evidence of aortic dissection. There is no evidence of aneurysmal dilatation. Mild scattered calcification is seen along the abdominal aorta. Celiac: The celiac trunk is unremarkable in appearance. SMA: The superior mesenteric artery appears fully patent. Renals: The renal arteries appear intact  bilaterally. IMA: The inferior mesenteric artery is unremarkable in appearance. Inflow: The common, internal and external iliac arteries appear fully patent bilaterally. The common femoral arteries and their proximal branches are unremarkable in appearance. Veins: The visualized venous structures are grossly unremarkable. The inferior vena cava is within normal limits. Review of the MIP images confirms the above findings. NON-VASCULAR Hepatobiliary: The liver is grossly unremarkable in appearance. Trace pericholecystic fluid is noted. The gallbladder is otherwise grossly unremarkable. The common bile duct is distended to 1.0 cm in diameter, raising concern for distal obstruction. Pancreas: The pancreas is within normal limits. Spleen: The spleen is unremarkable in appearance. Adrenals/Urinary Tract: A 2.8 cm right adrenal nodule is noted, not well characterized on this study. The left adrenal gland is unremarkable in appearance. A tiny left renal cyst is suggested. There is no evidence of hydronephrosis. No renal or ureteral stones are identified. No perinephric stranding is seen. Stomach/Bowel: The stomach is unremarkable in appearance. The small bowel is within normal limits. The appendix is normal in caliber, without evidence of appendicitis. The colon is unremarkable in appearance. Lymphatic: No retroperitoneal or pelvic sidewall lymphadenopathy is seen. Reproductive: The bladder is moderately distended and grossly unremarkable. The uterus is unremarkable in appearance. The ovaries are relatively symmetric. No suspicious adnexal masses are seen. Other: No additional soft tissue abnormalities are seen. Musculoskeletal: No acute osseous abnormalities are identified. The visualized musculature is unremarkable in appearance. Review of the MIP images confirms the above findings. IMPRESSION: 1. No evidence of aortic dissection. No evidence of aneurysmal dilatation. 2. No evidence of significant pulmonary embolus. 3.  Dilatation of the common bile duct to 1.0 cm in diameter, with trace pericholecystic fluid. This raises concern for distal obstruction. Would correlate with LFTs. MRCP or ERCP could be considered for further evaluation, as deemed clinically appropriate. 4. Hazy bilateral lower lobe airspace opacities, right greater than left, raise concern for multifocal pneumonia. Trace opacification of bronchioles to both lower lung lobes. Given the patient's recent vomiting, this is concerning for mild aspiration. 5. **An incidental finding of potential clinical significance has been found. 2.8 cm right adrenal nodule noted. Would correlate with adrenal labs, and consider adrenal protocol MRI or CT for further evaluation, when and as deemed clinically appropriate.** 6. Mild aortic atherosclerosis. 7. Tiny left renal cyst suggested. Electronically Signed   By: Garald Balding M.D.   On: 03/24/2017 01:04   US Abdomen Limited Ruq  Result Date: 03/27/2017 CLINICAL DATA:  Pancreatitis EXAM: US ABDOMEN LIMITED - RIGHT UPPER QUADRANT COMPARISON:  CT from 03/27/2017 FINDINGS: Gallbladder: No gallstones or wall thickening visualized. No sonographic Murphy sign noted by sonographer.  Common bile duct: Diameter: 3 mm at the level of hepatic artery. No choledocholithiasis. Liver: No focal lesion identified. Within normal limits in parenchymal echogenicity. IMPRESSION: No acute sonographic abnormality in the right upper quadrant of the abdomen. Electronically Signed   By: Ashley Royalty M.D.   On: 03/27/2017 23:16    Time Spent in minutes  30   Jani Gravel M.D on 03/28/2017 at 9:22 AM  Between 7am to 7pm - Pager - [phone]  After 7pm go to [email or web address] - password [facility]  [facility] -  Office  [phone]

## 2017-03-29 LAB — COMPREHENSIVE METABOLIC PANEL
ALT: 31 U/L (ref 14–54)
AST: 16 U/L (ref 15–41)
Albumin: 2.6 g/dL — ABNORMAL LOW (ref 3.5–5.0)
Alkaline Phosphatase: 54 U/L (ref 38–126)
Anion gap: 7 (ref 5–15)
BUN: 6 mg/dL (ref 6–20)
CHLORIDE: 105 mmol/L (ref 101–111)
CO2: 28 mmol/L (ref 22–32)
CREATININE: 0.63 mg/dL (ref 0.44–1.00)
Calcium: 8.7 mg/dL — ABNORMAL LOW (ref 8.9–10.3)
GFR calc Af Amer: >60 mL/min (ref >60)
Glucose, Bld: 125 mg/dL — ABNORMAL HIGH (ref 65–99)
Potassium: 4.1 mmol/L (ref 3.5–5.1)
Sodium: 140 mmol/L (ref 135–145)
Total Bilirubin: 0.6 mg/dL (ref 0.3–1.2)
Total Protein: 5.1 g/dL — ABNORMAL LOW (ref 6.5–8.1)

## 2017-03-29 LAB — LIPASE, BLOOD: LIPASE: 24 U/L (ref 11–51)

## 2017-03-29 LAB — GLUCOSE, CAPILLARY
Glucose-Capillary: 126 mg/dL — ABNORMAL HIGH (ref 65–99)
Glucose-Capillary: 122 mg/dL — ABNORMAL HIGH (ref 65–99)
Glucose-Capillary: 137 mg/dL — ABNORMAL HIGH (ref 65–99)
Glucose-Capillary: 120 mg/dL — ABNORMAL HIGH (ref 65–99)

## 2017-03-29 LAB — CBC
HEMATOCRIT: 38.4 % (ref 36.0–46.0)
HEMOGLOBIN: 12.5 g/dL (ref 12.0–15.0)
MCH: 26.1 pg (ref 26.0–34.0)
MCHC: 32.6 g/dL (ref 30.0–36.0)
MCV: 80.2 fL (ref 78.0–100.0)
PLATELETS: 267 10*3/uL (ref 150–400)
RBC: 4.79 MIL/uL (ref 3.87–5.11)
RDW: 13.4 % (ref 11.5–15.5)
WBC: 8.6 10*3/uL (ref 4.0–10.5)

## 2017-03-29 NOTE — Progress Notes (Signed)
Patient ID: Jill [name], female   DOB: Dec 08, 1955, 61 y.o.   MRN: [id number]                                                                PROGRESS NOTE                                                                                                                                                                                                             Patient Demographics:    Jill [name], is a 61 y.o. female, DOB - [birth year], KTL:[id number]  Admit date - 03/27/2017   Admitting Physician Edwin Dada, MD  Outpatient Primary MD for the patient is Odette Fraction, MD  LOS - 2  Outpatient Specialists:     Chief Complaint  Patient presents with  . Headache  . Emesis       Brief Narrative  61 y.o.femalewith a past medical history significant for pre-diabeteswho presents with 2 days vomiting and now abdominal pain.  Interval history is collected from the patient's husband as the patient is sedated from lorazepam and will now wake up to answer questions.  She has a somewhat confusing recent history because of some tachyarrhythmias for which she was being worked up by her PCP, in the midst of which she developed severe headache hypertension and vomiting, and was admitted to the hospital 4 days ago. The main focus of that hospitalization was her hypertensive urgency and tachyarrhythmia.The former was paroxysmal, mostly controlled with metoprolol, which was started by cardiology in consultation for the latter, who called the arrhythmia either sinus or possibly an ectopic atrial arrhythmia. The patient had no complaints of abdominal pain at all during her last hospitalization and her vomiting was brief, although notably, a CTA chest showed a dilated CBD 1.0 cm and very slightly elevated LFTs.  Since discharge, she has been back home. It seems she is taking metoprolol, stil having intermittent hypertension. Then last night, her vomiting and headache came back. The headache is  intermittent, occurs when she has her abdominal pain, is abrupt in onset and offset, worse with movement.This morning vomitingwas much worse. She went to her PCP who recommended she go to the ER  ED course: -Afebrile, heart rate 67, respirations and pulse ox normal, BP 196/72 -Na 137, K 3.4,  Cr 0.75, WBC 16.4K, Hgb 13.1 -ALT 60,TBili normal -Lipase 113 -Ua showed glucose only -Troponin and lactate both negative -CT head showed only sinusitis -While in the ER she began to complain of abdominal pain -CT abdomen showed early pancreatitis -She was given lorazepam, BP medication, and hydromorphone and Trh were asked to evaluate for admission .    Subjective:    [name] today states that abdominal pain has effectively resolved.  Pt still having headache,  Mostly frontal.  Slight nasal drainage.  Postnasal drip.  No chest pain, No abdominal pain - No Nausea, No Diarrhea.  No new weakness tingling or numbness, No Cough - SOB.    Assessment  & Plan :    Principal Problem:   Acute pancreatitis without infection or necrosis Active Problems:   Hypertensive urgency   Hyperglycemia   Atrial tachycardia, paroxysmal (HCC)   Abdominal pain   1. Acute pancreatitis:(no family hx) CT scan abd/ pevlis 4/27=>probable early pancreatitis withh mild pancreatic  Swelling and surround edema RUQ 4/27 =>negative for cholelithiasis ? Medication induced (doxycycline, prednisone). Pt denies etoh use.   Will try to advance to full diet  Decrease ivf and d/c later today Check cbc, cmp, lipase in am  2. Abnormal liver function resolved Check acute hepatitis panel  3. Probably diabetes: Has hx of prediabetes, HgbA1c 6% always. Recently has had hyperglycemia >500 -SSI q6hrs if needed  4. Prolonged QT interval versus U waves: TSH is normal, K mildly low on admission -repleted K, -Mag wnl -Avoid QT prolonging meds for now  5. Adrenal nodule: on CT abd/pelvis 4/24 Outpatient  follow-up  6. Hypertensive urgency: -Continue oral metoprolol for atrial arrhythmia -Labetalol PRN for severe hypertension bp slightly high today, might be due to ivf,    7.  Headache: CT scan showed sphenoid sinusits tx levaquin 500mg  po qday for sinusitis  4/28=>    Code Status : FULL CODE  Family Communication  : w patient  Disposition Plan  : home tomorrow  Barriers For Discharge :   Consults  :  none  Procedures  : CT abd/pelvis 4/27, RUQ 4/27, CT brain 4/27  DVT Prophylaxis  :  Lovenox - Heparin - SCDs   Lab Results  Component Value Date   PLT 267 03/29/2017    Antibiotics  :  Levaquin 4/28=>  Anti-infectives    Start     Dose/Rate Route Frequency Ordered Stop   03/28/17 1230  levofloxacin (LEVAQUIN) tablet 500 mg    Comments:  For sphenoid sinusitis   500 mg Oral Daily 03/28/17 1228          Objective:   Vitals:   03/28/17 2029 03/29/17 0000 03/29/17 0227 03/29/17 0431  BP: (!) 204/84 (!) 187/79 (!) 167/81 (!) 158/75  Pulse:  (!) 52  (!) 55  Resp:    18  Temp:    98.2 F (36.8 C)  TempSrc:    Oral  SpO2:    100%  Weight:      Height:        Wt Readings from Last 3 Encounters:  03/27/17 61.9 kg (136 lb 8 oz)  03/26/17 62.1 kg (137 lb)  03/24/17 61.1 kg (134 lb 9.6 oz)     Intake/Output Summary (Last 24 hours) at 03/29/17 0731 Last data filed at 03/29/17 0433  Gross per 24 hour  Intake          1480.83 ml  Output  3800 ml  Net         -2319.17 ml     Physical Exam  Awake Alert, Oriented X 3, No new F.N deficits, Normal affect [place].AT,PERRAL, no sinus tenderness Supple Neck,No JVD, No cervical lymphadenopathy appriciated.  Symmetrical Chest wall movement, Good air movement bilaterally, CTAB RRR,No Gallops,Rubs or new Murmurs, No Parasternal Heave +ve B.Sounds, Abd Soft, No tenderness, No organomegaly appriciated, No rebound - guarding or rigidity. No Cyanosis, Clubbing or edema, No new Rash or bruise      Data  Review:    CBC  Recent Labs Lab 03/23/17 2129  03/24/17 0225 03/26/17 0957 03/27/17 1327 03/28/17 0021 03/29/17 0404  WBC 20.5*  --  22.9* 12.4* 16.4* 11.4* 8.6  HGB 13.7  < > 13.6 11.9* 13.1 12.7 12.5  HCT 41.1  < > 40.6 36.3 40.5 37.8 38.4  PLT 375  --  334 369 390 281 267  MCV 80.6  --  79.8 81.4 80.7 79.2 80.2  MCH 26.9  --  26.7 26.7* 26.1 26.6 26.1  MCHC 33.3  --  33.5 32.8 32.3 33.6 32.6  RDW 13.1  --  13.3 14.3 13.2 13.3 13.4  LYMPHSABS 1.0  --   --  1,240  --   --   --   MONOABS 1.2*  --   --  868  --   --   --   EOSABS 0.0  --   --  0*  --   --   --   BASOSABS 0.0  --   --  0  --   --   --   < > = values in this interval not displayed.  Chemistries   Recent Labs Lab 03/23/17 2129  03/24/17 0225 03/26/17 0957 03/27/17 1327 03/28/17 0021 03/29/17 0404  NA 139  < > 141 143 137 137 140  K 4.5  < > 4.3 4.0 3.4* 3.2* 4.1  CL 106  < > 108 105 101 102 105  CO2 22  --  23 24 24 26 28   GLUCOSE 313*  < > 219* 211* 233* 153* 125*  BUN 19  < > 17 21 19 13 6   CREATININE 1.19*  < > 0.93 0.73 0.75 0.71 0.63  CALCIUM 9.3  --  9.4 9.5 8.8* 8.0* 8.7*  MG  --   --   --   --   --  1.9  --   AST 33  --   --  24 36 20 16  ALT 26  --   --  37* 60* 40 31  ALKPHOS 76  --   --  62 71 61 54  BILITOT 0.8  --   --  0.5 0.5 0.4 0.6  < > = values in this interval not displayed. ------------------------------------------------------------------------------------------------------------------ No results for input(s): CHOL, HDL, LDLCALC, TRIG, CHOLHDL, LDLDIRECT in the last 72 hours.  Lab Results  Component Value Date   HGBA1C 6.1 (H) 03/10/2017   ------------------------------------------------------------------------------------------------------------------ No results for input(s): TSH, T4TOTAL, T3FREE, THYROIDAB in the last 72 hours.  Invalid input(s): FREET3 ------------------------------------------------------------------------------------------------------------------ No  results for input(s): VITAMINB12, FOLATE, FERRITIN, TIBC, IRON, RETICCTPCT in the last 72 hours.  Coagulation profile No results for input(s): INR, PROTIME in the last 168 hours.  No results for input(s): DDIMER in the last 72 hours.  Cardiac Enzymes No results for input(s): CKMB, TROPONINI, MYOGLOBIN in the last 168 hours.  Invalid input(s): CK ------------------------------------------------------------------------------------------------------------------ No results found for: BNP  Inpatient Medications  Scheduled Meds: . enoxaparin (LOVENOX) injection  40 mg Subcutaneous Q24H  . insulin aspart  0-9 Units Subcutaneous Q6H  . levofloxacin  500 mg Oral Daily  . mouth rinse  15 mL Mouth Rinse BID  . metoprolol succinate  25 mg Oral Daily   Continuous Infusions: . 0.9 % NaCl with KCl 20 mEq / L 125 mL/hr at 03/29/17 0245   PRN Meds:.HYDROmorphone (DILAUDID) injection, labetalol, traMADol  Micro Results No results found for this or any previous visit (from the past 240 hour(s)).  Radiology Reports Ct Abdomen Pelvis Wo Contrast  Result Date: 03/27/2017 CLINICAL DATA:  Vomiting, fever, headache and hypertension. One day duration. EXAM: CT ABDOMEN AND PELVIS WITHOUT CONTRAST TECHNIQUE: Multidetector CT imaging of the abdomen and pelvis was performed following the standard protocol without IV contrast. COMPARISON:  None. FINDINGS: Lower chest: Normal except for minimal atelectasis/scar. Hepatobiliary: Normal Pancreas: Probable orally pancreatitis with mild edema and swelling in the region the pancreatic head. Spleen: Normal Adrenals/Urinary Tract: Adrenal glands are normal. Kidneys are normal. No cyst, mass, stone or hydronephrosis. Stomach/Bowel: Cannot rule out antral gastritis with wall thickening. This is not a definite finding. No other intestinal abnormality. Appendix is normal. Vascular/Lymphatic: Aortic atherosclerosis. No aneurysm. IVC is normal. No retroperitoneal adenopathy.  Reproductive: Normal Other: No free fluid or air. Musculoskeletal: Normal IMPRESSION: Probable early pancreatitis with mild pancreatic swelling and surrounding edema. Cannot rule out antral gastritis.  This is not a certain finding. Aortic atherosclerosis. Electronically Signed   By: Nelson Chimes M.D.   On: 03/27/2017 18:38   Dg Chest 2 View  Result Date: 03/23/2017 CLINICAL DATA:  Tachycardia. Nausea and vomiting. Headache and cough. EXAM: CHEST  2 VIEW COMPARISON:  None. FINDINGS: Artifact overlies chest. Heart size is normal. Mediastinal shadows are normal. The lungs are clear. No effusions. Minimal spinal curvature. No acute bone finding. IMPRESSION: No active cardiopulmonary disease. Electronically Signed   By: Nelson Chimes M.D.   On: 03/23/2017 20:37   Ct Head Wo Contrast  Result Date: 03/27/2017 CLINICAL DATA:  Headache. Hypertension. Symptoms of 1 day duration. EXAM: CT HEAD WITHOUT CONTRAST TECHNIQUE: Contiguous axial images were obtained from the base of the skull through the vertex without intravenous contrast. COMPARISON:  03/23/2017 FINDINGS: Brain: No evidence of malformation, atrophy, old or acute small or large vessel infarction, mass lesion, hemorrhage, hydrocephalus or extra-axial collection. No evidence of pituitary lesion. Vascular: There is atherosclerotic calcification of the major vessels at the base of the brain. Skull: Normal.  No fracture or focal bone lesion. Sinuses/Orbits: Opacification of the sphenoid sinus which could be a cause of headache. Mild mucosal thickening of the left maxillary sinus. Orbits negative. Other: None significant IMPRESSION: Normal appearance of the brain itself. Sphenoid sinusitis which could be a cause of headache. Electronically Signed   By: Nelson Chimes M.D.   On: 03/27/2017 18:39   Ct Head Wo Contrast  Result Date: 03/23/2017 CLINICAL DATA:  Intermittent severe headache.  Hypertension. EXAM: CT HEAD WITHOUT CONTRAST TECHNIQUE: Contiguous axial  images were obtained from the base of the skull through the vertex without intravenous contrast. COMPARISON:  None. FINDINGS: BRAIN: No intraparenchymal hemorrhage, mass effect nor midline shift. The ventricles and sulci are normal. No acute large vascular territory infarcts. No abnormal extra-axial fluid collections. Basal cisterns are patent. VASCULAR: Unremarkable. SKULL/SOFT TISSUES: No skull fracture. Osteopenia. No significant soft tissue swelling. ORBITS/SINUSES: The included ocular globes and orbital contents are normal.Moderate paranasal sinus mucosal thickening. Mastoid air cells are well  aerated. OTHER: None. IMPRESSION: Normal CT HEAD. Moderate paranasal sinusitis. Electronically Signed   By: Elon Alas M.D.   On: 03/23/2017 21:11   Ct Angio Chest/abd/pel For Dissection W And/or Wo Contrast  Result Date: 03/24/2017 CLINICAL DATA:  Acute onset of palpitations and severe headache. High blood pressure. Vomiting. Nausea and dizziness. Initial encounter. EXAM: CT ANGIOGRAPHY CHEST, ABDOMEN AND PELVIS TECHNIQUE: Multidetector CT imaging through the chest, abdomen and pelvis was performed using the standard protocol during bolus administration of intravenous contrast. Multiplanar reconstructed images and MIPs were obtained and reviewed to evaluate the vascular anatomy. CONTRAST:  100 mL of Isovue 370 IV contrast COMPARISON:  None. FINDINGS: CTA CHEST FINDINGS Cardiovascular: There is no evidence of aortic dissection. There is no evidence of aneurysmal dilatation. Minimal calcification is noted along the aortic arch. The great vessels are grossly unremarkable in appearance. There is no evidence of significant pulmonary embolus. The heart is normal in size. The thoracic aorta is otherwise unremarkable in appearance. Mediastinum/Nodes: The mediastinum is unremarkable in appearance. No mediastinal lymphadenopathy is seen. No pericardial effusion is identified. The visualized portions of the thyroid gland  are unremarkable. No axillary lymphadenopathy is seen. Lungs/Pleura: Hazy bilateral lower lobe airspace opacities, right greater than left, raise concern for multifocal pneumonia. There is trace opacification of bronchioles to both lower lung lobes, raising question for mild aspiration. No pleural effusion or pneumothorax is seen. No dominant mass is identified. Musculoskeletal: No acute osseous abnormalities are identified. The visualized musculature is unremarkable in appearance. Review of the MIP images confirms the above findings. CTA ABDOMEN AND PELVIS FINDINGS VASCULAR Aorta: There is no evidence of aortic dissection. There is no evidence of aneurysmal dilatation. Mild scattered calcification is seen along the abdominal aorta. Celiac: The celiac trunk is unremarkable in appearance. SMA: The superior mesenteric artery appears fully patent. Renals: The renal arteries appear intact bilaterally. IMA: The inferior mesenteric artery is unremarkable in appearance. Inflow: The common, internal and external iliac arteries appear fully patent bilaterally. The common femoral arteries and their proximal branches are unremarkable in appearance. Veins: The visualized venous structures are grossly unremarkable. The inferior vena cava is within normal limits. Review of the MIP images confirms the above findings. NON-VASCULAR Hepatobiliary: The liver is grossly unremarkable in appearance. Trace pericholecystic fluid is noted. The gallbladder is otherwise grossly unremarkable. The common bile duct is distended to 1.0 cm in diameter, raising concern for distal obstruction. Pancreas: The pancreas is within normal limits. Spleen: The spleen is unremarkable in appearance. Adrenals/Urinary Tract: A 2.8 cm right adrenal nodule is noted, not well characterized on this study. The left adrenal gland is unremarkable in appearance. A tiny left renal cyst is suggested. There is no evidence of hydronephrosis. No renal or ureteral stones are  identified. No perinephric stranding is seen. Stomach/Bowel: The stomach is unremarkable in appearance. The small bowel is within normal limits. The appendix is normal in caliber, without evidence of appendicitis. The colon is unremarkable in appearance. Lymphatic: No retroperitoneal or pelvic sidewall lymphadenopathy is seen. Reproductive: The bladder is moderately distended and grossly unremarkable. The uterus is unremarkable in appearance. The ovaries are relatively symmetric. No suspicious adnexal masses are seen. Other: No additional soft tissue abnormalities are seen. Musculoskeletal: No acute osseous abnormalities are identified. The visualized musculature is unremarkable in appearance. Review of the MIP images confirms the above findings. IMPRESSION: 1. No evidence of aortic dissection. No evidence of aneurysmal dilatation. 2. No evidence of significant pulmonary embolus. 3. Dilatation of the common  bile duct to 1.0 cm in diameter, with trace pericholecystic fluid. This raises concern for distal obstruction. Would correlate with LFTs. MRCP or ERCP could be considered for further evaluation, as deemed clinically appropriate. 4. Hazy bilateral lower lobe airspace opacities, right greater than left, raise concern for multifocal pneumonia. Trace opacification of bronchioles to both lower lung lobes. Given the patient's recent vomiting, this is concerning for mild aspiration. 5. **An incidental finding of potential clinical significance has been found. 2.8 cm right adrenal nodule noted. Would correlate with adrenal labs, and consider adrenal protocol MRI or CT for further evaluation, when and as deemed clinically appropriate.** 6. Mild aortic atherosclerosis. 7. Tiny left renal cyst suggested. Electronically Signed   By: Garald Balding M.D.   On: 03/24/2017 01:04   US Abdomen Limited Ruq  Result Date: 03/27/2017 CLINICAL DATA:  Pancreatitis EXAM: US ABDOMEN LIMITED - RIGHT UPPER QUADRANT COMPARISON:  CT from  03/27/2017 FINDINGS: Gallbladder: No gallstones or wall thickening visualized. No sonographic Murphy sign noted by sonographer. Common bile duct: Diameter: 3 mm at the level of hepatic artery. No choledocholithiasis. Liver: No focal lesion identified. Within normal limits in parenchymal echogenicity. IMPRESSION: No acute sonographic abnormality in the right upper quadrant of the abdomen. Electronically Signed   By: Ashley Royalty M.D.   On: 03/27/2017 23:16    Time Spent in minutes  30   Jani Gravel M.D on 03/29/2017 at 7:31 AM  Between 7am to 7pm - Pager - [phone]  After 7pm go to [email or web address] - password [facility]  [facility] -  Office  [phone]

## 2017-03-30 DIAGNOSIS — K8500 Idiopathic acute pancreatitis without necrosis or infection: Secondary | ICD-10-CM

## 2017-03-30 LAB — COMPREHENSIVE METABOLIC PANEL
ALK PHOS: 51 U/L (ref 38–126)
ALT: 25 U/L (ref 14–54)
ANION GAP: 6 (ref 5–15)
AST: 14 U/L — ABNORMAL LOW (ref 15–41)
Albumin: 2.8 g/dL — ABNORMAL LOW (ref 3.5–5.0)
BUN: 7 mg/dL (ref 6–20)
CALCIUM: 8.6 mg/dL — AB (ref 8.9–10.3)
CO2: 29 mmol/L (ref 22–32)
CREATININE: 0.63 mg/dL (ref 0.44–1.00)
Chloride: 104 mmol/L (ref 101–111)
GFR calc non Af Amer: >60 mL/min (ref >60)
Glucose, Bld: 107 mg/dL — ABNORMAL HIGH (ref 65–99)
Potassium: 3.2 mmol/L — ABNORMAL LOW (ref 3.5–5.1)
SODIUM: 139 mmol/L (ref 135–145)
TOTAL PROTEIN: 5 g/dL — AB (ref 6.5–8.1)
Total Bilirubin: 0.4 mg/dL (ref 0.3–1.2)

## 2017-03-30 LAB — LIPID PANEL
Cholesterol: 197 mg/dL (ref 0–200)
HDL: 48 mg/dL (ref >40)
LDL CALC: 103 mg/dL — AB (ref 0–99)
Total CHOL/HDL Ratio: 4.1 RATIO
Triglycerides: 232 mg/dL — ABNORMAL HIGH (ref <150)
VLDL: 46 mg/dL — AB (ref 0–40)

## 2017-03-30 LAB — CBC
HCT: 36.8 % (ref 36.0–46.0)
HEMOGLOBIN: 12 g/dL (ref 12.0–15.0)
MCH: 26.1 pg (ref 26.0–34.0)
MCHC: 32.6 g/dL (ref 30.0–36.0)
MCV: 80 fL (ref 78.0–100.0)
PLATELETS: 262 10*3/uL (ref 150–400)
RBC: 4.6 MIL/uL (ref 3.87–5.11)
RDW: 13.3 % (ref 11.5–15.5)
WBC: 7.9 10*3/uL (ref 4.0–10.5)

## 2017-03-30 LAB — GLUCOSE, CAPILLARY
GLUCOSE-CAPILLARY: 140 mg/dL — AB (ref 65–99)
GLUCOSE-CAPILLARY: 201 mg/dL — AB (ref 65–99)
Glucose-Capillary: 101 mg/dL — ABNORMAL HIGH (ref 65–99)

## 2017-03-30 MED ORDER — TRAMADOL HCL 50 MG PO TABS: 50.0000 mg | ORAL_TABLET | Freq: Four times a day (QID) | ORAL | 0 refills | Status: DC | PRN

## 2017-03-30 NOTE — Discharge Instructions (Signed)
Acute Pancreatitis ° °Acute pancreatitis is a condition in which the pancreas suddenly becomes irritated and swollen (has inflammation). The pancreas is a gland that is located behind the stomach. It produces enzymes that help to digest food. The pancreas also releases the hormones glucagon and insulin, which help to regulate blood sugar. Damage to the pancreas occurs when the digestive enzymes from the pancreas are activated before they are released into the intestine. °Most acute attacks last a couple of days and can cause serious problems. Some people become dehydrated and develop low blood pressure. In severe cases, bleeding into the pancreas can lead to shock and can be life-threatening. The lungs, heart, and kidneys may fail. °What are the causes? °The most common causes of this condition are: °· Alcohol abuse. °· Gallstones. °Other causes include: °· Certain medicines. °· Exposure to certain chemicals. °· Infection. °· Damage caused by an accident (trauma). °· Abdominal surgery. °In some cases, the cause may not be known. °What are the signs or symptoms? °Symptoms of this condition include: °· Pain in the upper abdomen that may radiate to the back. °· Tenderness and swelling of the abdomen. °· Nausea and vomiting. °How is this diagnosed? °This condition may be diagnosed based on: °· A physical exam. °· Blood tests. °· Imaging tests, such as X-rays, CT scans, or an ultrasound of the abdomen. °How is this treated? °Treatment for this condition usually requires a stay in the hospital. Treatment may include: °· Pain medicine. °· Fluid replacement through an IV tube. °· Placing a tube in the stomach to remove stomach contents and to control vomiting (NG tube, or nasogastric tube). °· Not eating for 3-4 days. This gives the pancreas a rest, because enzymes are not being produced that can cause further damage. °· Antibiotic medicines, if your condition is caused by an infection. °· Surgery on the pancreas or  gallbladder. °Follow these instructions at home: °Eating and drinking  °· Follow instructions from your health care provider about diet. This may involve avoiding alcohol and decreasing the amount of fat in your diet. °· Eat smaller, more frequent meals. This reduces the amount of digestive fluids that the pancreas produces. °· Drink enough fluid to keep your urine clear or pale yellow. °· Do not drink alcohol if it caused your condition. °General instructions  °· Take over-the-counter and prescription medicines only as told by your health care provider. °· Do not use any tobacco products, such as cigarettes, chewing tobacco, and e-cigarettes. If you need help quitting, ask your health care provider. °· Get plenty of rest. °· If directed, check your blood sugar at home as told by your health care provider. °· Keep all follow-up visits as told by your health care provider. This is important. °Contact a health care provider if: °· You do not recover as quickly as expected. °· You develop new or worsening symptoms. °· You have persistent pain, weakness, or nausea. °· You recover and then have another episode of pain. °· You have a fever. °Get help right away if: °· You cannot eat or keep fluids down. °· Your pain becomes severe. °· Your skin or the white part of your eyes turns yellow (jaundice). °· You vomit. °· You feel dizzy or you faint. °· Your blood sugar is high (over 300 mg/dL). °This information is not intended to replace advice given to you by your health care provider. Make sure you discuss any questions you have with your health care provider. °Document Released: 11/17/2005 Document   Revised: 03/26/2016 Document Reviewed: 08/21/2015 °[employer] Interactive Patient Education © 2017 [employer] ° °

## 2017-03-30 NOTE — Discharge Summary (Signed)
Physician Discharge Summary  Jill [name] YYT:[id number] DOB: July 06, 1956 DOA: 03/27/2017  PCP: Odette Fraction, MD  Admit date: 03/27/2017 Discharge date: 03/30/2017  Admitted From: Home Disposition:  Home  Recommendations for Outpatient Follow-up:  1. Follow up with PCP in 1-2 weeks 2. Follow up with Cardiologist as scheduled 3. Patient might need GI evaluation if persistence of symptoms  Home Health: No Equipment/Devices: None  Discharge Condition: Stable CODE STATUS: Full Diet recommendation: Heart Healthy   Brief/Interim Summary: 61 y.o.femalewith a past medical history significant for pre-diabeteswho presented with 2 days vomiting and abdominal pain. She was found to have Acute pancreatitis on CT abdomen. Her symptoms have improved. She is tolerating diet. Doxycycline was discontinued.   Discharge Diagnoses:  Principal Problem:   Pancreatitis Active Problems:   Hypertensive urgency   Hyperglycemia   Atrial tachycardia, paroxysmal (HCC)   Abdominal pain  1. Acute pancreatitis:(no family hx) CT scan abd/ pevlis 4/27=>probable early pancreatitis with  mild pancreatic swelling and surround edema RUQ [place] 4/27 =>negative for cholelithiasis ? Medication induced (doxycycline, prednisone). Pt denies etoh use.  Symptoms resolved. Discharge today; might need outpatient GI evaluation if symptoms retunr  2. Abnormal liver function:  resolved   3. Probably prediabetes: outpatient followup  4. Prolonged QT interval versus U waves: TSH is normal, K mildly low on admission -repleted K, -Mag wnl -Avoid QT prolonging meds for now  5. Adrenal nodule: on CT abd/pelvis 4/24 Outpatient follow-up  6. Hypertensive urgency:improved -Continue oral metoprolol for atrial arrhythmia  7.  Headache: CT scan showed sphenoid sinusitis. Avoid any more antibacterials for now.  8. Recent diagnosis of Paroxysmal Atrial tachycardia: continue Metoprolol; outpatient  Follow up  with Cardiology.   Discharge Instructions  Discharge Instructions    Call MD for:  difficulty breathing, headache or visual disturbances    Complete by:  As directed    Call MD for:  persistant nausea and vomiting    Complete by:  As directed    Call MD for:  severe uncontrolled pain    Complete by:  As directed    Call MD for:  temperature >100.4    Complete by:  As directed    Diet - low sodium heart healthy    Complete by:  As directed    Increase activity slowly    Complete by:  As directed      Allergies as of 03/30/2017      Reactions   Codeine Nausea Only   Dizziness   Contrast Media [iodinated Diagnostic Agents] Hives   Tessalon [benzonatate] Other (See Comments)   Per pt this mixed with Tessalon made her throat close up   Valium [diazepam] Nausea Only   Dizziness   Zyrtec [cetirizine] Other (See Comments)   Per pt this mixed with Tessalon made her throat close up   Penicillins Rash      Medication List    TAKE these medications   cholecalciferol 1000 units tablet Commonly known as:  VITAMIN D Take 1,000 Units by mouth daily.   metoprolol succinate 25 MG 24 hr tablet Commonly known as:  TOPROL-XL Take 1 tablet (25 mg total) by mouth daily.   multivitamin with minerals Tabs tablet Take 1 tablet by mouth daily.   traMADol 50 MG tablet Commonly known as:  ULTRAM Take 1 tablet (50 mg total) by mouth every 6 (six) hours as needed for moderate pain.      Follow-up Information    PICKARD,WARREN TOM, MD Follow up in 1  week(s).   Specialty:  Family Medicine Contact information: [address] [place] [address] [phone]          Allergies  Allergen Reactions  . Codeine Nausea Only    Dizziness  . Contrast Media [Iodinated Diagnostic Agents] Hives  . Tessalon [Benzonatate] Other (See Comments)    Per pt this mixed with Tessalon made her throat close up  . Valium [Diazepam] Nausea Only    Dizziness  . Zyrtec [Cetirizine] Other  (See Comments)    Per pt this mixed with Tessalon made her throat close up  . Penicillins Rash    Consultations:  None   Procedures/Studies: Ct Abdomen Pelvis Wo Contrast  Result Date: 03/27/2017 CLINICAL DATA:  Vomiting, fever, headache and hypertension. One day duration. EXAM: CT ABDOMEN AND PELVIS WITHOUT CONTRAST TECHNIQUE: Multidetector CT imaging of the abdomen and pelvis was performed following the standard protocol without IV contrast. COMPARISON:  None. FINDINGS: Lower chest: Normal except for minimal atelectasis/scar. Hepatobiliary: Normal Pancreas: Probable orally pancreatitis with mild edema and swelling in the region the pancreatic head. Spleen: Normal Adrenals/Urinary Tract: Adrenal glands are normal. Kidneys are normal. No cyst, mass, stone or hydronephrosis. Stomach/Bowel: Cannot rule out antral gastritis with wall thickening. This is not a definite finding. No other intestinal abnormality. Appendix is normal. Vascular/Lymphatic: Aortic atherosclerosis. No aneurysm. IVC is normal. No retroperitoneal adenopathy. Reproductive: Normal Other: No free fluid or air. Musculoskeletal: Normal IMPRESSION: Probable early pancreatitis with mild pancreatic swelling and surrounding edema. Cannot rule out antral gastritis.  This is not a certain finding. Aortic atherosclerosis. Electronically Signed   By: Nelson Chimes M.D.   On: 03/27/2017 18:38   Dg Chest 2 View  Result Date: 03/23/2017 CLINICAL DATA:  Tachycardia. Nausea and vomiting. Headache and cough. EXAM: CHEST  2 VIEW COMPARISON:  None. FINDINGS: Artifact overlies chest. Heart size is normal. Mediastinal shadows are normal. The lungs are clear. No effusions. Minimal spinal curvature. No acute bone finding. IMPRESSION: No active cardiopulmonary disease. Electronically Signed   By: Nelson Chimes M.D.   On: 03/23/2017 20:37   Ct Head Wo Contrast  Result Date: 03/27/2017 CLINICAL DATA:  Headache. Hypertension. Symptoms of 1 day duration.  EXAM: CT HEAD WITHOUT CONTRAST TECHNIQUE: Contiguous axial images were obtained from the base of the skull through the vertex without intravenous contrast. COMPARISON:  03/23/2017 FINDINGS: Brain: No evidence of malformation, atrophy, old or acute small or large vessel infarction, mass lesion, hemorrhage, hydrocephalus or extra-axial collection. No evidence of pituitary lesion. Vascular: There is atherosclerotic calcification of the major vessels at the base of the brain. Skull: Normal.  No fracture or focal bone lesion. Sinuses/Orbits: Opacification of the sphenoid sinus which could be a cause of headache. Mild mucosal thickening of the left maxillary sinus. Orbits negative. Other: None significant IMPRESSION: Normal appearance of the brain itself. Sphenoid sinusitis which could be a cause of headache. Electronically Signed   By: Nelson Chimes M.D.   On: 03/27/2017 18:39   Ct Head Wo Contrast  Result Date: 03/23/2017 CLINICAL DATA:  Intermittent severe headache.  Hypertension. EXAM: CT HEAD WITHOUT CONTRAST TECHNIQUE: Contiguous axial images were obtained from the base of the skull through the vertex without intravenous contrast. COMPARISON:  None. FINDINGS: BRAIN: No intraparenchymal hemorrhage, mass effect nor midline shift. The ventricles and sulci are normal. No acute large vascular territory infarcts. No abnormal extra-axial fluid collections. Basal cisterns are patent. VASCULAR: Unremarkable. SKULL/SOFT TISSUES: No skull fracture. Osteopenia. No significant soft tissue  swelling. ORBITS/SINUSES: The included ocular globes and orbital contents are normal.Moderate paranasal sinus mucosal thickening. Mastoid air cells are well aerated. OTHER: None. IMPRESSION: Normal CT HEAD. Moderate paranasal sinusitis. Electronically Signed   By: Elon Alas M.D.   On: 03/23/2017 21:11   Ct Angio Chest/abd/pel For Dissection W And/or Wo Contrast  Result Date: 03/24/2017 CLINICAL DATA:  Acute onset of palpitations  and severe headache. High blood pressure. Vomiting. Nausea and dizziness. Initial encounter. EXAM: CT ANGIOGRAPHY CHEST, ABDOMEN AND PELVIS TECHNIQUE: Multidetector CT imaging through the chest, abdomen and pelvis was performed using the standard protocol during bolus administration of intravenous contrast. Multiplanar reconstructed images and MIPs were obtained and reviewed to evaluate the vascular anatomy. CONTRAST:  100 mL of Isovue 370 IV contrast COMPARISON:  None. FINDINGS: CTA CHEST FINDINGS Cardiovascular: There is no evidence of aortic dissection. There is no evidence of aneurysmal dilatation. Minimal calcification is noted along the aortic arch. The great vessels are grossly unremarkable in appearance. There is no evidence of significant pulmonary embolus. The heart is normal in size. The thoracic aorta is otherwise unremarkable in appearance. Mediastinum/Nodes: The mediastinum is unremarkable in appearance. No mediastinal lymphadenopathy is seen. No pericardial effusion is identified. The visualized portions of the thyroid gland are unremarkable. No axillary lymphadenopathy is seen. Lungs/Pleura: Hazy bilateral lower lobe airspace opacities, right greater than left, raise concern for multifocal pneumonia. There is trace opacification of bronchioles to both lower lung lobes, raising question for mild aspiration. No pleural effusion or pneumothorax is seen. No dominant mass is identified. Musculoskeletal: No acute osseous abnormalities are identified. The visualized musculature is unremarkable in appearance. Review of the MIP images confirms the above findings. CTA ABDOMEN AND PELVIS FINDINGS VASCULAR Aorta: There is no evidence of aortic dissection. There is no evidence of aneurysmal dilatation. Mild scattered calcification is seen along the abdominal aorta. Celiac: The celiac trunk is unremarkable in appearance. SMA: The superior mesenteric artery appears fully patent. Renals: The renal arteries appear  intact bilaterally. IMA: The inferior mesenteric artery is unremarkable in appearance. Inflow: The common, internal and external iliac arteries appear fully patent bilaterally. The common femoral arteries and their proximal branches are unremarkable in appearance. Veins: The visualized venous structures are grossly unremarkable. The inferior vena cava is within normal limits. Review of the MIP images confirms the above findings. NON-VASCULAR Hepatobiliary: The liver is grossly unremarkable in appearance. Trace pericholecystic fluid is noted. The gallbladder is otherwise grossly unremarkable. The common bile duct is distended to 1.0 cm in diameter, raising concern for distal obstruction. Pancreas: The pancreas is within normal limits. Spleen: The spleen is unremarkable in appearance. Adrenals/Urinary Tract: A 2.8 cm right adrenal nodule is noted, not well characterized on this study. The left adrenal gland is unremarkable in appearance. A tiny left renal cyst is suggested. There is no evidence of hydronephrosis. No renal or ureteral stones are identified. No perinephric stranding is seen. Stomach/Bowel: The stomach is unremarkable in appearance. The small bowel is within normal limits. The appendix is normal in caliber, without evidence of appendicitis. The colon is unremarkable in appearance. Lymphatic: No retroperitoneal or pelvic sidewall lymphadenopathy is seen. Reproductive: The bladder is moderately distended and grossly unremarkable. The uterus is unremarkable in appearance. The ovaries are relatively symmetric. No suspicious adnexal masses are seen. Other: No additional soft tissue abnormalities are seen. Musculoskeletal: No acute osseous abnormalities are identified. The visualized musculature is unremarkable in appearance. Review of the MIP images confirms the above findings. IMPRESSION: 1. No evidence  of aortic dissection. No evidence of aneurysmal dilatation. 2. No evidence of significant pulmonary  embolus. 3. Dilatation of the common bile duct to 1.0 cm in diameter, with trace pericholecystic fluid. This raises concern for distal obstruction. Would correlate with LFTs. MRCP or ERCP could be considered for further evaluation, as deemed clinically appropriate. 4. Hazy bilateral lower lobe airspace opacities, right greater than left, raise concern for multifocal pneumonia. Trace opacification of bronchioles to both lower lung lobes. Given the patient's recent vomiting, this is concerning for mild aspiration. 5. **An incidental finding of potential clinical significance has been found. 2.8 cm right adrenal nodule noted. Would correlate with adrenal labs, and consider adrenal protocol MRI or CT for further evaluation, when and as deemed clinically appropriate.** 6. Mild aortic atherosclerosis. 7. Tiny left renal cyst suggested. Electronically Signed   By: Garald Balding M.D.   On: 03/24/2017 01:04   US Abdomen Limited Ruq  Result Date: 03/27/2017 CLINICAL DATA:  Pancreatitis EXAM: US ABDOMEN LIMITED - RIGHT UPPER QUADRANT COMPARISON:  CT from 03/27/2017 FINDINGS: Gallbladder: No gallstones or wall thickening visualized. No sonographic Murphy sign noted by sonographer. Common bile duct: Diameter: 3 mm at the level of hepatic artery. No choledocholithiasis. Liver: No focal lesion identified. Within normal limits in parenchymal echogenicity. IMPRESSION: No acute sonographic abnormality in the right upper quadrant of the abdomen. Electronically Signed   By: Ashley Royalty M.D.   On: 03/27/2017 23:16       Subjective: Patient seen and examined at bedside. She denies any nausea, vomiting or fever.  Discharge Exam: Vitals:   03/29/17 1300 03/29/17 2029  BP: (!) 128/50 (!) 153/72  Pulse: 70 (!) 57  Resp: 18 18  Temp: 98 F (36.7 C) 98.6 F (37 C)   Vitals:   03/29/17 0227 03/29/17 0431 03/29/17 1300 03/29/17 2029  BP: (!) 167/81 (!) 158/75 (!) 128/50 (!) 153/72  Pulse:  (!) 55 70 (!) 57  Resp:  18  18 18   Temp:  98.2 F (36.8 C) 98 F (36.7 C) 98.6 F (37 C)  TempSrc:  Oral Oral Oral  SpO2:  100% 100% 98%  Weight:      Height:        General: Pt is alert, awake, not in acute distress Cardiovascular: RRR, S1/S2 +, no rubs, no gallops Respiratory: CTA bilaterally, no wheezing, no rhonchi Abdominal: Soft, NT, ND, bowel sounds + Extremities: no edema, no cyanosis    The results of significant diagnostics from this hospitalization (including imaging, microbiology, ancillary and laboratory) are listed below for reference.     Microbiology: No results found for this or any previous visit (from the past 240 hour(s)).   Labs: BNP (last 3 results) No results for input(s): BNP in the last 8760 hours. Basic Metabolic Panel:  Recent Labs Lab 03/26/17 0957 03/27/17 1327 03/28/17 0021 03/29/17 0404 03/30/17 0428  NA 143 137 137 140 139  K 4.0 3.4* 3.2* 4.1 3.2*  CL 105 101 102 105 104  CO2 24 24 26 28 29   GLUCOSE 211* 233* 153* 125* 107*  BUN 21 19 13 6 7   CREATININE 0.73 0.75 0.71 0.63 0.63  CALCIUM 9.5 8.8* 8.0* 8.7* 8.6*  MG  --   --  1.9  --   --    Liver Function Tests:  Recent Labs Lab 03/26/17 0957 03/27/17 1327 03/28/17 0021 03/29/17 0404 03/30/17 0428  AST 24 36 20 16 14*  ALT 37* 60* 40 31 25  ALKPHOS 62  71 61 54 51  BILITOT 0.5 0.5 0.4 0.6 0.4  PROT 5.8* 5.9* 5.0* 5.1* 5.0*  ALBUMIN 3.8 3.6 2.8* 2.6* 2.8*    Recent Labs Lab 03/23/17 2129 03/27/17 1327 03/29/17 0404  LIPASE 22 113* 24   No results for input(s): AMMONIA in the last 168 hours. CBC:  Recent Labs Lab 03/23/17 2129  03/26/17 0957 03/27/17 1327 03/28/17 0021 03/29/17 0404 03/30/17 0428  WBC 20.5*  < > 12.4* 16.4* 11.4* 8.6 7.9  NEUTROABS 18.3*  --  10,292*  --   --   --   --   HGB 13.7  < > 11.9* 13.1 12.7 12.5 12.0  HCT 41.1  < > 36.3 40.5 37.8 38.4 36.8  MCV 80.6  < > 81.4 80.7 79.2 80.2 80.0  PLT 375  < > 369 390 281 267 262  < > = values in this interval not  displayed. Cardiac Enzymes: No results for input(s): CKTOTAL, CKMB, CKMBINDEX, TROPONINI in the last 168 hours. BNP: Invalid input(s): POCBNP CBG:  Recent Labs Lab 03/29/17 1212 03/29/17 1723 03/29/17 2320 03/30/17 0604 03/30/17 1201  GLUCAP 122* 137* 120* 101* 201*   D-Dimer No results for input(s): DDIMER in the last 72 hours. Hgb A1c No results for input(s): HGBA1C in the last 72 hours. Lipid Profile  Recent Labs  03/30/17 0428  CHOL 197  HDL 48  LDLCALC 103*  TRIG 232*  CHOLHDL 4.1   Thyroid function studies No results for input(s): TSH, T4TOTAL, T3FREE, THYROIDAB in the last 72 hours.  Invalid input(s): FREET3 Anemia work up No results for input(s): VITAMINB12, FOLATE, FERRITIN, TIBC, IRON, RETICCTPCT in the last 72 hours. Urinalysis    Component Value Date/Time   COLORURINE YELLOW 03/27/2017 1635   APPEARANCEUR CLEAR 03/27/2017 1635   LABSPEC 1.021 03/27/2017 1635   PHURINE 5.0 03/27/2017 1635   GLUCOSEU >=500 (A) 03/27/2017 1635   HGBUR SMALL (A) 03/27/2017 1635   BILIRUBINUR NEGATIVE 03/27/2017 1635   KETONESUR NEGATIVE 03/27/2017 1635   PROTEINUR NEGATIVE 03/27/2017 1635   NITRITE NEGATIVE 03/27/2017 1635   LEUKOCYTESUR MODERATE (A) 03/27/2017 1635   Sepsis Labs Invalid input(s): PROCALCITONIN,  WBC,  LACTICIDVEN Microbiology No results found for this or any previous visit (from the past 240 hour(s)).   Time coordinating discharge:  35 minutes  SIGNED:   Aline August, MD  [facility] 03/30/2017, 12:39 PM Pager [phone]  If 7PM-7AM, please contact night-coverage [email or web address] Password [email or web address]

## 2017-03-30 NOTE — Progress Notes (Signed)
Discharge home. Home discharge instruction given, no question verbalized. 

## 2017-03-31 ENCOUNTER — Ambulatory Visit: Payer: BC Managed Care – PPO | Admitting: Family Medicine

## 2017-03-31 ENCOUNTER — Encounter (HOSPITAL_COMMUNITY): Payer: Self-pay | Admitting: Emergency Medicine

## 2017-03-31 ENCOUNTER — Emergency Department (HOSPITAL_COMMUNITY): Payer: BC Managed Care – PPO

## 2017-03-31 ENCOUNTER — Telehealth: Payer: Self-pay | Admitting: Family Medicine

## 2017-03-31 ENCOUNTER — Observation Stay (HOSPITAL_COMMUNITY)
Admission: EM | Admit: 2017-03-31 | Discharge: 2017-04-02 | Disposition: A | Discharge status: Home / Self Care | Payer: BC Managed Care – PPO | Attending: Internal Medicine | Admitting: Internal Medicine

## 2017-03-31 DIAGNOSIS — R079 Chest pain, unspecified: Secondary | ICD-10-CM | POA: Diagnosis present

## 2017-03-31 DIAGNOSIS — I248 Other forms of acute ischemic heart disease: Secondary | ICD-10-CM | POA: Insufficient documentation

## 2017-03-31 DIAGNOSIS — Z79899 Other long term (current) drug therapy: Secondary | ICD-10-CM | POA: Insufficient documentation

## 2017-03-31 DIAGNOSIS — E279 Disorder of adrenal gland, unspecified: Secondary | ICD-10-CM | POA: Insufficient documentation

## 2017-03-31 DIAGNOSIS — I471 Supraventricular tachycardia: Secondary | ICD-10-CM | POA: Diagnosis not present

## 2017-03-31 DIAGNOSIS — D72829 Elevated white blood cell count, unspecified: Secondary | ICD-10-CM | POA: Diagnosis not present

## 2017-03-31 DIAGNOSIS — R7303 Prediabetes: Secondary | ICD-10-CM | POA: Insufficient documentation

## 2017-03-31 DIAGNOSIS — R748 Abnormal levels of other serum enzymes: Secondary | ICD-10-CM | POA: Insufficient documentation

## 2017-03-31 DIAGNOSIS — R109 Unspecified abdominal pain: Secondary | ICD-10-CM | POA: Diagnosis not present

## 2017-03-31 DIAGNOSIS — K838 Other specified diseases of biliary tract: Secondary | ICD-10-CM | POA: Diagnosis not present

## 2017-03-31 DIAGNOSIS — R200 Anesthesia of skin: Secondary | ICD-10-CM | POA: Diagnosis not present

## 2017-03-31 DIAGNOSIS — E785 Hyperlipidemia, unspecified: Secondary | ICD-10-CM | POA: Diagnosis not present

## 2017-03-31 DIAGNOSIS — I10 Essential (primary) hypertension: Secondary | ICD-10-CM | POA: Diagnosis not present

## 2017-03-31 DIAGNOSIS — R519 Headache, unspecified: Secondary | ICD-10-CM | POA: Diagnosis present

## 2017-03-31 DIAGNOSIS — I5189 Other ill-defined heart diseases: Secondary | ICD-10-CM | POA: Diagnosis not present

## 2017-03-31 DIAGNOSIS — I160 Hypertensive urgency: Secondary | ICD-10-CM | POA: Diagnosis not present

## 2017-03-31 DIAGNOSIS — K862 Cyst of pancreas: Secondary | ICD-10-CM | POA: Insufficient documentation

## 2017-03-31 DIAGNOSIS — E278 Other specified disorders of adrenal gland: Secondary | ICD-10-CM | POA: Diagnosis present

## 2017-03-31 DIAGNOSIS — R51 Headache: Secondary | ICD-10-CM

## 2017-03-31 HISTORY — DX: Supraventricular tachycardia: I47.1

## 2017-03-31 HISTORY — DX: Other supraventricular tachycardia: I47.19

## 2017-03-31 LAB — COMPREHENSIVE METABOLIC PANEL
ALK PHOS: 68 U/L (ref 38–126)
ALT: 52 U/L (ref 14–54)
AST: 55 U/L — AB (ref 15–41)
Albumin: 3.4 g/dL — ABNORMAL LOW (ref 3.5–5.0)
Anion gap: 12 (ref 5–15)
BILIRUBIN TOTAL: 0.7 mg/dL (ref 0.3–1.2)
BUN: 20 mg/dL (ref 6–20)
CALCIUM: 8.9 mg/dL (ref 8.9–10.3)
CHLORIDE: 99 mmol/L — AB (ref 101–111)
CO2: 25 mmol/L (ref 22–32)
CREATININE: 0.89 mg/dL (ref 0.44–1.00)
GFR calc Af Amer: >60 mL/min (ref >60)
Glucose, Bld: 292 mg/dL — ABNORMAL HIGH (ref 65–99)
Potassium: 3.5 mmol/L (ref 3.5–5.1)
Sodium: 136 mmol/L (ref 135–145)
Total Protein: 6.3 g/dL — ABNORMAL LOW (ref 6.5–8.1)

## 2017-03-31 LAB — HEPATITIS PANEL, ACUTE
HCV Ab: <0.1 s/co ratio (ref 0.0–0.9)
HEP B C IGM: NEGATIVE
Hep A IgM: NEGATIVE
Hepatitis B Surface Ag: NEGATIVE

## 2017-03-31 LAB — URINALYSIS, ROUTINE W REFLEX MICROSCOPIC
BILIRUBIN URINE: NEGATIVE
GLUCOSE, UA: 150 mg/dL — AB
HGB URINE DIPSTICK: NEGATIVE
Ketones, ur: NEGATIVE mg/dL
Leukocytes, UA: NEGATIVE
Nitrite: NEGATIVE
PROTEIN: NEGATIVE mg/dL
Specific Gravity, Urine: 1.018 (ref 1.005–1.030)
pH: 5 (ref 5.0–8.0)

## 2017-03-31 LAB — MAGNESIUM: MAGNESIUM: 2.3 mg/dL (ref 1.7–2.4)

## 2017-03-31 LAB — RAPID URINE DRUG SCREEN, HOSP PERFORMED
AMPHETAMINES: NOT DETECTED
BARBITURATES: NOT DETECTED
BENZODIAZEPINES: NOT DETECTED
Cocaine: NOT DETECTED
Opiates: NOT DETECTED
Tetrahydrocannabinol: NOT DETECTED

## 2017-03-31 LAB — CBC WITH DIFFERENTIAL/PLATELET
BASOS ABS: 0.1 10*3/uL (ref 0.0–0.1)
Basophils Relative: 0 %
Eosinophils Absolute: 0 10*3/uL (ref 0.0–0.7)
Eosinophils Relative: 0 %
HEMATOCRIT: 42.9 % (ref 36.0–46.0)
HEMOGLOBIN: 14.5 g/dL (ref 12.0–15.0)
LYMPHS ABS: 1.7 10*3/uL (ref 0.7–4.0)
LYMPHS PCT: 10 %
MCH: 26.8 pg (ref 26.0–34.0)
MCHC: 33.8 g/dL (ref 30.0–36.0)
MCV: 79.2 fL (ref 78.0–100.0)
Monocytes Absolute: 1.2 10*3/uL — ABNORMAL HIGH (ref 0.1–1.0)
Monocytes Relative: 8 %
NEUTROS ABS: 12.9 10*3/uL — AB (ref 1.7–7.7)
Neutrophils Relative %: 82 %
Platelets: 393 10*3/uL (ref 150–400)
RBC: 5.42 MIL/uL — AB (ref 3.87–5.11)
RDW: 13 % (ref 11.5–15.5)
WBC: 15.9 10*3/uL — AB (ref 4.0–10.5)

## 2017-03-31 LAB — I-STAT TROPONIN, ED: Troponin i, poc: 0.03 ng/mL (ref 0.00–0.08)

## 2017-03-31 LAB — TROPONIN I: TROPONIN I: 0.09 ng/mL — AB (ref <0.03)

## 2017-03-31 LAB — GLUCOSE, CAPILLARY: Glucose-Capillary: 194 mg/dL — ABNORMAL HIGH (ref 65–99)

## 2017-03-31 LAB — LIPASE, BLOOD: Lipase: 37 U/L (ref 11–51)

## 2017-03-31 MED ORDER — ACETAMINOPHEN 325 MG PO TABS: 650.0000 mg | ORAL_TABLET | Freq: Four times a day (QID) | ORAL | Status: DC | PRN

## 2017-03-31 MED ORDER — MORPHINE SULFATE (PF) 4 MG/ML IV SOLN: 2.0000 mg | INTRAVENOUS | Status: DC | PRN

## 2017-03-31 MED ORDER — ZOLPIDEM TARTRATE 5 MG PO TABS: 5.0000 mg | ORAL_TABLET | Freq: Every evening | ORAL | Status: DC | PRN

## 2017-03-31 MED ORDER — FENTANYL CITRATE (PF) 100 MCG/2ML IJ SOLN
50.0000 ug | Freq: Once | INTRAMUSCULAR | Status: AC
  Administered 2017-03-31: 50 ug via INTRAVENOUS
  Filled 2017-03-31: qty 2

## 2017-03-31 MED ORDER — TRAMADOL HCL 50 MG PO TABS
50.0000 mg | ORAL_TABLET | Freq: Four times a day (QID) | ORAL | Status: DC | PRN
  Filled 2017-03-31: qty 1

## 2017-03-31 MED ORDER — AMLODIPINE BESYLATE 10 MG PO TABS
10.0000 mg | ORAL_TABLET | Freq: Every day | ORAL | Status: DC
  Administered 2017-03-31: 10 mg via ORAL
  Filled 2017-03-31: qty 1

## 2017-03-31 MED ORDER — DIPHENHYDRAMINE HCL 50 MG/ML IJ SOLN
25.0000 mg | Freq: Once | INTRAMUSCULAR | Status: AC
  Administered 2017-03-31: 25 mg via INTRAVENOUS
  Filled 2017-03-31: qty 1

## 2017-03-31 MED ORDER — ONDANSETRON HCL 4 MG/2ML IJ SOLN: 4.0000 mg | Freq: Three times a day (TID) | INTRAMUSCULAR | Status: DC | PRN

## 2017-03-31 MED ORDER — NITROGLYCERIN 0.4 MG SL SUBL: 0.4000 mg | SUBLINGUAL_TABLET | SUBLINGUAL | Status: DC | PRN

## 2017-03-31 MED ORDER — INSULIN ASPART 100 UNIT/ML ~~LOC~~ SOLN: 0.0000 [IU] | Freq: Every day | SUBCUTANEOUS | Status: DC

## 2017-03-31 MED ORDER — BARIUM SULFATE 2.1 % PO SUSP
ORAL | Status: AC
  Filled 2017-03-31: qty 1

## 2017-03-31 MED ORDER — ENOXAPARIN SODIUM 40 MG/0.4ML ~~LOC~~ SOLN
40.0000 mg | SUBCUTANEOUS | Status: DC
  Administered 2017-03-31 – 2017-04-01 (×2): 40 mg via SUBCUTANEOUS
  Filled 2017-03-31 (×3): qty 0.4

## 2017-03-31 MED ORDER — VITAMIN D 1000 UNITS PO TABS
1000.0000 [IU] | ORAL_TABLET | Freq: Every day | ORAL | Status: DC
  Filled 2017-03-31 (×2): qty 1

## 2017-03-31 MED ORDER — HYDRALAZINE HCL 20 MG/ML IJ SOLN
10.0000 mg | Freq: Once | INTRAMUSCULAR | Status: DC
  Filled 2017-03-31: qty 1

## 2017-03-31 MED ORDER — HYDRALAZINE HCL 20 MG/ML IJ SOLN
5.0000 mg | Freq: Once | INTRAMUSCULAR | Status: AC
  Administered 2017-03-31: 5 mg via INTRAVENOUS
  Filled 2017-03-31: qty 1

## 2017-03-31 MED ORDER — HYDRALAZINE HCL 20 MG/ML IJ SOLN
5.0000 mg | INTRAMUSCULAR | Status: DC | PRN
  Administered 2017-03-31: 5 mg via INTRAVENOUS

## 2017-03-31 MED ORDER — METOPROLOL SUCCINATE ER 25 MG PO TB24
25.0000 mg | ORAL_TABLET | Freq: Every day | ORAL | Status: DC
  Administered 2017-03-31: 25 mg via ORAL
  Filled 2017-03-31 (×3): qty 1

## 2017-03-31 MED ORDER — ASPIRIN 325 MG PO TABS
325.0000 mg | ORAL_TABLET | Freq: Every day | ORAL | Status: DC
  Filled 2017-03-31 (×2): qty 1

## 2017-03-31 MED ORDER — SODIUM CHLORIDE 0.9% FLUSH
3.0000 mL | Freq: Two times a day (BID) | INTRAVENOUS | Status: DC
  Administered 2017-03-31 – 2017-04-01 (×2): 3 mL via INTRAVENOUS

## 2017-03-31 MED ORDER — INSULIN ASPART 100 UNIT/ML ~~LOC~~ SOLN
0.0000 [IU] | Freq: Three times a day (TID) | SUBCUTANEOUS | Status: DC
  Administered 2017-04-01: 1 [IU] via SUBCUTANEOUS

## 2017-03-31 MED ORDER — ADULT MULTIVITAMIN W/MINERALS CH
1.0000 | ORAL_TABLET | Freq: Every day | ORAL | Status: DC
Start: 2017-03-31 — End: 2017-04-02
  Filled 2017-03-31 (×2): qty 1

## 2017-03-31 MED ORDER — SODIUM CHLORIDE 0.9 % IV BOLUS (SEPSIS)
1000.0000 mL | Freq: Once | INTRAVENOUS | Status: AC
  Administered 2017-03-31: 1000 mL via INTRAVENOUS

## 2017-03-31 MED ORDER — METOCLOPRAMIDE HCL 5 MG/ML IJ SOLN
10.0000 mg | Freq: Once | INTRAMUSCULAR | Status: AC
  Administered 2017-03-31: 10 mg via INTRAVENOUS
  Filled 2017-03-31: qty 2

## 2017-03-31 MED ORDER — PANTOPRAZOLE SODIUM 40 MG PO TBEC
40.0000 mg | DELAYED_RELEASE_TABLET | Freq: Every day | ORAL | Status: DC
  Administered 2017-03-31: 40 mg via ORAL
  Filled 2017-03-31: qty 1

## 2017-03-31 MED ORDER — ACETAMINOPHEN 650 MG RE SUPP: 650.0000 mg | Freq: Four times a day (QID) | RECTAL | Status: DC | PRN

## 2017-03-31 NOTE — Telephone Encounter (Signed)
Patients husband calling regarding some symptoms she is having now, just like the ones she was having before she went in the hospital  Would like call back asap  [phone]

## 2017-03-31 NOTE — Telephone Encounter (Signed)
Appointment made per [facility]

## 2017-03-31 NOTE — ED Notes (Signed)
Pt placed on bedside commode. Pt states that afterwards her headache started increasing and had a flushed feeling. Denies dizziness, lightheadedness.

## 2017-03-31 NOTE — ED Notes (Signed)
Patient transported to CT 

## 2017-03-31 NOTE — ED Provider Notes (Signed)
[name] DEPT Provider Note   CSN: [id number] Arrival date & time: 03/31/17  1524     History   Chief Complaint Chief Complaint  Patient presents with  . Abdominal Pain  . Headache    HPI Jill [name] is a 61 y.o. female.  HPI   61 yo F with PMHx HLD, pre-diabetes, recent hospitalization for pancreatitis here with headache. Pt states Jill [name] was just discharged from the facility following hospitalization for idiopathic pancreatitis. Jill [name] was feeling better until earlier this morning, when Jill [name] developed gradual onset of progressively worsening generalized headache. HA is aching, throbbing, and severe. Worse with movement and light. No fever ro neck stiffness. Jill [name] was also markedly HTN at the time. En route, EMS reports HTN to 270J systolic with frequent PVCs. Jill [name] has associated mild "tingling" and lightheadedness when transitioning from sitting to standing. No focal weakness or numbness. Jill [name] is not on any blood thinners.  Past Medical History:  Diagnosis Date  . Arthritis    "hands" (03/24/2017)  . Colon polyp   . High cholesterol   . PAT (paroxysmal atrial tachycardia) ([place])    [name] 03/24/2017  . Peptic ulcer    "when I was a child"  . PONV (postoperative nausea and vomiting)    "bad when they did my wisdom teeth"  . Pre-diabetes   . Seasonal allergies     Patient Active Problem List   Diagnosis Date Noted  . Abdominal pain   . Pancreatitis 03/27/2017  . Hypertensive urgency 03/24/2017  . Sinusitis 03/24/2017  . Leukocytosis 03/24/2017  . Headache 03/24/2017  . Hyperglycemia 03/24/2017  . Atrial tachycardia, paroxysmal ([place]) 03/24/2017  . HLD (hyperlipidemia) 10/29/2016    Past Surgical History:  Procedure Laterality Date  . CERVICAL CERCLAGE  X 2  . COLONOSCOPY W/ BIOPSIES AND POLYPECTOMY    . TONSILLECTOMY    . WISDOM TOOTH EXTRACTION      OB History    No data available       Home Medications    Prior to Admission medications   Medication Sig  Start Date End Date Taking? Authorizing Provider  cholecalciferol (VITAMIN D) 1000 UNITS tablet Take 1,000 Units by mouth daily.    Historical Provider, MD  metoprolol succinate (TOPROL-XL) 25 MG 24 hr tablet Take 1 tablet (25 mg total) by mouth daily. 03/25/17   Nishant Dhungel, MD  Multiple Vitamin (MULTIVITAMIN WITH MINERALS) TABS tablet Take 1 tablet by mouth daily.    Historical Provider, MD  traMADol (ULTRAM) 50 MG tablet Take 1 tablet (50 mg total) by mouth every 6 (six) hours as needed for moderate pain. 03/30/17   Aline August, MD    Family History Family History  Problem Relation Age of Onset  . Dementia Mother   . Diabetes Father   . Heart disease Father   . Hypertension Father   . Cancer Sister     endometrial  . Cancer Maternal Aunt     ovarian    Social History Social History  Substance Use Topics  . Smoking status: Never Smoker  . Smokeless tobacco: Never Used  . Alcohol use Yes     Comment: 03/24/2017 "might average 1 drink/month"     Allergies   Codeine; Contrast media [iodinated diagnostic agents]; Tessalon [benzonatate]; Valium [diazepam]; Zyrtec [cetirizine]; and Penicillins   Review of Systems Review of Systems  Constitutional: Positive for fatigue. Negative for chills and fever.  HENT: Negative for congestion and rhinorrhea.   Eyes: Negative for visual disturbance.  Respiratory: Negative for cough and wheezing.   Cardiovascular: Negative for chest pain and leg swelling.  Gastrointestinal: Negative for abdominal pain, diarrhea, nausea and vomiting.  Genitourinary: Negative for dysuria and flank pain.  Musculoskeletal: Negative for neck pain and neck stiffness.  Skin: Negative for rash and wound.  Allergic/Immunologic: Negative for immunocompromised state.  Neurological: Positive for light-headedness and headaches. Negative for syncope and weakness.  All other systems reviewed and are negative.    Physical Exam Updated Vital Signs There were no  vitals taken for this visit.  Physical Exam  Constitutional: Jill [name] is oriented to person, place, and time. Jill [name] appears well-developed and well-nourished. No distress.  HENT:  Head: Normocephalic and atraumatic.  Eyes: Conjunctivae are normal.  Neck: Neck supple.  Cardiovascular: Normal rate, regular rhythm and normal heart sounds.  Exam reveals no friction rub.   No murmur heard. Pulmonary/Chest: Effort normal and breath sounds normal. No respiratory distress. Jill [name] has no wheezes. Jill [name] has no rales.  Abdominal: Jill [name] exhibits no distension. There is tenderness in the epigastric area.  Musculoskeletal: Jill [name] exhibits no edema.  Neurological: Jill [name] is alert and oriented to person, place, and time. Jill [name] has normal strength. No cranial nerve deficit or sensory deficit. Jill [name] exhibits normal muscle tone.  Skin: Skin is warm. Capillary refill takes less than 2 seconds.  Psychiatric: Jill [name] has a normal mood and affect.  Nursing note and vitals reviewed.   Neurological Exam:  Mental Status: Alert and oriented to person, place, and time. Attention and concentration normal. Speech clear. Recent memory is intact. Cranial Nerves: Visual fields grossly intact. EOMI and PERRLA. No nystagmus noted. Facial sensation intact at forehead, maxillary cheek, and chin/mandible bilaterally. No facial asymmetry or weakness. Hearing grossly normal. Uvula is midline, and palate elevates symmetrically. Normal SCM and trapezius strength. Tongue midline without fasciculations. Motor: Muscle strength 5/5 in proximal and distal UE and LE bilaterally. No pronator drift. Muscle tone normal. Reflexes: 2+ and symmetrical in all four extremities.  Sensation: Intact to light touch in upper and lower extremities distally bilaterally.  Gait: Normal without ataxia. Coordination: Normal FTN bilaterally.   ED Treatments / Results  Labs (all labs ordered are listed, but only abnormal results are displayed) Labs Reviewed  CBC WITH  DIFFERENTIAL/PLATELET  COMPREHENSIVE METABOLIC PANEL  MAGNESIUM  URINALYSIS, ROUTINE W REFLEX MICROSCOPIC  LIPASE, BLOOD  I-STAT TROPOININ, ED    EKG  EKG Interpretation None       Radiology No results found.  Procedures Procedures (including critical care time)  Medications Ordered in ED Medications  sodium chloride 0.9 % bolus 1,000 mL (not administered)  metoCLOPramide (REGLAN) injection 10 mg (not administered)  diphenhydrAMINE (BENADRYL) injection 25 mg (not administered)  fentaNYL (SUBLIMAZE) injection 50 mcg (not administered)     Initial Impression / Assessment and Plan / ED Course  I have reviewed the triage vital signs and the nursing notes.  Pertinent labs & imaging results that were available during my care of the patient were reviewed by me and considered in my medical decision making (see chart for details).     61 year old with past medical history as above including recent admissions for hypertension and pancreatitis here with moderate headache. On arrival, patient is markedly hypertensive but neuro exam is non-focal. Lab work is overall reassuring and CT head is negative, but pt noted to be persistently hypertensive requiring multiple doses of antihypertensive IV. I am concerned pt has recurrent HTN urgency, of unknown etiology. Of note, pt does have adrenal nodule  on CT - concern for possible pheo. Will admit for HTN urgency, further work-up and sx control. EKG non-ischemic, denies CP at t his time.  Final Clinical Impressions(s) / ED Diagnoses   Final diagnoses:  Hypertensive urgency    New Prescriptions New Prescriptions   No medications on file     Duffy Bruce, MD 04/01/17 1201

## 2017-03-31 NOTE — H&P (Addendum)
History and Physical    Jill [name] FBP:[id number] DOB: [birth year] DOA: 03/31/2017  Referring MD/NP/PA:   PCP: Odette Fraction, MD   Patient coming from:  The patient is coming from home.  At baseline, pt is independent for most of ADL.   Chief Complaint: Headache, chest pain, abdominal pain  HPI: Jill [name] is a 61 y.o. female with medical history significant of prediabetes, recently diagnosed hypertension, hyperlipidemia, peptic ulcer disease, arthritis, paroxysmal atrial tachycardia, who presents with headache, chest pain and abdominal pain.  Pt was recently hospitalized due to acute pancreatitis from 4/27-4/30/18. Pt had RUQ [place] on 4/27 which was negative for cholelithiasis. Etiology was not clear for pancreatitis. Improving recent admission, patient was also found to have hypertensive urgency. Patient did not have history of hypertension before admission. Pt was discharged at stable condition after abdominal pain had resolved and blood pressure was controlled.  Patient states that she started having headache, chest pain and abdominal pain since this morning. Her abdominal pain is located in the epigastric area, constant, 3 out of 10 in severity. It is associated with nausea and vomiting. She had nonbloody vomiting 3 times. No diarrhea. Currently her abdominal pain has subsided. Her chest pain is located in the substernal area, very mild per patient, which has resolved currently. She also has frontal headache, which is moderate and constant, dull. It has resolved currently. Patient denies dysuria, burning on urination or increased urinary frequency. No hematuria. Patient does not have unilateral weakness, numbness in Extremities. No facial droop or slurred speech. Initially patient had elevated blood pressure at 212/73, which has improved to 171/82 after treated with IV hydralazine 5 mg 2 in ED.   ED Course: pt was found to have Negative troponin, WBC 15.9, lipase 37, electrolytes renal  function okay, temperature normal, bradycardia, oxygen saturation 97% on room air. CT head is negative for acute intracranial abnormalities, but showed fluid-air level in the right sphenoid sinus. Pt is placed on telemetry bed for observation  CT-abdomen/pelvis:  1. No acute finding.  No appendicitis. 2. Resolved peripancreatic edema and possible proximal bowel thickening on recent scan. 3. 2.5 cm right adrenal nodule that is indeterminate. In this patient who is allergic to contrast, recommend MRI follow-up. 4. Possible cyst in the uncinate of the pancreas measuring 8 mm, attention on follow-up. 5.  Aortic Atherosclerosis (ICD10-I70.0).  Review of Systems:   General: no fevers, chills, no changes in body weight, has poor appetite, has fatigue HEENT: no blurry vision, hearing changes or sore throat Respiratory: no dyspnea, coughing, wheezing CV: has chest pain, no palpitations GI: has nausea, vomiting, abdominal pain, no diarrhea, constipation GU: no dysuria, burning on urination, increased urinary frequency, hematuria  Ext: no leg edema Neuro: no unilateral weakness, numbness, or tingling, no vision change or hearing loss Skin: no rash, no skin tear. MSK: No muscle spasm, no deformity, no limitation of range of movement in spin Heme: No easy bruising.  Travel history: No recent long distant travel.  Allergy:  Allergies  Allergen Reactions  . Tessalon [Benzonatate] Anaphylaxis    Per pt this mixed with Zyrtec made her throat "close up"  . Zyrtec [Cetirizine] Anaphylaxis    Per pt this mixed with Tessalon made her throat "close up"  . Codeine Nausea Only    Dizziness  . Contrast Media [Iodinated Diagnostic Agents] Hives  . Valium [Diazepam] Nausea Only and Other (See Comments)    Dizziness (also)  . Penicillins Rash    Past Medical  History:  Diagnosis Date  . Arthritis    "hands" (03/24/2017)  . Colon polyp   . High cholesterol   . PAT (paroxysmal atrial tachycardia) ([place])     [name] 03/24/2017  . Peptic ulcer    "when I was a child"  . PONV (postoperative nausea and vomiting)    "bad when they did my wisdom teeth"  . Pre-diabetes   . Seasonal allergies     Past Surgical History:  Procedure Laterality Date  . CERVICAL CERCLAGE  X 2  . COLONOSCOPY W/ BIOPSIES AND POLYPECTOMY    . TONSILLECTOMY    . WISDOM TOOTH EXTRACTION      Social History:  reports that she has never smoked. She has never used smokeless tobacco. She reports that she drinks alcohol. She reports that she does not use drugs.  Family History:  Family History  Problem Relation Age of Onset  . Dementia Mother   . Diabetes Father   . Heart disease Father   . Hypertension Father   . Cancer Sister     endometrial  . Cancer Maternal Aunt     ovarian     Prior to Admission medications   Medication Sig Start Date End Date Taking? Authorizing Provider  cholecalciferol (VITAMIN D) 1000 UNITS tablet Take 1,000 Units by mouth daily.    Historical Provider, MD  metoprolol succinate (TOPROL-XL) 25 MG 24 hr tablet Take 1 tablet (25 mg total) by mouth daily. 03/25/17   Nishant Dhungel, MD  Multiple Vitamin (MULTIVITAMIN WITH MINERALS) TABS tablet Take 1 tablet by mouth daily.    Historical Provider, MD  traMADol (ULTRAM) 50 MG tablet Take 1 tablet (50 mg total) by mouth every 6 (six) hours as needed for moderate pain. 03/30/17   Aline August, MD    Physical Exam: Vitals:   03/31/17 2000 03/31/17 2015 03/31/17 2030 03/31/17 2045  BP: (!) 181/76 (!) 171/70 (!) 175/75 (!) 173/77  Pulse: 73 73 68 67  Resp: 18 20 16  (!) 22  Temp:      TempSrc:      SpO2: 97% 97% 97% 98%   General: Not in acute distress HEENT:       Eyes: PERRL, EOMI, no scleral icterus.       ENT: No discharge from the ears and nose, no pharynx injection, no tonsillar enlargement.        Neck: No JVD, no bruit, no mass felt. Heme: No neck lymph node enlargement. Cardiac: S1/S2, RRR, No murmurs, No gallops or  rubs. Respiratory:  No rales, wheezing, rhonchi or rubs. GI: Soft, nondistended, nontender, no rebound pain, no organomegaly, BS present. GU: No hematuria Ext: No pitting leg edema bilaterally. 2+DP/PT pulse bilaterally. Musculoskeletal: No joint deformities, No joint redness or warmth, no limitation of ROM in spin. Skin: No rashes.  Neuro: Alert, oriented X3, cranial nerves II-XII grossly intact, moves all extremities normally. Muscle strength 5/5 in all extremities, sensation to light touch intact. Brachial reflex 2+ bilaterally. Negative Babinski's sign. Normal finger to nose test. Psych: Patient is not psychotic, no suicidal or hemocidal ideation.  Labs on Admission: I have personally reviewed following labs and imaging studies  CBC:  Recent Labs Lab 03/26/17 0957 03/27/17 1327 03/28/17 0021 03/29/17 0404 03/30/17 0428 03/31/17 1554  WBC 12.4* 16.4* 11.4* 8.6 7.9 15.9*  NEUTROABS 10,292*  --   --   --   --  12.9*  HGB 11.9* 13.1 12.7 12.5 12.0 14.5  HCT 36.3 40.5 37.8 38.4 36.8  42.9  MCV 81.4 80.7 79.2 80.2 80.0 79.2  PLT 369 390 281 267 262 253   Basic Metabolic Panel:  Recent Labs Lab 03/27/17 1327 03/28/17 0021 03/29/17 0404 03/30/17 0428 03/31/17 1554  NA 137 137 140 139 136  K 3.4* 3.2* 4.1 3.2* 3.5  CL 101 102 105 104 99*  CO2 24 26 28 29 25   GLUCOSE 233* 153* 125* 107* 292*  BUN 19 13 6 7 20   CREATININE 0.75 0.71 0.63 0.63 0.89  CALCIUM 8.8* 8.0* 8.7* 8.6* 8.9  MG  --  1.9  --   --  2.3   GFR: Estimated Creatinine Clearance: 56.3 mL/min (by C-G formula based on SCr of 0.89 mg/dL). Liver Function Tests:  Recent Labs Lab 03/27/17 1327 03/28/17 0021 03/29/17 0404 03/30/17 0428 03/31/17 1554  AST 36 20 16 14* 55*  ALT 60* 40 31 25 52  ALKPHOS 71 61 54 51 68  BILITOT 0.5 0.4 0.6 0.4 0.7  PROT 5.9* 5.0* 5.1* 5.0* 6.3*  ALBUMIN 3.6 2.8* 2.6* 2.8* 3.4*    Recent Labs Lab 03/27/17 1327 03/29/17 0404 03/31/17 1554  LIPASE 113* 24 37   No  results for input(s): AMMONIA in the last 168 hours. Coagulation Profile: No results for input(s): INR, PROTIME in the last 168 hours. Cardiac Enzymes: No results for input(s): CKTOTAL, CKMB, CKMBINDEX, TROPONINI in the last 168 hours. BNP (last 3 results) No results for input(s): PROBNP in the last 8760 hours. HbA1C: No results for input(s): HGBA1C in the last 72 hours. CBG:  Recent Labs Lab 03/29/17 1212 03/29/17 1723 03/29/17 2320 03/30/17 0604 03/30/17 1201  GLUCAP 122* 137* 120* 101* 201*   Lipid Profile:  Recent Labs  03/30/17 0428  CHOL 197  HDL 48  LDLCALC 103*  TRIG 232*  CHOLHDL 4.1   Thyroid Function Tests: No results for input(s): TSH, T4TOTAL, FREET4, T3FREE, THYROIDAB in the last 72 hours. Anemia Panel: No results for input(s): VITAMINB12, FOLATE, FERRITIN, TIBC, IRON, RETICCTPCT in the last 72 hours. Urine analysis:    Component Value Date/Time   COLORURINE YELLOW 03/27/2017 1635   APPEARANCEUR CLEAR 03/27/2017 1635   LABSPEC 1.021 03/27/2017 1635   PHURINE 5.0 03/27/2017 1635   GLUCOSEU >=500 (A) 03/27/2017 1635   HGBUR SMALL (A) 03/27/2017 1635   BILIRUBINUR NEGATIVE 03/27/2017 1635   KETONESUR NEGATIVE 03/27/2017 1635   PROTEINUR NEGATIVE 03/27/2017 1635   NITRITE NEGATIVE 03/27/2017 1635   LEUKOCYTESUR MODERATE (A) 03/27/2017 1635   Sepsis Labs: @LABRCNTIP (procalcitonin:4,lacticidven:4) )No results found for this or any previous visit (from the past 240 hour(s)).   Radiological Exams on Admission: Ct Abdomen Pelvis Wo Contrast  Result Date: 03/31/2017 CLINICAL DATA:  Abdominal pain with vomiting. EXAM: CT ABDOMEN AND PELVIS WITHOUT CONTRAST TECHNIQUE: Multidetector CT imaging of the abdomen and pelvis was performed following the standard protocol without IV contrast. COMPARISON:  03/27/2017 FINDINGS: Lower chest:  Negative Hepatobiliary: No focal liver abnormality.No evidence of biliary obstruction or stone. Pancreas: Resolved peripancreatic  edema. 8 mm low-density in the uncinate region which may reflect a cyst or other lesion. Spleen: Unremarkable. Adrenals/Urinary Tract: 2.5 cm right adrenal mass with soft tissue density. No hydronephrosis or stone. Unremarkable bladder. Stomach/Bowel: No noted obstruction or inflammation. Gastric antrum has a normalized appearance. No appendicitis. Vascular/Lymphatic: No acute vascular abnormality. Aortoiliac atherosclerosis. No mass or adenopathy. Reproductive:No pathologic findings. Other: No ascites or pneumoperitoneum. Musculoskeletal: No acute abnormalities. IMPRESSION: 1. No acute finding.  No appendicitis. 2. Resolved peripancreatic edema and possible proximal  bowel thickening on recent scan. 3. 2.5 cm right adrenal nodule that is indeterminate. In this patient who is allergic to contrast, recommend MRI follow-up. 4. Possible cyst in the uncinate of the pancreas measuring 8 mm, attention on follow-up. 5.  Aortic Atherosclerosis (ICD10-I70.0). Electronically Signed   By: Monte Fantasia M.D.   On: 03/31/2017 20:18   Ct Head Wo Contrast  Result Date: 03/31/2017 CLINICAL DATA:  Migraine headaches EXAM: CT HEAD WITHOUT CONTRAST TECHNIQUE: Contiguous axial images were obtained from the base of the skull through the vertex without intravenous contrast. COMPARISON:  03/27/2017 FINDINGS: Brain: No evidence of acute infarction, hemorrhage, hydrocephalus, extra-axial collection or mass lesion/mass effect. Vascular: No hyperdense vessel or unexpected calcification. Skull: Normal. Negative for fracture or focal lesion. Sinuses/Orbits: Air-fluid level within the right sphenoid sinus. Other: No acute abnormality noted. IMPRESSION: No acute intracranial abnormality noted. Air-fluid level in the right sphenoid sinus. This may contribute to the patient's headaches. Electronically Signed   By: Inez Catalina M.D.   On: 03/31/2017 16:24     EKG: Independently reviewed.  Sinus rhythm, QTC 432, LVH, nonspecific T-wave  change.   Assessment/Plan Principal Problem:   Hypertensive urgency Active Problems:   Leukocytosis   Headache   Atrial tachycardia, paroxysmal (HCC)   Abdominal pain   Adrenal nodule ([facility])   Pancreatic cyst   Chest pain   Prediabetes   Hypertensive urgency: Initially patient had elevated blood pressure at 212/73, which has improved to 171/82 after treated with IV hydralazine 5 mg 2 in ED. patient did not have history of hypertension until previous admission. She seems to have episodic elevation of blood pressure. She was found to have an adrenal nodule by CT scan in previous admission, which is confirmed by CT scan of abdomen/pelvis today. The pheochromocytoma is potential differential diagnosis.  -Will admit to tele bed for obs -prn IV hydralazine, the of goal of bp reduction is by about 25 to 30% in first several hours, at SBP 160 to 180 mmHg. -continue home metoprolol -Add amlodipine 10 mg daily -Frequent neuro check  -check metanephrine level and UDS  Headache:  Most likely due to hypertensive urgency. Headache has resolved currently. -Blood pressure control as above -When necessary Tylenol   Chest pain: Patient had mild substernal chest pain, most likely due to demand ischemia secondary to hypertensive urgency. Patient had A1c 6.1 on 03/10/17 and LDL 103 on 03/30/17. Will not repeat A1c and FLP  cycle CE q6 x3 and repeat EKG in the am  - prn Nitroglycerin, Morphine - start ASA-->will also start protonix given hx of PUD. - 2d echo  Atrial tachycardia, paroxysmal: -continue home metoprolol  Prediabetes: A1c 6.1 on 03/10/17 -SSI  Abdominal pain: Etiology is not clear. Lipase normal.  CT scan showed resolved peripancreatic edema. Her abdominal pain has resolved currently. -When necessary Zofran for nausea, morphine for pain  Leukocytosis: no signs of infection. No respiratory symptoms. Patient denies symptoms of UTI. Likely due to stress induced to  demargination. -follow up by CBC -f/u UA  Adrenal nodule: CT-scan showed 2.5 cm right adrenal nodule -need to f/u with PCP   Pancreatic cyst: CT scan showed possible cyst in the uncinate of the pancreas measuring 8 mm -need to f/u with PCP    DVT ppx: SQ Lovenox Code Status: Full code Family Communication: None at bed side.    Disposition Plan:  Anticipate discharge back to previous home environment Consults called:  none Admission status: Obs / tele  Date of Service 03/31/2017    Ivor Costa [facility] Pager [phone]  If 7PM-7AM, please contact night-coverage [email or web address] Password [email or web address] 03/31/2017, 9:06 PM

## 2017-03-31 NOTE — Progress Notes (Signed)
CRITICAL VALUE ALERT  Critical value received:  Troponin 0.09  Date of notification:  5/1  Time of notification:  11pm  Critical value read back:Yes.    Nurse who received alert:  A Zenita Kister, RN  MD notified (1st page):  [facility]  Time of first page:  11pm  No further orders at this time. Pt asymptomatic resting in bed.

## 2017-03-31 NOTE — ED Notes (Signed)
Phlebotomy at bedside.

## 2017-03-31 NOTE — ED Notes (Signed)
MD at bedside at this time.

## 2017-03-31 NOTE — ED Triage Notes (Signed)
Per EMS:  Pt presents to ED for assessment of headache, abdominal pain and chest pain earlier this morning.   Pt recently d/c'd from hospital for unknown origin pancreatitis.  EMS states multiple PVCs en route.  Pt given 36mcg Fentanyl and 4mg  Zofran en route.  Pt denies pain at this time.

## 2017-03-31 NOTE — ED Notes (Signed)
MD at bedside. 

## 2017-04-01 ENCOUNTER — Observation Stay (HOSPITAL_COMMUNITY): Payer: BC Managed Care – PPO

## 2017-04-01 DIAGNOSIS — D72829 Elevated white blood cell count, unspecified: Secondary | ICD-10-CM | POA: Diagnosis not present

## 2017-04-01 DIAGNOSIS — I160 Hypertensive urgency: Secondary | ICD-10-CM | POA: Diagnosis not present

## 2017-04-01 DIAGNOSIS — I10 Essential (primary) hypertension: Secondary | ICD-10-CM | POA: Diagnosis not present

## 2017-04-01 DIAGNOSIS — E279 Disorder of adrenal gland, unspecified: Secondary | ICD-10-CM | POA: Diagnosis not present

## 2017-04-01 DIAGNOSIS — I471 Supraventricular tachycardia: Secondary | ICD-10-CM | POA: Diagnosis not present

## 2017-04-01 LAB — GLUCOSE, CAPILLARY
GLUCOSE-CAPILLARY: 126 mg/dL — AB (ref 65–99)
GLUCOSE-CAPILLARY: 144 mg/dL — AB (ref 65–99)
Glucose-Capillary: 129 mg/dL — ABNORMAL HIGH (ref 65–99)
Glucose-Capillary: 146 mg/dL — ABNORMAL HIGH (ref 65–99)

## 2017-04-01 LAB — BASIC METABOLIC PANEL
ANION GAP: 7 (ref 5–15)
BUN: 16 mg/dL (ref 6–20)
CHLORIDE: 105 mmol/L (ref 101–111)
CO2: 26 mmol/L (ref 22–32)
Calcium: 8.8 mg/dL — ABNORMAL LOW (ref 8.9–10.3)
Creatinine, Ser: 0.62 mg/dL (ref 0.44–1.00)
GFR calc Af Amer: >60 mL/min (ref >60)
GLUCOSE: 116 mg/dL — AB (ref 65–99)
POTASSIUM: 3.7 mmol/L (ref 3.5–5.1)
SODIUM: 138 mmol/L (ref 135–145)

## 2017-04-01 LAB — TROPONIN I
TROPONIN I: 0.08 ng/mL — AB (ref <0.03)
Troponin I: 0.06 ng/mL (ref <0.03)

## 2017-04-01 LAB — CBC
HEMATOCRIT: 39.7 % (ref 36.0–46.0)
HEMOGLOBIN: 13 g/dL (ref 12.0–15.0)
MCH: 26.1 pg (ref 26.0–34.0)
MCHC: 32.7 g/dL (ref 30.0–36.0)
MCV: 79.7 fL (ref 78.0–100.0)
Platelets: 332 10*3/uL (ref 150–400)
RBC: 4.98 MIL/uL (ref 3.87–5.11)
RDW: 13.4 % (ref 11.5–15.5)
WBC: 11.2 10*3/uL — AB (ref 4.0–10.5)

## 2017-04-01 MED ORDER — SODIUM CHLORIDE 0.9 % IV SOLN
INTRAVENOUS | Status: DC
  Administered 2017-04-01: 17:00:00 via INTRAVENOUS

## 2017-04-01 NOTE — Progress Notes (Signed)
Informed by NT that Pt unable to tolerate full set of orthostatics.  Results positive from lying to sitting, see flowsheet.  Will inform MD.

## 2017-04-01 NOTE — Progress Notes (Signed)
Called to room by Pts husband who said Pt was experiencing "vision changes."  On assessment Pt sitting up in bed to eat, states her vision became blurry and she felt some numbness and tingling in her right arm radiating across her chest.  BP taken sitting and then 5 mins later laying down - see flowsheets.  Pt states she feels "a little better" laying down.  MD paged as well as RR.

## 2017-04-01 NOTE — Progress Notes (Signed)
Called to bedside to assist with assessment of patient with changes in vision and right  arm tingling.  On arrival patient supine in bed.  Alert warm and dry - oriented x4 appropriate.  Patient states she sat up to try to eat breakfast and had onset dizziness and left arm tingling that radiated across upper chest to left shoulder. Denies SOB, nausea or vomiting.  She has recent hx of atrial tachycardia - denies feeling event this time - no cardiac events recorded on monitor per central monitor techs.  Her sx are better - tingling is resolved - vision almost at baseline.  No loss of vision - only blurred she states.  RN  Nego at bedside.  BP 15 mins after sitting was 143/65 with NSR 60.  Now after lying in bed at 30 degrees BP is 125/56.  No focal neuro deficits noted. Patient states this is 3rd hospitalization with similar events.  No indications for code stroke at this point - Dr. Eliseo Squires aware - at bedside.  Call as needed.

## 2017-04-01 NOTE — Progress Notes (Signed)
PROGRESS NOTE    Jill [name]  FUX:[id number] DOB: Sep 28, 1956 DOA: 03/31/2017 PCP: Odette Fraction, MD   Outpatient Specialists:     Brief Narrative:  Jill [name] is a 61 y.o. female with medical history significant of prediabetes, recently diagnosed hypertension, hyperlipidemia, peptic ulcer disease, arthritis, paroxysmal atrial tachycardia, who presents with headache, chest pain and abdominal pain.  Pt was recently hospitalized due to acute pancreatitis from 4/27-4/30/18. Pt had RUQ USon 4/27 which was negative for cholelithiasis. Etiology was not clear for pancreatitis. Patient was also hospitalized from 4/23-4/24 and found to have hypertensive urgency. Patient did not have history of hypertension before admission.   Patient was seen by her PCP and BP was high and this was attributed to anxiety  Patient currently has multiple complaints including chest pain, numbness and tingling down right arm as well as BP was elevated to > 200 upon admission.  Assessment & Plan:   Principal Problem:   Hypertensive urgency Active Problems:   Leukocytosis   Headache   Atrial tachycardia, paroxysmal (HCC)   Abdominal pain   Adrenal nodule (HCC)   Pancreatic cyst   Chest pain   Prediabetes   Hypertensive urgency: Initially patient had elevated blood pressure at 212/73, which has improved to 171/82 after treated with IV hydralazine 5 mg 2 in ED. -h/o MFAT- is following up with Dr. Meda Coffee -tele -continue BB  Right arm numbness -MRI brain -orthostatics  Headache:  Most likely due to hypertensive urgency. Headache has resolved currently. -Blood pressure control as above   Chest pain with mildly elevaetd troponin -demand ischemia secondary to hypertensive urgency. Patient had A1c 6.1 on 03/10/17 and LDL 103 on 03/30/17.  - start ASA-->will also start protonix given hx of PUD. - 2d echo was done during previous admission, so will not repeat  Atrial tachycardia,  paroxysmal: -continue home metoprolol  Prediabetes: A1c 6.1 on 03/10/17 -SSI  Abdominal pain: Etiology is not clear. Lipase normal.  CT scan showed resolved peripancreatic edema. Her abdominal pain has resolved currently.  Leukocytosis: no signs of infection. No respiratory symptoms. Patient denies symptoms of UTI. Likely due to stress induced to demargination. -follow up by CBC  Adrenal nodule: CT-scan showed 2.5 cm right adrenal nodule -MRI ordered to clarify as had CT contrast allergy -Dr. Blaine Hamper ordered plasma metanephrine pending  Pancreatic cyst: CT scan showed possible cyst in the uncinate of the pancreas measuring 8 mm -need to f/u with PCP    DVT prophylaxis:  Lovenox   Code Status: Full Code   Family Communication:   Disposition Plan:     Consultants:       Subjective: After sitting up, began having vision changes, numbness in right arm-- symptoms improved with laying down  Objective: Vitals:   04/01/17 0558 04/01/17 1015 04/01/17 1023 04/01/17 1035  BP: (!) 128/52 (!) 143/65 (!) 125/56 (!) 126/52  Pulse: 78 60    Resp:      Temp:      TempSrc:      SpO2:  98%    Weight:      Height:       No intake or output data in the 24 hours ending 04/01/17 1313 Filed Weights   03/31/17 2222  Weight: 60.3 kg (132 lb 14.4 oz)    Examination:  General exam: Appears calm and comfortable  Respiratory system: Clear to auscultation. Respiratory effort normal. Cardiovascular system: S1 & S2 heard, RRR. No JVD, murmurs, rubs, gallops or clicks. No pedal edema.  Gastrointestinal system: Abdomen is nondistended, soft and nontender. No organomegaly or masses felt. Normal bowel sounds heard. Central nervous system: Alert and oriented. No focal neurological deficits. Extremities: Symmetric 5 x 5 power. Skin: No rashes, lesions or ulcers Psychiatry: Judgement and insight appear normal. Mood & affect appropriate.     Data Reviewed: I have personally reviewed  following labs and imaging studies  CBC:  Recent Labs Lab 03/26/17 0957  03/28/17 0021 03/29/17 0404 03/30/17 0428 03/31/17 1554 04/01/17 0801  WBC 12.4*  < > 11.4* 8.6 7.9 15.9* 11.2*  NEUTROABS 10,292*  --   --   --   --  12.9*  --   HGB 11.9*  < > 12.7 12.5 12.0 14.5 13.0  HCT 36.3  < > 37.8 38.4 36.8 42.9 39.7  MCV 81.4  < > 79.2 80.2 80.0 79.2 79.7  PLT 369  < > 281 267 262 393 332  < > = values in this interval not displayed. Basic Metabolic Panel:  Recent Labs Lab 03/28/17 0021 03/29/17 0404 03/30/17 0428 03/31/17 1554 04/01/17 0801  NA 137 140 139 136 138  K 3.2* 4.1 3.2* 3.5 3.7  CL 102 105 104 99* 105  CO2 26 28 29 25 26   GLUCOSE 153* 125* 107* 292* 116*  BUN 13 6 7 20 16   CREATININE 0.71 0.63 0.63 0.89 0.62  CALCIUM 8.0* 8.7* 8.6* 8.9 8.8*  MG 1.9  --   --  2.3  --    GFR: Estimated Creatinine Clearance: 62.6 mL/min (by C-G formula based on SCr of 0.62 mg/dL). Liver Function Tests:  Recent Labs Lab 03/27/17 1327 03/28/17 0021 03/29/17 0404 03/30/17 0428 03/31/17 1554  AST 36 20 16 14* 55*  ALT 60* 40 31 25 52  ALKPHOS 71 61 54 51 68  BILITOT 0.5 0.4 0.6 0.4 0.7  PROT 5.9* 5.0* 5.1* 5.0* 6.3*  ALBUMIN 3.6 2.8* 2.6* 2.8* 3.4*    Recent Labs Lab 03/27/17 1327 03/29/17 0404 03/31/17 1554  LIPASE 113* 24 37   No results for input(s): AMMONIA in the last 168 hours. Coagulation Profile: No results for input(s): INR, PROTIME in the last 168 hours. Cardiac Enzymes:  Recent Labs Lab 03/31/17 2051 04/01/17 0406 04/01/17 0801  TROPONINI 0.09* 0.08* 0.06*   BNP (last 3 results) No results for input(s): PROBNP in the last 8760 hours. HbA1C: No results for input(s): HGBA1C in the last 72 hours. CBG:  Recent Labs Lab 03/30/17 0604 03/30/17 1201 03/31/17 2229 04/01/17 0602 04/01/17 1105  GLUCAP 101* 201* 194* 129* 126*   Lipid Profile:  Recent Labs  03/30/17 0428  CHOL 197  HDL 48  LDLCALC 103*  TRIG 232*  CHOLHDL 4.1    Thyroid Function Tests: No results for input(s): TSH, T4TOTAL, FREET4, T3FREE, THYROIDAB in the last 72 hours. Anemia Panel: No results for input(s): VITAMINB12, FOLATE, FERRITIN, TIBC, IRON, RETICCTPCT in the last 72 hours. Urine analysis:    Component Value Date/Time   COLORURINE YELLOW 03/31/2017 [place] 03/31/2017 1527   LABSPEC 1.018 03/31/2017 1527   PHURINE 5.0 03/31/2017 1527   GLUCOSEU 150 (A) 03/31/2017 1527   HGBUR NEGATIVE 03/31/2017 [place] 03/31/2017 1527   [place] 03/31/2017 1527   PROTEINUR NEGATIVE 03/31/2017 1527   NITRITE NEGATIVE 03/31/2017 1527   LEUKOCYTESUR NEGATIVE 03/31/2017 1527     )No results found for this or any previous visit (from the past 240 hour(s)).    Anti-infectives  None       Radiology Studies: Ct Abdomen Pelvis Wo Contrast  Result Date: 03/31/2017 CLINICAL DATA:  Abdominal pain with vomiting. EXAM: CT ABDOMEN AND PELVIS WITHOUT CONTRAST TECHNIQUE: Multidetector CT imaging of the abdomen and pelvis was performed following the standard protocol without IV contrast. COMPARISON:  03/27/2017 FINDINGS: Lower chest:  Negative Hepatobiliary: No focal liver abnormality.No evidence of biliary obstruction or stone. Pancreas: Resolved peripancreatic edema. 8 mm low-density in the uncinate region which may reflect a cyst or other lesion. Spleen: Unremarkable. Adrenals/Urinary Tract: 2.5 cm right adrenal mass with soft tissue density. No hydronephrosis or stone. Unremarkable bladder. Stomach/Bowel: No noted obstruction or inflammation. Gastric antrum has a normalized appearance. No appendicitis. Vascular/Lymphatic: No acute vascular abnormality. Aortoiliac atherosclerosis. No mass or adenopathy. Reproductive:No pathologic findings. Other: No ascites or pneumoperitoneum. Musculoskeletal: No acute abnormalities. IMPRESSION: 1. No acute finding.  No appendicitis. 2. Resolved peripancreatic edema and  possible proximal bowel thickening on recent scan. 3. 2.5 cm right adrenal nodule that is indeterminate. In this patient who is allergic to contrast, recommend MRI follow-up. 4. Possible cyst in the uncinate of the pancreas measuring 8 mm, attention on follow-up. 5.  Aortic Atherosclerosis (ICD10-I70.0). Electronically Signed   By: Monte Fantasia M.D.   On: 03/31/2017 20:18   Ct Head Wo Contrast  Result Date: 03/31/2017 CLINICAL DATA:  Migraine headaches EXAM: CT HEAD WITHOUT CONTRAST TECHNIQUE: Contiguous axial images were obtained from the base of the skull through the vertex without intravenous contrast. COMPARISON:  03/27/2017 FINDINGS: Brain: No evidence of acute infarction, hemorrhage, hydrocephalus, extra-axial collection or mass lesion/mass effect. Vascular: No hyperdense vessel or unexpected calcification. Skull: Normal. Negative for fracture or focal lesion. Sinuses/Orbits: Air-fluid level within the right sphenoid sinus. Other: No acute abnormality noted. IMPRESSION: No acute intracranial abnormality noted. Air-fluid level in the right sphenoid sinus. This may contribute to the patient's headaches. Electronically Signed   By: Inez Catalina M.D.   On: 03/31/2017 16:24        Scheduled Meds: . amLODipine  10 mg Oral Daily  . aspirin  325 mg Oral Daily  . cholecalciferol  1,000 Units Oral Daily  . enoxaparin (LOVENOX) injection  40 mg Subcutaneous Q24H  . insulin aspart  0-5 Units Subcutaneous QHS  . insulin aspart  0-9 Units Subcutaneous TID WC  . metoprolol succinate  25 mg Oral Daily  . multivitamin with minerals  1 tablet Oral Daily  . pantoprazole  40 mg Oral Q1200  . sodium chloride flush  3 mL Intravenous Q12H   Continuous Infusions:   LOS: 0 days    Time spent: 25 min    [place], DO [facility] Pager [phone]  If 7PM-7AM, please contact night-coverage [email or web address] Password [email or web address] 04/01/2017, 1:13 PM

## 2017-04-02 ENCOUNTER — Inpatient Hospital Stay: Payer: BC Managed Care – PPO | Admitting: Family Medicine

## 2017-04-02 DIAGNOSIS — E279 Disorder of adrenal gland, unspecified: Secondary | ICD-10-CM

## 2017-04-02 DIAGNOSIS — I471 Supraventricular tachycardia: Secondary | ICD-10-CM

## 2017-04-02 DIAGNOSIS — I160 Hypertensive urgency: Secondary | ICD-10-CM

## 2017-04-02 LAB — GLUCOSE, CAPILLARY
GLUCOSE-CAPILLARY: 128 mg/dL — AB (ref 65–99)
GLUCOSE-CAPILLARY: 122 mg/dL — AB (ref 65–99)
Glucose-Capillary: 94 mg/dL (ref 65–99)

## 2017-04-02 LAB — T4, FREE: FREE T4: 1.03 ng/dL (ref 0.61–1.12)

## 2017-04-02 LAB — LIPASE, BLOOD: LIPASE: 34 U/L (ref 11–51)

## 2017-04-02 MED ORDER — PANTOPRAZOLE SODIUM 40 MG PO TBEC: 40.0000 mg | DELAYED_RELEASE_TABLET | Freq: Every day | ORAL | 2 refills | Status: DC

## 2017-04-02 MED ORDER — ASPIRIN 325 MG PO TABS: 325.0000 mg | ORAL_TABLET | Freq: Every day | ORAL | 1 refills | Status: DC

## 2017-04-02 NOTE — Progress Notes (Signed)
Pt refusing all meds at this time. States "she feels great" and doesn't want to take anything until she speaks with a  Dr.

## 2017-04-02 NOTE — Consult Note (Signed)
Cardiology Consult    Patient ID: SHAQUISHA WYNN MRN: [id number], DOB/AGE: 01-Sep-1956   Admit date: 03/31/2017 Date of Consult: 04/02/2017  Primary Physician: Odette Fraction, MD Primary Cardiologist: Dr. Meda Coffee Requesting Provider: Dr. Allyson Sabal  Reason for Consult: elevated troponin  Patient Profile    Ms. Wisner is a 61 yo female with a PMH significant for pre-diabetes, HTN, Paroxysmal atrial tachycardia, hyperlipidemia, pancreatitis, and arthritis. She presented to [facility] ED with complaints of headache and was found to be in hypertensive urgency.   LYDIANN BONIFAS is a 61 y.o. female who is being seen today for the evaluation of elevated troponin at the request of Dr. Allyson Sabal.   Past Medical History   Past Medical History:  Diagnosis Date  . Arthritis    "hands" (03/24/2017)  . Colon polyp   . High cholesterol   . Multifocal atrial tachycardia (HCC)   . PAT (paroxysmal atrial tachycardia) ([place])    [name] 03/24/2017  . Peptic ulcer    "when I was a child"  . PONV (postoperative nausea and vomiting)    "bad when they did my wisdom teeth"  . Pre-diabetes   . Seasonal allergies     Past Surgical History:  Procedure Laterality Date  . CERVICAL CERCLAGE  X 2  . COLONOSCOPY W/ BIOPSIES AND POLYPECTOMY    . TONSILLECTOMY    . WISDOM TOOTH EXTRACTION       Allergies  Allergies  Allergen Reactions  . Tessalon [Benzonatate] Anaphylaxis    Per pt, this mixed with Zyrtec made her throat "close up"  . Zyrtec [Cetirizine] Anaphylaxis    Per pt, this mixed with Tessalon made her throat "close up"  . Codeine Nausea Only    Dizziness (also) and nausea is SEVERE  . Contrast Media [Iodinated Diagnostic Agents] Hives    And nausea  . Valium [Diazepam] Nausea Only and Other (See Comments)    Dizziness (also)  . Penicillins Rash    Has patient had a PCN reaction causing immediate rash, facial/tongue/throat swelling, SOB or lightheadedness with hypotension: Yes Has patient had a PCN  reaction causing severe rash involving mucus membranes or skin necrosis: No Has patient had a PCN reaction that required hospitalization No Has patient had a PCN reaction occurring within the last 10 years: No If all of the above answers are "NO", then may proceed with Cephalosporin use.     History of Present Illness    Ms Drum was recently hospitalized on 03/23/17 for evaluation of elevated blood pressure and abnormal EKG that was noted at an urgent care. At that time, EKG showed possible ectopic atrial rhythm with rate in the 80s. Telemetry during that hospitalization showed short runs of atrial tachycardia with up to 11 beats echocardiogram showed left ventricular ejection fraction normal with grade 2 diastolic dysfunction and no valvular abnormalities. She was started on Lopressor 25 mg by mouth twice a day with follow-up in clinic in 7-10 days. She was again hospitalized on 03/27/2017 with hypertension headache and vomiting. She was discharged on 03/30/2017. At that time she was discharged on PO metoprolol for atrial arrhythmia and hypertension.  She again presented to [facility] ED on 03/31/2017 with complaints of headache chest pain and abdominal pain. Her abdominal pain was located in the epigastric region and was associated with nausea and vomiting her abdominal pain is currently resolved. Her chest pain was described as substernal and very mild. CT abdomen/pelvis showed no acute findings. She was found to have elevated troponin  that is now down trending:  0.09 --> 0.08 --> 0.06. EKG with sinus rhythm, anterior Q waves, and LVH.  cardiology was asked to consult for elevated troponin.   On my interview, she states her chest pain is very mild and rates it as a 0.5/10. Chest pain had no associated symptoms. Chest pain was associated with her presenting headache and hypertensive urgency. Her hypertension was originally associated with bronchitis medications given to her at her Urgent Care visit on 03/22/17.  She has had three bouts and three hospitalizations of hypertensive urgency since that time. She normally walks her dog and performs daily activities without chest pain. She denies SOB but repots occassional palpitations. She denies current N/V, dizziness, lightheadedness, and syncope.   She has since been taken off all medications with stable BP. She states she feels well now.   Inpatient Medications    . aspirin  325 mg Oral Daily  . cholecalciferol  1,000 Units Oral Daily  . enoxaparin (LOVENOX) injection  40 mg Subcutaneous Q24H  . insulin aspart  0-5 Units Subcutaneous QHS  . insulin aspart  0-9 Units Subcutaneous TID WC  . multivitamin with minerals  1 tablet Oral Daily  . pantoprazole  40 mg Oral Q1200  . sodium chloride flush  3 mL Intravenous Q12H     Outpatient Medications    Prior to Admission medications   Medication Sig Start Date End Date Taking? Authorizing Provider  cholecalciferol (VITAMIN D) 1000 UNITS tablet Take 1,000 Units by mouth daily.   Yes Historical Provider, MD  Flax OIL Take 1 capsule by mouth 3 (three) times daily with meals.   Yes Historical Provider, MD  metoprolol succinate (TOPROL-XL) 25 MG 24 hr tablet Take 1 tablet (25 mg total) by mouth daily. 03/25/17  Yes Nishant Dhungel, MD  Multiple Vitamin (MULTIVITAMIN WITH MINERALS) TABS tablet Take 1 tablet by mouth daily.   Yes Historical Provider, MD  traMADol (ULTRAM) 50 MG tablet Take 1 tablet (50 mg total) by mouth every 6 (six) hours as needed for moderate pain. 03/30/17  Yes Kshitiz Starla Link, MD     Family History     Family History  Problem Relation Age of Onset  . Dementia Mother   . Diabetes Father   . Heart disease Father   . Hypertension Father   . Cancer Sister     endometrial  . Cancer Maternal Aunt     ovarian    Social History    Social History   Social History  . Marital status: Married    Spouse name: N/A  . Number of children: N/A  . Years of education: N/A   Occupational  History  . Not on file.   Social History Main Topics  . Smoking status: Never Smoker  . Smokeless tobacco: Never Used  . Alcohol use Yes     Comment: 03/24/2017 "might average 1 drink/month"  . Drug use: No  . Sexual activity: Yes   Other Topics Concern  . Not on file   Social History Narrative  . No narrative on file     Review of Systems    General:  No chills, fever, night sweats or weight changes.  Cardiovascular:  No chest pain, dyspnea on exertion, edema, orthopnea, palpitations, paroxysmal nocturnal dyspnea. Dermatological: No rash, lesions/masses Respiratory: No cough, dyspnea Urologic: No hematuria, dysuria Abdominal:   No nausea, vomiting, diarrhea, bright red blood per rectum, melena, or hematemesis Neurologic:  No visual changes, wkns, changes in mental status.  All other systems reviewed and are otherwise negative except as noted above.  Physical Exam    Blood pressure (!) 124/52, pulse 79, temperature 98.6 F (37 C), temperature source Oral, resp. rate 20, height 5\' 3"  (1.6 m), weight 132 lb 14.4 oz (60.3 kg), SpO2 99 %.  General: Pleasant, NAD Psych: Normal affect. Neuro: Alert and oriented X 3. Moves all extremities spontaneously. HEENT: Normal  Neck: Supple without bruits or JVD. Lungs:  Resp regular and unlabored, CTA. Heart: RRR, soft systolic murmur. Abdomen: Soft, non-tender, non-distended, BS + x 4.  Extremities: No clubbing, cyanosis or edema. DP/PT/Radials 2+ and equal bilaterally.  Labs    Troponin [facility] Test)  Recent Labs  03/31/17 1604  TROPIPOC 0.03    Recent Labs  03/31/17 2051 04/01/17 0406 04/01/17 0801  TROPONINI 0.09* 0.08* 0.06*   Lab Results  Component Value Date   WBC 11.2 (H) 04/01/2017   HGB 13.0 04/01/2017   HCT 39.7 04/01/2017   MCV 79.7 04/01/2017   PLT 332 04/01/2017    Recent Labs Lab 03/31/17 1554 04/01/17 0801  NA 136 138  K 3.5 3.7  CL 99* 105  CO2 25 26  BUN 20 16  CREATININE 0.89 0.62    CALCIUM 8.9 8.8*  PROT 6.3*  --   BILITOT 0.7  --   ALKPHOS 68  --   ALT 52  --   AST 55*  --   GLUCOSE 292* 116*   Lab Results  Component Value Date   CHOL 197 03/30/2017   HDL 48 03/30/2017   LDLCALC 103 (H) 03/30/2017   TRIG 232 (H) 03/30/2017   No results found for: [facility]   Radiology Studies    Ct Abdomen Pelvis Wo Contrast  Result Date: 03/31/2017 CLINICAL DATA:  Abdominal pain with vomiting. EXAM: CT ABDOMEN AND PELVIS WITHOUT CONTRAST TECHNIQUE: Multidetector CT imaging of the abdomen and pelvis was performed following the standard protocol without IV contrast. COMPARISON:  03/27/2017 FINDINGS: Lower chest:  Negative Hepatobiliary: No focal liver abnormality.No evidence of biliary obstruction or stone. Pancreas: Resolved peripancreatic edema. 8 mm low-density in the uncinate region which may reflect a cyst or other lesion. Spleen: Unremarkable. Adrenals/Urinary Tract: 2.5 cm right adrenal mass with soft tissue density. No hydronephrosis or stone. Unremarkable bladder. Stomach/Bowel: No noted obstruction or inflammation. Gastric antrum has a normalized appearance. No appendicitis. Vascular/Lymphatic: No acute vascular abnormality. Aortoiliac atherosclerosis. No mass or adenopathy. Reproductive:No pathologic findings. Other: No ascites or pneumoperitoneum. Musculoskeletal: No acute abnormalities. IMPRESSION: 1. No acute finding.  No appendicitis. 2. Resolved peripancreatic edema and possible proximal bowel thickening on recent scan. 3. 2.5 cm right adrenal nodule that is indeterminate. In this patient who is allergic to contrast, recommend MRI follow-up. 4. Possible cyst in the uncinate of the pancreas measuring 8 mm, attention on follow-up. 5.  Aortic Atherosclerosis (ICD10-I70.0). Electronically Signed   By: Monte Fantasia M.D.   On: 03/31/2017 20:18   Ct Abdomen Pelvis Wo Contrast  Result Date: 03/27/2017 CLINICAL DATA:  Vomiting, fever, headache and hypertension. One day  duration. EXAM: CT ABDOMEN AND PELVIS WITHOUT CONTRAST TECHNIQUE: Multidetector CT imaging of the abdomen and pelvis was performed following the standard protocol without IV contrast. COMPARISON:  None. FINDINGS: Lower chest: Normal except for minimal atelectasis/scar. Hepatobiliary: Normal Pancreas: Probable orally pancreatitis with mild edema and swelling in the region the pancreatic head. Spleen: Normal Adrenals/Urinary Tract: Adrenal glands are normal. Kidneys are normal. No cyst, mass, stone or hydronephrosis. Stomach/Bowel: Cannot  rule out antral gastritis with wall thickening. This is not a definite finding. No other intestinal abnormality. Appendix is normal. Vascular/Lymphatic: Aortic atherosclerosis. No aneurysm. IVC is normal. No retroperitoneal adenopathy. Reproductive: Normal Other: No free fluid or air. Musculoskeletal: Normal IMPRESSION: Probable early pancreatitis with mild pancreatic swelling and surrounding edema. Cannot rule out antral gastritis.  This is not a certain finding. Aortic atherosclerosis. Electronically Signed   By: Nelson Chimes M.D.   On: 03/27/2017 18:38   Dg Chest 2 View  Result Date: 03/23/2017 CLINICAL DATA:  Tachycardia. Nausea and vomiting. Headache and cough. EXAM: CHEST  2 VIEW COMPARISON:  None. FINDINGS: Artifact overlies chest. Heart size is normal. Mediastinal shadows are normal. The lungs are clear. No effusions. Minimal spinal curvature. No acute bone finding. IMPRESSION: No active cardiopulmonary disease. Electronically Signed   By: Nelson Chimes M.D.   On: 03/23/2017 20:37   Ct Head Wo Contrast  Result Date: 03/31/2017 CLINICAL DATA:  Migraine headaches EXAM: CT HEAD WITHOUT CONTRAST TECHNIQUE: Contiguous axial images were obtained from the base of the skull through the vertex without intravenous contrast. COMPARISON:  03/27/2017 FINDINGS: Brain: No evidence of acute infarction, hemorrhage, hydrocephalus, extra-axial collection or mass lesion/mass effect.  Vascular: No hyperdense vessel or unexpected calcification. Skull: Normal. Negative for fracture or focal lesion. Sinuses/Orbits: Air-fluid level within the right sphenoid sinus. Other: No acute abnormality noted. IMPRESSION: No acute intracranial abnormality noted. Air-fluid level in the right sphenoid sinus. This may contribute to the patient's headaches. Electronically Signed   By: Inez Catalina M.D.   On: 03/31/2017 16:24   Ct Head Wo Contrast  Result Date: 03/27/2017 CLINICAL DATA:  Headache. Hypertension. Symptoms of 1 day duration. EXAM: CT HEAD WITHOUT CONTRAST TECHNIQUE: Contiguous axial images were obtained from the base of the skull through the vertex without intravenous contrast. COMPARISON:  03/23/2017 FINDINGS: Brain: No evidence of malformation, atrophy, old or acute small or large vessel infarction, mass lesion, hemorrhage, hydrocephalus or extra-axial collection. No evidence of pituitary lesion. Vascular: There is atherosclerotic calcification of the major vessels at the base of the brain. Skull: Normal.  No fracture or focal bone lesion. Sinuses/Orbits: Opacification of the sphenoid sinus which could be a cause of headache. Mild mucosal thickening of the left maxillary sinus. Orbits negative. Other: None significant IMPRESSION: Normal appearance of the brain itself. Sphenoid sinusitis which could be a cause of headache. Electronically Signed   By: Nelson Chimes M.D.   On: 03/27/2017 18:39   Ct Head Wo Contrast  Result Date: 03/23/2017 CLINICAL DATA:  Intermittent severe headache.  Hypertension. EXAM: CT HEAD WITHOUT CONTRAST TECHNIQUE: Contiguous axial images were obtained from the base of the skull through the vertex without intravenous contrast. COMPARISON:  None. FINDINGS: BRAIN: No intraparenchymal hemorrhage, mass effect nor midline shift. The ventricles and sulci are normal. No acute large vascular territory infarcts. No abnormal extra-axial fluid collections. Basal cisterns are patent.  VASCULAR: Unremarkable. SKULL/SOFT TISSUES: No skull fracture. Osteopenia. No significant soft tissue swelling. ORBITS/SINUSES: The included ocular globes and orbital contents are normal.Moderate paranasal sinus mucosal thickening. Mastoid air cells are well aerated. OTHER: None. IMPRESSION: Normal CT HEAD. Moderate paranasal sinusitis. Electronically Signed   By: Elon Alas M.D.   On: 03/23/2017 21:11   Ct Angio Chest/abd/pel For Dissection W And/or Wo Contrast  Result Date: 03/24/2017 CLINICAL DATA:  Acute onset of palpitations and severe headache. High blood pressure. Vomiting. Nausea and dizziness. Initial encounter. EXAM: CT ANGIOGRAPHY CHEST, ABDOMEN AND PELVIS TECHNIQUE: Multidetector CT  imaging through the chest, abdomen and pelvis was performed using the standard protocol during bolus administration of intravenous contrast. Multiplanar reconstructed images and MIPs were obtained and reviewed to evaluate the vascular anatomy. CONTRAST:  100 mL of Isovue 370 IV contrast COMPARISON:  None. FINDINGS: CTA CHEST FINDINGS Cardiovascular: There is no evidence of aortic dissection. There is no evidence of aneurysmal dilatation. Minimal calcification is noted along the aortic arch. The great vessels are grossly unremarkable in appearance. There is no evidence of significant pulmonary embolus. The heart is normal in size. The thoracic aorta is otherwise unremarkable in appearance. Mediastinum/Nodes: The mediastinum is unremarkable in appearance. No mediastinal lymphadenopathy is seen. No pericardial effusion is identified. The visualized portions of the thyroid gland are unremarkable. No axillary lymphadenopathy is seen. Lungs/Pleura: Hazy bilateral lower lobe airspace opacities, right greater than left, raise concern for multifocal pneumonia. There is trace opacification of bronchioles to both lower lung lobes, raising question for mild aspiration. No pleural effusion or pneumothorax is seen. No dominant  mass is identified. Musculoskeletal: No acute osseous abnormalities are identified. The visualized musculature is unremarkable in appearance. Review of the MIP images confirms the above findings. CTA ABDOMEN AND PELVIS FINDINGS VASCULAR Aorta: There is no evidence of aortic dissection. There is no evidence of aneurysmal dilatation. Mild scattered calcification is seen along the abdominal aorta. Celiac: The celiac trunk is unremarkable in appearance. SMA: The superior mesenteric artery appears fully patent. Renals: The renal arteries appear intact bilaterally. IMA: The inferior mesenteric artery is unremarkable in appearance. Inflow: The common, internal and external iliac arteries appear fully patent bilaterally. The common femoral arteries and their proximal branches are unremarkable in appearance. Veins: The visualized venous structures are grossly unremarkable. The inferior vena cava is within normal limits. Review of the MIP images confirms the above findings. NON-VASCULAR Hepatobiliary: The liver is grossly unremarkable in appearance. Trace pericholecystic fluid is noted. The gallbladder is otherwise grossly unremarkable. The common bile duct is distended to 1.0 cm in diameter, raising concern for distal obstruction. Pancreas: The pancreas is within normal limits. Spleen: The spleen is unremarkable in appearance. Adrenals/Urinary Tract: A 2.8 cm right adrenal nodule is noted, not well characterized on this study. The left adrenal gland is unremarkable in appearance. A tiny left renal cyst is suggested. There is no evidence of hydronephrosis. No renal or ureteral stones are identified. No perinephric stranding is seen. Stomach/Bowel: The stomach is unremarkable in appearance. The small bowel is within normal limits. The appendix is normal in caliber, without evidence of appendicitis. The colon is unremarkable in appearance. Lymphatic: No retroperitoneal or pelvic sidewall lymphadenopathy is seen. Reproductive:  The bladder is moderately distended and grossly unremarkable. The uterus is unremarkable in appearance. The ovaries are relatively symmetric. No suspicious adnexal masses are seen. Other: No additional soft tissue abnormalities are seen. Musculoskeletal: No acute osseous abnormalities are identified. The visualized musculature is unremarkable in appearance. Review of the MIP images confirms the above findings. IMPRESSION: 1. No evidence of aortic dissection. No evidence of aneurysmal dilatation. 2. No evidence of significant pulmonary embolus. 3. Dilatation of the common bile duct to 1.0 cm in diameter, with trace pericholecystic fluid. This raises concern for distal obstruction. Would correlate with LFTs. MRCP or ERCP could be considered for further evaluation, as deemed clinically appropriate. 4. Hazy bilateral lower lobe airspace opacities, right greater than left, raise concern for multifocal pneumonia. Trace opacification of bronchioles to both lower lung lobes. Given the patient's recent vomiting, this is concerning for  mild aspiration. 5. **An incidental finding of potential clinical significance has been found. 2.8 cm right adrenal nodule noted. Would correlate with adrenal labs, and consider adrenal protocol MRI or CT for further evaluation, when and as deemed clinically appropriate.** 6. Mild aortic atherosclerosis. 7. Tiny left renal cyst suggested. Electronically Signed   By: Garald Balding M.D.   On: 03/24/2017 01:04   US Abdomen Limited Ruq  Result Date: 03/27/2017 CLINICAL DATA:  Pancreatitis EXAM: US ABDOMEN LIMITED - RIGHT UPPER QUADRANT COMPARISON:  CT from 03/27/2017 FINDINGS: Gallbladder: No gallstones or wall thickening visualized. No sonographic Murphy sign noted by sonographer. Common bile duct: Diameter: 3 mm at the level of hepatic artery. No choledocholithiasis. Liver: No focal lesion identified. Within normal limits in parenchymal echogenicity. IMPRESSION: No acute sonographic  abnormality in the right upper quadrant of the abdomen. Electronically Signed   By: Ashley Royalty M.D.   On: 03/27/2017 23:16    ECG & Cardiac Imaging    EKG 03/31/17: EKG with sinus rhythm  Echocardiogram 03/24/17: Study Conclusions - Procedure narrative: Transthoracic echocardiography. Image   quality was poor. The study was technically difficult, as a   result of poor acoustic windows and poor sound wave transmission. - Left ventricle: The cavity size was normal. Wall thickness was   increased in a pattern of mild LVH. Systolic function was   vigorous. The estimated ejection fraction was in the range of 65%   to 70%. Wall motion was normal; there were no regional wall   motion abnormalities. Features are consistent with a pseudonormal   left ventricular filling pattern, with concomitant abnormal   relaxation and increased filling pressure (grade 2 diastolic   dysfunction). - Left atrium: The atrium was mildly dilated.  Impressions: - Vigorous LV systolic function; mild LVH; grade 2 diastolic   dysfunction; mild LAE.  Assessment & Plan    1. Elevated troponin / chest pain - 0.09 --> 0.08 --> 0.06 - Elevated troponin is likely due to demand ischemia in the presence of hypertensive urgency. EKG without clear signs of ischemia. Chest pain does not sound anginal in nature and was likely associated with this bout of hypertensive urgency. Will discuss further ischemic workup with attending.    2. Hypertensive urgency - upon admission, BP 212/73 and improved with IV hydralazine in the ED - she has since been taken off all medications with normal BP - these three bouts of hypertensive urgency occurred following prednisone and tessalon pearls; however, she has not taken these medications since her first hospitalization. It is unclear why she is having these bouts of hypertensive urgency with headache. Her pressure is now controlled and headache and chest pain have resolved.   3. Atrial  tachycardia - Telemetry now in normal sinus rhythm, no signs of tachycardia - Lopressor has been discontinued by primary team and her pressure and HR remain stable.    4. Grade 2 Diastolic Dysfunction on recent echocardiogram - it is unclear if this is secondary to her episodes of hypertensive urgency. She is not in an exacerbation at this time and is not volume overloaded on exam. Will repeat an echo as an outpatient after hypertension has resolved.    Signed, Ledora Bottcher, PA-C 04/02/2017, 4:14 PM [phone]   I have seen and examined the patient along with Ledora Bottcher, PA-C.  I have reviewed the chart, notes and new data.  I agree with PA's note.  Key new complaints: recurrent episodes of severe symptomatic HTN without  clear precipitant (only the first episode was preceded by steroids) Key examination changes: normal CV exam Key new findings / data: note presence of large adrenal nodule on CT, no renal artery stenosis on CT angio. Echo findings of LVH, mild LA dilation and diastolic dysfunction suggest longstanding HTN, even if episodic. 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 Statistically though, most adrenal nodules are clinically silent "incidentalomas". Would start with serum catecholamines. If abnormal confirm with 24h urine collection for metanephrins and VMA. Abdominal MRI or MIBG scan could be used for anatomical confirmation. Keep off beta blockers. Unopposed beta blockade can worsen the high BP response. The minor increase in troponin is readily explained by the elevated BP crisis and is not consistent with acute coronary insufficiency. Outpatient stress testing is appropriate and probably  sufficient.  Sanda Klein, MD, [place] [phone] 04/02/2017, 5:38 PM

## 2017-04-02 NOTE — Progress Notes (Signed)
Patient was not able to go through MRI of her abdomen and head due to her anxiety. Patient does not want to take anything for anxiety at this moment. Patient wants to discuss it with her MD in the morning.

## 2017-04-02 NOTE — Discharge Summary (Signed)
Physician Discharge Summary  Jill [name] MRN: [id number] DOB/AGE: [birth year] 59 y.o.  PCP: Odette Fraction, MD   Admit date: 03/31/2017 Discharge date: 04/02/2017  Discharge Diagnoses:    Principal Problem:   Hypertensive urgency Active Problems:   Leukocytosis   Headache   Atrial tachycardia, paroxysmal (HCC)   Abdominal pain   Adrenal nodule (HCC)   Pancreatic cyst   Chest pain   Prediabetes    Follow-up recommendations Follow-up with PCP in 3-5 days , including all  additional recommended appointments as below Follow-up CBC, CMP in 3-5 days Needs work up for secondary HTN per cards       Current Discharge Medication List    START taking these medications   Details  aspirin 325 MG tablet Take 1 tablet (325 mg total) by mouth daily. Qty: 30 tablet, Refills: 1    pantoprazole (PROTONIX) 40 MG tablet Take 1 tablet (40 mg total) by mouth daily at 12 noon. Qty: 30 tablet, Refills: 2      CONTINUE these medications which have NOT CHANGED   Details  cholecalciferol (VITAMIN D) 1000 UNITS tablet Take 1,000 Units by mouth daily.    Flax OIL Take 1 capsule by mouth 3 (three) times daily with meals.    Multiple Vitamin (MULTIVITAMIN WITH MINERALS) TABS tablet Take 1 tablet by mouth daily.    traMADol (ULTRAM) 50 MG tablet Take 1 tablet (50 mg total) by mouth every 6 (six) hours as needed for moderate pain. Qty: 20 tablet, Refills: 0      STOP taking these medications     metoprolol succinate (TOPROL-XL) 25 MG 24 hr tablet          Discharge Condition: stable  Discharge Instructions Get Medicines reviewed and adjusted: Please take all your medications with you for your next visit with your Primary MD  Please request your Primary MD to go over all hospital tests and procedure/radiological results at the follow up, please ask your Primary MD to get all Hospital records sent to his/her office.  If you experience worsening of your admission symptoms,  develop shortness of breath, life threatening emergency, suicidal or homicidal thoughts you must seek medical attention immediately by calling 911 or calling your MD immediately if symptoms less severe.  You must read complete instructions/literature along with all the possible adverse reactions/side effects for all the Medicines you take and that have been prescribed to you. Take any new Medicines after you have completely understood and accpet all the possible adverse reactions/side effects.   Do not drive when taking Pain medications.   Do not take more than prescribed Pain, Sleep and Anxiety Medications  Special Instructions: If you have smoked or chewed Tobacco in the last 2 yrs please stop smoking, stop any regular Alcohol and or any Recreational drug use.  Wear Seat belts while driving.  Please note  You were cared for by a hospitalist during your hospital stay. Once you are discharged, your primary care physician will handle any further medical issues. Please note that NO REFILLS for any discharge medications will be authorized once you are discharged, as it is imperative that you return to your primary care physician (or establish a relationship with a primary care physician if you do not have one) for your aftercare needs so that they can reassess your need for medications and monitor your lab values.     Allergies  Allergen Reactions  . Tessalon [Benzonatate] Anaphylaxis    Per pt, this mixed with Zyrtec  made her throat "close up"  . Zyrtec [Cetirizine] Anaphylaxis    Per pt, this mixed with Tessalon made her throat "close up"  . Codeine Nausea Only    Dizziness (also) and nausea is SEVERE  . Contrast Media [Iodinated Diagnostic Agents] Hives    And nausea  . Valium [Diazepam] Nausea Only and Other (See Comments)    Dizziness (also)  . Penicillins Rash    Has patient had a PCN reaction causing immediate rash, facial/tongue/throat swelling, SOB or lightheadedness with  hypotension: Yes Has patient had a PCN reaction causing severe rash involving mucus membranes or skin necrosis: No Has patient had a PCN reaction that required hospitalization No Has patient had a PCN reaction occurring within the last 10 years: No If all of the above answers are "NO", then may proceed with Cephalosporin use.       Disposition: 01-Home or Self Care   Consults:  cardiology    Significant Diagnostic Studies:  Ct Abdomen Pelvis Wo Contrast  Result Date: 03/31/2017 CLINICAL DATA:  Abdominal pain with vomiting. EXAM: CT ABDOMEN AND PELVIS WITHOUT CONTRAST TECHNIQUE: Multidetector CT imaging of the abdomen and pelvis was performed following the standard protocol without IV contrast. COMPARISON:  03/27/2017 FINDINGS: Lower chest:  Negative Hepatobiliary: No focal liver abnormality.No evidence of biliary obstruction or stone. Pancreas: Resolved peripancreatic edema. 8 mm low-density in the uncinate region which may reflect a cyst or other lesion. Spleen: Unremarkable. Adrenals/Urinary Tract: 2.5 cm right adrenal mass with soft tissue density. No hydronephrosis or stone. Unremarkable bladder. Stomach/Bowel: No noted obstruction or inflammation. Gastric antrum has a normalized appearance. No appendicitis. Vascular/Lymphatic: No acute vascular abnormality. Aortoiliac atherosclerosis. No mass or adenopathy. Reproductive:No pathologic findings. Other: No ascites or pneumoperitoneum. Musculoskeletal: No acute abnormalities. IMPRESSION: 1. No acute finding.  No appendicitis. 2. Resolved peripancreatic edema and possible proximal bowel thickening on recent scan. 3. 2.5 cm right adrenal nodule that is indeterminate. In this patient who is allergic to contrast, recommend MRI follow-up. 4. Possible cyst in the uncinate of the pancreas measuring 8 mm, attention on follow-up. 5.  Aortic Atherosclerosis (ICD10-I70.0). Electronically Signed   By: Monte Fantasia M.D.   On: 03/31/2017 20:18   Ct  Abdomen Pelvis Wo Contrast  Result Date: 03/27/2017 CLINICAL DATA:  Vomiting, fever, headache and hypertension. One day duration. EXAM: CT ABDOMEN AND PELVIS WITHOUT CONTRAST TECHNIQUE: Multidetector CT imaging of the abdomen and pelvis was performed following the standard protocol without IV contrast. COMPARISON:  None. FINDINGS: Lower chest: Normal except for minimal atelectasis/scar. Hepatobiliary: Normal Pancreas: Probable orally pancreatitis with mild edema and swelling in the region the pancreatic head. Spleen: Normal Adrenals/Urinary Tract: Adrenal glands are normal. Kidneys are normal. No cyst, mass, stone or hydronephrosis. Stomach/Bowel: Cannot rule out antral gastritis with wall thickening. This is not a definite finding. No other intestinal abnormality. Appendix is normal. Vascular/Lymphatic: Aortic atherosclerosis. No aneurysm. IVC is normal. No retroperitoneal adenopathy. Reproductive: Normal Other: No free fluid or air. Musculoskeletal: Normal IMPRESSION: Probable early pancreatitis with mild pancreatic swelling and surrounding edema. Cannot rule out antral gastritis.  This is not a certain finding. Aortic atherosclerosis. Electronically Signed   By: Nelson Chimes M.D.   On: 03/27/2017 18:38   Dg Chest 2 View  Result Date: 03/23/2017 CLINICAL DATA:  Tachycardia. Nausea and vomiting. Headache and cough. EXAM: CHEST  2 VIEW COMPARISON:  None. FINDINGS: Artifact overlies chest. Heart size is normal. Mediastinal shadows are normal. The lungs are clear. No effusions. Minimal spinal curvature.  No acute bone finding. IMPRESSION: No active cardiopulmonary disease. Electronically Signed   By: Nelson Chimes M.D.   On: 03/23/2017 20:37   Ct Head Wo Contrast  Result Date: 03/31/2017 CLINICAL DATA:  Migraine headaches EXAM: CT HEAD WITHOUT CONTRAST TECHNIQUE: Contiguous axial images were obtained from the base of the skull through the vertex without intravenous contrast. COMPARISON:  03/27/2017 FINDINGS:  Brain: No evidence of acute infarction, hemorrhage, hydrocephalus, extra-axial collection or mass lesion/mass effect. Vascular: No hyperdense vessel or unexpected calcification. Skull: Normal. Negative for fracture or focal lesion. Sinuses/Orbits: Air-fluid level within the right sphenoid sinus. Other: No acute abnormality noted. IMPRESSION: No acute intracranial abnormality noted. Air-fluid level in the right sphenoid sinus. This may contribute to the patient's headaches. Electronically Signed   By: Inez Catalina M.D.   On: 03/31/2017 16:24   Ct Head Wo Contrast  Result Date: 03/27/2017 CLINICAL DATA:  Headache. Hypertension. Symptoms of 1 day duration. EXAM: CT HEAD WITHOUT CONTRAST TECHNIQUE: Contiguous axial images were obtained from the base of the skull through the vertex without intravenous contrast. COMPARISON:  03/23/2017 FINDINGS: Brain: No evidence of malformation, atrophy, old or acute small or large vessel infarction, mass lesion, hemorrhage, hydrocephalus or extra-axial collection. No evidence of pituitary lesion. Vascular: There is atherosclerotic calcification of the major vessels at the base of the brain. Skull: Normal.  No fracture or focal bone lesion. Sinuses/Orbits: Opacification of the sphenoid sinus which could be a cause of headache. Mild mucosal thickening of the left maxillary sinus. Orbits negative. Other: None significant IMPRESSION: Normal appearance of the brain itself. Sphenoid sinusitis which could be a cause of headache. Electronically Signed   By: Nelson Chimes M.D.   On: 03/27/2017 18:39   Ct Head Wo Contrast  Result Date: 03/23/2017 CLINICAL DATA:  Intermittent severe headache.  Hypertension. EXAM: CT HEAD WITHOUT CONTRAST TECHNIQUE: Contiguous axial images were obtained from the base of the skull through the vertex without intravenous contrast. COMPARISON:  None. FINDINGS: BRAIN: No intraparenchymal hemorrhage, mass effect nor midline shift. The ventricles and sulci are  normal. No acute large vascular territory infarcts. No abnormal extra-axial fluid collections. Basal cisterns are patent. VASCULAR: Unremarkable. SKULL/SOFT TISSUES: No skull fracture. Osteopenia. No significant soft tissue swelling. ORBITS/SINUSES: The included ocular globes and orbital contents are normal.Moderate paranasal sinus mucosal thickening. Mastoid air cells are well aerated. OTHER: None. IMPRESSION: Normal CT HEAD. Moderate paranasal sinusitis. Electronically Signed   By: Elon Alas M.D.   On: 03/23/2017 21:11   Ct Angio Chest/abd/pel For Dissection W And/or Wo Contrast  Result Date: 03/24/2017 CLINICAL DATA:  Acute onset of palpitations and severe headache. High blood pressure. Vomiting. Nausea and dizziness. Initial encounter. EXAM: CT ANGIOGRAPHY CHEST, ABDOMEN AND PELVIS TECHNIQUE: Multidetector CT imaging through the chest, abdomen and pelvis was performed using the standard protocol during bolus administration of intravenous contrast. Multiplanar reconstructed images and MIPs were obtained and reviewed to evaluate the vascular anatomy. CONTRAST:  100 mL of Isovue 370 IV contrast COMPARISON:  None. FINDINGS: CTA CHEST FINDINGS Cardiovascular: There is no evidence of aortic dissection. There is no evidence of aneurysmal dilatation. Minimal calcification is noted along the aortic arch. The great vessels are grossly unremarkable in appearance. There is no evidence of significant pulmonary embolus. The heart is normal in size. The thoracic aorta is otherwise unremarkable in appearance. Mediastinum/Nodes: The mediastinum is unremarkable in appearance. No mediastinal lymphadenopathy is seen. No pericardial effusion is identified. The visualized portions of the thyroid gland are unremarkable. No  axillary lymphadenopathy is seen. Lungs/Pleura: Hazy bilateral lower lobe airspace opacities, right greater than left, raise concern for multifocal pneumonia. There is trace opacification of bronchioles  to both lower lung lobes, raising question for mild aspiration. No pleural effusion or pneumothorax is seen. No dominant mass is identified. Musculoskeletal: No acute osseous abnormalities are identified. The visualized musculature is unremarkable in appearance. Review of the MIP images confirms the above findings. CTA ABDOMEN AND PELVIS FINDINGS VASCULAR Aorta: There is no evidence of aortic dissection. There is no evidence of aneurysmal dilatation. Mild scattered calcification is seen along the abdominal aorta. Celiac: The celiac trunk is unremarkable in appearance. SMA: The superior mesenteric artery appears fully patent. Renals: The renal arteries appear intact bilaterally. IMA: The inferior mesenteric artery is unremarkable in appearance. Inflow: The common, internal and external iliac arteries appear fully patent bilaterally. The common femoral arteries and their proximal branches are unremarkable in appearance. Veins: The visualized venous structures are grossly unremarkable. The inferior vena cava is within normal limits. Review of the MIP images confirms the above findings. NON-VASCULAR Hepatobiliary: The liver is grossly unremarkable in appearance. Trace pericholecystic fluid is noted. The gallbladder is otherwise grossly unremarkable. The common bile duct is distended to 1.0 cm in diameter, raising concern for distal obstruction. Pancreas: The pancreas is within normal limits. Spleen: The spleen is unremarkable in appearance. Adrenals/Urinary Tract: A 2.8 cm right adrenal nodule is noted, not well characterized on this study. The left adrenal gland is unremarkable in appearance. A tiny left renal cyst is suggested. There is no evidence of hydronephrosis. No renal or ureteral stones are identified. No perinephric stranding is seen. Stomach/Bowel: The stomach is unremarkable in appearance. The small bowel is within normal limits. The appendix is normal in caliber, without evidence of appendicitis. The colon  is unremarkable in appearance. Lymphatic: No retroperitoneal or pelvic sidewall lymphadenopathy is seen. Reproductive: The bladder is moderately distended and grossly unremarkable. The uterus is unremarkable in appearance. The ovaries are relatively symmetric. No suspicious adnexal masses are seen. Other: No additional soft tissue abnormalities are seen. Musculoskeletal: No acute osseous abnormalities are identified. The visualized musculature is unremarkable in appearance. Review of the MIP images confirms the above findings. IMPRESSION: 1. No evidence of aortic dissection. No evidence of aneurysmal dilatation. 2. No evidence of significant pulmonary embolus. 3. Dilatation of the common bile duct to 1.0 cm in diameter, with trace pericholecystic fluid. This raises concern for distal obstruction. Would correlate with LFTs. MRCP or ERCP could be considered for further evaluation, as deemed clinically appropriate. 4. Hazy bilateral lower lobe airspace opacities, right greater than left, raise concern for multifocal pneumonia. Trace opacification of bronchioles to both lower lung lobes. Given the patient's recent vomiting, this is concerning for mild aspiration. 5. **An incidental finding of potential clinical significance has been found. 2.8 cm right adrenal nodule noted. Would correlate with adrenal labs, and consider adrenal protocol MRI or CT for further evaluation, when and as deemed clinically appropriate.** 6. Mild aortic atherosclerosis. 7. Tiny left renal cyst suggested. Electronically Signed   By: Garald Balding M.D.   On: 03/24/2017 01:04   US Abdomen Limited Ruq  Result Date: 03/27/2017 CLINICAL DATA:  Pancreatitis EXAM: US ABDOMEN LIMITED - RIGHT UPPER QUADRANT COMPARISON:  CT from 03/27/2017 FINDINGS: Gallbladder: No gallstones or wall thickening visualized. No sonographic Murphy sign noted by sonographer. Common bile duct: Diameter: 3 mm at the level of hepatic artery. No choledocholithiasis. Liver:  No focal lesion identified. Within normal limits  in parenchymal echogenicity. IMPRESSION: No acute sonographic abnormality in the right upper quadrant of the abdomen. Electronically Signed   By: Ashley Royalty M.D.   On: 03/27/2017 23:16    echocardiogram     Filed Weights   03/31/17 2222  Weight: 60.3 kg (132 lb 14.4 oz)     Microbiology: No results found for this or any previous visit (from the past 240 hour(s)).     Blood Culture No results found for: SDES, [place], CULT, REPTSTATUS    Labs: Results for orders placed or performed during the hospital encounter of 03/31/17 (from the past 48 hour(s))  Rapid urine drug screen (hospital performed)     Status: None   Collection Time: 03/31/17  8:51 PM  Result Value Ref Range   Opiates NONE DETECTED NONE DETECTED   Cocaine NONE DETECTED NONE DETECTED   Benzodiazepines NONE DETECTED NONE DETECTED   Amphetamines NONE DETECTED NONE DETECTED   Tetrahydrocannabinol NONE DETECTED NONE DETECTED   Barbiturates NONE DETECTED NONE DETECTED    Comment:        DRUG SCREEN FOR MEDICAL PURPOSES ONLY.  IF CONFIRMATION IS NEEDED FOR ANY PURPOSE, NOTIFY LAB WITHIN 5 DAYS.        LOWEST DETECTABLE LIMITS FOR URINE DRUG SCREEN Drug Class       Cutoff (ng/mL) Amphetamine      1000 Barbiturate      200 Benzodiazepine   161 Tricyclics       096 Opiates          300 Cocaine          300 THC              50   Troponin I (q 6hr x 3)     Status: Abnormal   Collection Time: 03/31/17  8:51 PM  Result Value Ref Range   Troponin I 0.09 (HH) <0.03 ng/mL    Comment: CRITICAL RESULT CALLED TO, READ BACK BY AND VERIFIED WITH: BOWENS A,RN 03/31/17 2258 WAYK   Glucose, capillary     Status: Abnormal   Collection Time: 03/31/17 10:29 PM  Result Value Ref Range   Glucose-Capillary 194 (H) 65 - 99 mg/dL  Troponin I (q 6hr x 3)     Status: Abnormal   Collection Time: 04/01/17  4:06 AM  Result Value Ref Range   Troponin I 0.08 (HH) <0.03 ng/mL     Comment: CRITICAL VALUE NOTED.  VALUE IS CONSISTENT WITH PREVIOUSLY REPORTED AND CALLED VALUE.  Glucose, capillary     Status: Abnormal   Collection Time: 04/01/17  6:02 AM  Result Value Ref Range   Glucose-Capillary 129 (H) 65 - 99 mg/dL  Troponin I (q 6hr x 3)     Status: Abnormal   Collection Time: 04/01/17  8:01 AM  Result Value Ref Range   Troponin I 0.06 (HH) <0.03 ng/mL    Comment: CRITICAL VALUE NOTED.  VALUE IS CONSISTENT WITH PREVIOUSLY REPORTED AND CALLED VALUE.  Basic metabolic panel     Status: Abnormal   Collection Time: 04/01/17  8:01 AM  Result Value Ref Range   Sodium 138 135 - 145 mmol/L   Potassium 3.7 3.5 - 5.1 mmol/L   Chloride 105 101 - 111 mmol/L   CO2 26 22 - 32 mmol/L   Glucose, Bld 116 (H) 65 - 99 mg/dL   BUN 16 6 - 20 mg/dL   Creatinine, Ser 0.62 0.44 - 1.00 mg/dL   Calcium 8.8 (L) 8.9 - 10.3  mg/dL   GFR calc non Af Amer >60 >60 mL/min   GFR calc Af Amer >60 >60 mL/min    Comment: (NOTE) The eGFR has been calculated using the CKD EPI equation. This calculation has not been validated in all clinical situations. eGFR's persistently <60 mL/min signify possible Chronic Kidney Disease.    Anion gap 7 5 - 15  CBC     Status: Abnormal   Collection Time: 04/01/17  8:01 AM  Result Value Ref Range   WBC 11.2 (H) 4.0 - 10.5 K/uL   RBC 4.98 3.87 - 5.11 MIL/uL   Hemoglobin 13.0 12.0 - 15.0 g/dL   HCT 39.7 36.0 - 46.0 %   MCV 79.7 78.0 - 100.0 fL   MCH 26.1 26.0 - 34.0 pg   MCHC 32.7 30.0 - 36.0 g/dL   RDW 13.4 11.5 - 15.5 %   Platelets 332 150 - 400 K/uL  Lipase, blood     Status: None   Collection Time: 04/01/17  8:01 AM  Result Value Ref Range   Lipase 34 11 - 51 U/L  Glucose, capillary     Status: Abnormal   Collection Time: 04/01/17 11:05 AM  Result Value Ref Range   Glucose-Capillary 126 (H) 65 - 99 mg/dL   Comment 1 Notify RN    Comment 2 Document in Chart   Glucose, capillary     Status: Abnormal   Collection Time: 04/01/17  5:00 PM   Result Value Ref Range   Glucose-Capillary 144 (H) 65 - 99 mg/dL   Comment 1 Notify RN    Comment 2 Document in Chart   Glucose, capillary     Status: Abnormal   Collection Time: 04/01/17  9:06 PM  Result Value Ref Range   Glucose-Capillary 146 (H) 65 - 99 mg/dL   Comment 1 Notify RN    Comment 2 Document in Chart   Glucose, capillary     Status: None   Collection Time: 04/02/17  6:01 AM  Result Value Ref Range   Glucose-Capillary 94 65 - 99 mg/dL   Comment 1 Notify RN   Glucose, capillary     Status: Abnormal   Collection Time: 04/02/17 11:45 AM  Result Value Ref Range   Glucose-Capillary 128 (H) 65 - 99 mg/dL   Comment 1 Notify RN   T4, free     Status: None   Collection Time: 04/02/17 12:01 PM  Result Value Ref Range   Free T4 1.03 0.61 - 1.12 ng/dL    Comment: (NOTE) Biotin ingestion may interfere with free T4 tests. If the results are inconsistent with the TSH level, previous test results, or the clinical presentation, then consider biotin interference. If needed, order repeat testing after stopping biotin.   Glucose, capillary     Status: Abnormal   Collection Time: 04/02/17  4:50 PM  Result Value Ref Range   Glucose-Capillary 122 (H) 65 - 99 mg/dL   Comment 1 Notify RN    Comment 2 Document in Chart      Lipid Panel     Component Value Date/Time   CHOL 197 03/30/2017 0428   TRIG 232 (H) 03/30/2017 0428   HDL 48 03/30/2017 0428   CHOLHDL 4.1 03/30/2017 0428   VLDL 46 (H) 03/30/2017 0428   LDLCALC 103 (H) 03/30/2017 0428     Lab Results  Component Value Date   HGBA1C 6.1 (H) 03/10/2017   HGBA1C 6.1 (H) 10/29/2016       HPI : *  Ms. Blethen is a 62 yo female with a PMH significant for pre-diabetes, HTN, Paroxysmal atrial tachycardia, hyperlipidemia, pancreatitis, and arthritis. She presented to [facility] ED with complaints of headache and was found to be in hypertensive urgency.presented to [facility] ED on 03/31/2017 with complaints of headache chest pain and  abdominal pain. Her abdominal pain was located in the epigastric region and was associated with nausea and vomiting her abdominal pain is currently resolved. Her chest pain was described as substernal and very mild. CT abdomen/pelvis showed no acute findings. She was found to have elevated troponin that is now down trending:  0.09 --> 0.08 --> 0.06. EKG with sinus rhythm, anterior Q waves, and LVH.  cardiology was asked to consult for elevated troponin.   HOSPITAL COURSE:  1. Elevated troponin / chest pain - 0.09 --> 0.08 --> 0.06 - Elevated troponin is likely due to demand ischemia in the presence of hypertensive urgency. EKG without clear signs of ischemia. Chest pain does not sound anginal in nature and was likely associated with this bout of hypertensive urgency. Will discuss further ischemic workup with attending.    2. Hypertensive urgency - upon admission, BP 212/73 and improved with IV hydralazine in the ED - she has since been taken off all medications with normal BP - these three bouts of hypertensive urgency occurred following prednisone and tessalon pearls; however, she has not taken these medications since her first hospitalization. It is unclear why she is having these bouts of hypertensive urgency with headache. severely symptomatic paroxysmal HTN together with the presence of an adrenal nodule suggests possible pheochromocytoma.serum catecholamines. If abnormal confirm with 24h urine collection for metanephrins and VMA. Abdominal MRI or MIBG scan could be used for anatomical confirmation. Keep off beta blockers. Unopposed beta blockade can worsen the high BP response   3. Atrial tachycardia - Telemetry now in normal sinus rhythm, no signs of tachycardia - Lopressor has been discontinued   and her pressure and HR remain stable.    4. Grade 2 Diastolic Dysfunction on recent echocardiogram - it is unclear if this is secondary to her episodes of hypertensive urgency. She is not  in an exacerbation at this time and is not volume overloaded on exam. Will repeat an echo as an outpatient after hypertension has resolved.        Blood pressure (!) 124/52, pulse 79, temperature 98.6 F (37 C), temperature source Oral, resp. rate 20, height 5' 3"  (1.6 m), weight 60.3 kg (132 lb 14.4 oz), SpO2 99 %.  General: Pleasant, NAD Psych: Normal affect. Neuro: Alert and oriented X 3. Moves all extremities spontaneously. HEENT: Normal           Neck: Supple without bruits or JVD. Lungs:  Resp regular and unlabored, CTA. Heart: RRR, soft systolic murmur. Abdomen: Soft, non-tender, non-distended, BS + x 4.  Extremities: No clubbing, cyanosis or edema. DP/PT/Radials 2+ and equal bilaterally.    Follow-up Information    [facility] TOM, MD. Call.   Specialty:  Family Medicine Why:  hospital follow up in 3-5 days  Contact information: [place] [address] [place] [id number]           Signed: Reyne Dumas 04/02/2017, 5:22 PM        Time spent >45 mins

## 2017-04-02 NOTE — Progress Notes (Addendum)
PROGRESS NOTE    Jill [name]  JHE:[id number] DOB: 06-Oct-1956 DOA: 03/31/2017 PCP: Odette Fraction, MD   Outpatient Specialists:     Brief Narrative:  Jill [name] is a 61 y.o. female with medical history significant of prediabetes, recently diagnosed hypertension, hyperlipidemia, peptic ulcer disease, arthritis, paroxysmal atrial tachycardia, who presents with headache, chest pain and abdominal pain.  Pt was recently hospitalized due to acute pancreatitis from 4/27-4/30/18. Pt had RUQ USon 4/27 which was negative for cholelithiasis. Etiology was not clear for pancreatitis. Patient was also hospitalized from 4/23-4/24 and found to have hypertensive urgency. Patient did not have history of hypertension before admission.   Patient was seen by her PCP and BP was high and this was attributed to anxiety  Patient currently has multiple complaints including chest pain, numbness and tingling down right arm as well as BP was elevated to > 200 upon admission.  Assessment & Plan:   Principal Problem:   Hypertensive urgency Active Problems:   Leukocytosis   Headache   Atrial tachycardia, paroxysmal (HCC)   Abdominal pain   Adrenal nodule (HCC)   Pancreatic cyst   Chest pain   Prediabetes   Hypertensive urgency: Initially patient had elevated blood pressure at 212/73, which has improved  after treated with IV hydralazine 5 mg 2 in ED and metoprolol UDS negative -h/o MFAT- is following up with Dr. Meda Coffee, wants to dc  metoprolol, feels most of her side effects are related to medications She has not received metoprolol since 5/1-telemetry has been stable    Right arm numbness No focal deficits, MRI of the brain discontinued -orthostatics  were positive on 5/2, will repeat   Headache:  Most likely due to hypertensive urgency. Headache has resolved currently. -Blood pressure is better controlled now   Chest pain with mildly elevaetd troponin, now trending down -demand ischemia  secondary to hypertensive urgency.  Patient had A1c 6.1 on 03/10/17 and LDL 103 on 03/30/17.  - start ASA-->will also start protonix given hx of PUD. - 2d echo 4/24-no wall motion abnormalities, EF 65-70%, Cardiology evaluation to see patient needs inpatient stress test  Atrial tachycardia, paroxysmal:none seen since 5/1 Dc metoprolol as HR is stable , patient also refusing metoprolol,   Prediabetes: A1c 6.1 on 03/10/17 -SSI  Abdominal pain: Recent admission for acute pancreatitis. Dilated CBD on CT, also  CT scan showed resolved peripancreatic edema. Her abdominal pain has resolved currently.  Leukocytosis: no signs of infection. No respiratory symptoms. Patient denies symptoms of UTI. Likely due to stress induced to demargination. -follow up by CBC  Adrenal nodule: CT-scan showed 2.8 cm right adrenal nodule. Outpatient MRI of the abdomen recommended -Dr. Blaine Hamper ordered plasma metanephrine pending  Pancreatic cyst: CT scan showed possible cyst in the uncinate of the pancreas measuring 8 mm -need to f/u with PCP, MRI of the abdomen    DVT prophylaxis:  Lovenox   Code Status: Full Code   Family Communication:  No family by the bedside   Disposition Plan: Anticipate discharge tomorrow if cardiology has cleared her for discharge    Consultants:   Cardiology   Subjective: Feels much better , no nausea , no vomitting   Objective: Vitals:   04/01/17 1324 04/01/17 2033 04/02/17 0445 04/02/17 0943  BP:  (!) 120/49 (!) 107/54 (!) 124/52  Pulse: (!) 54 68 63 79  Resp: 18 16 18    Temp: 98.1 F (36.7 C) 98.7 F (37.1 C) 98.2 F (36.8 C)   TempSrc: Oral  Oral Oral   SpO2: 99% 98% 99%   Weight:      Height:        Intake/Output Summary (Last 24 hours) at 04/02/17 0945 Last data filed at 04/01/17 1800  Gross per 24 hour  Intake              290 ml  Output                0 ml  Net              290 ml   Filed Weights   03/31/17 2222  Weight: 60.3 kg (132 lb 14.4  oz)    Examination:  General exam: Appears calm and comfortable  Respiratory system: Clear to auscultation. Respiratory effort normal. Cardiovascular system: S1 & S2 heard, RRR. No JVD, murmurs, rubs, gallops or clicks. No pedal edema. Gastrointestinal system: Abdomen is nondistended, soft and nontender. No organomegaly or masses felt. Normal bowel sounds heard. Central nervous system: Alert and oriented. No focal neurological deficits. Extremities: Symmetric 5 x 5 power. Skin: No rashes, lesions or ulcers Psychiatry: Judgement and insight appear normal. Mood & affect appropriate.     Data Reviewed: I have personally reviewed following labs and imaging studies  CBC:  Recent Labs Lab 03/26/17 0957  03/28/17 0021 03/29/17 0404 03/30/17 0428 03/31/17 1554 04/01/17 0801  WBC 12.4*  < > 11.4* 8.6 7.9 15.9* 11.2*  NEUTROABS 10,292*  --   --   --   --  12.9*  --   HGB 11.9*  < > 12.7 12.5 12.0 14.5 13.0  HCT 36.3  < > 37.8 38.4 36.8 42.9 39.7  MCV 81.4  < > 79.2 80.2 80.0 79.2 79.7  PLT 369  < > 281 267 262 393 332  < > = values in this interval not displayed. Basic Metabolic Panel:  Recent Labs Lab 03/28/17 0021 03/29/17 0404 03/30/17 0428 03/31/17 1554 04/01/17 0801  NA 137 140 139 136 138  K 3.2* 4.1 3.2* 3.5 3.7  CL 102 105 104 99* 105  CO2 26 28 29 25 26   GLUCOSE 153* 125* 107* 292* 116*  BUN 13 6 7 20 16   CREATININE 0.71 0.63 0.63 0.89 0.62  CALCIUM 8.0* 8.7* 8.6* 8.9 8.8*  MG 1.9  --   --  2.3  --    GFR: Estimated Creatinine Clearance: 62.6 mL/min (by C-G formula based on SCr of 0.62 mg/dL). Liver Function Tests:  Recent Labs Lab 03/27/17 1327 03/28/17 0021 03/29/17 0404 03/30/17 0428 03/31/17 1554  AST 36 20 16 14* 55*  ALT 60* 40 31 25 52  ALKPHOS 71 61 54 51 68  BILITOT 0.5 0.4 0.6 0.4 0.7  PROT 5.9* 5.0* 5.1* 5.0* 6.3*  ALBUMIN 3.6 2.8* 2.6* 2.8* 3.4*    Recent Labs Lab 03/27/17 1327 03/29/17 0404 03/31/17 1554  LIPASE 113* 24 37    No results for input(s): AMMONIA in the last 168 hours. Coagulation Profile: No results for input(s): INR, PROTIME in the last 168 hours. Cardiac Enzymes:  Recent Labs Lab 03/31/17 2051 04/01/17 0406 04/01/17 0801  TROPONINI 0.09* 0.08* 0.06*   BNP (last 3 results) No results for input(s): PROBNP in the last 8760 hours. HbA1C: No results for input(s): HGBA1C in the last 72 hours. CBG:  Recent Labs Lab 04/01/17 0602 04/01/17 1105 04/01/17 1700 04/01/17 2106 04/02/17 0601  GLUCAP 129* 126* 144* 146* 94   Lipid Profile: No results for input(s): CHOL, HDL, LDLCALC, TRIG,  CHOLHDL, LDLDIRECT in the last 72 hours. Thyroid Function Tests: No results for input(s): TSH, T4TOTAL, FREET4, T3FREE, THYROIDAB in the last 72 hours. Anemia Panel: No results for input(s): VITAMINB12, FOLATE, FERRITIN, TIBC, IRON, RETICCTPCT in the last 72 hours. Urine analysis:    Component Value Date/Time   COLORURINE YELLOW 03/31/2017 [place] 03/31/2017 1527   LABSPEC 1.018 03/31/2017 1527   PHURINE 5.0 03/31/2017 1527   GLUCOSEU 150 (A) 03/31/2017 1527   HGBUR NEGATIVE 03/31/2017 [place] 03/31/2017 1527   [address] 03/31/2017 1527   PROTEINUR NEGATIVE 03/31/2017 1527   NITRITE NEGATIVE 03/31/2017 1527   LEUKOCYTESUR NEGATIVE 03/31/2017 1527     )No results found for this or any previous visit (from the past 240 hour(s)).    Anti-infectives    None       Radiology Studies: Ct Abdomen Pelvis Wo Contrast  Result Date: 03/31/2017 CLINICAL DATA:  Abdominal pain with vomiting. EXAM: CT ABDOMEN AND PELVIS WITHOUT CONTRAST TECHNIQUE: Multidetector CT imaging of the abdomen and pelvis was performed following the standard protocol without IV contrast. COMPARISON:  03/27/2017 FINDINGS: Lower chest:  Negative Hepatobiliary: No focal liver abnormality.No evidence of biliary obstruction or stone. Pancreas: Resolved peripancreatic edema. 8 mm  low-density in the uncinate region which may reflect a cyst or other lesion. Spleen: Unremarkable. Adrenals/Urinary Tract: 2.5 cm right adrenal mass with soft tissue density. No hydronephrosis or stone. Unremarkable bladder. Stomach/Bowel: No noted obstruction or inflammation. Gastric antrum has a normalized appearance. No appendicitis. Vascular/Lymphatic: No acute vascular abnormality. Aortoiliac atherosclerosis. No mass or adenopathy. Reproductive:No pathologic findings. Other: No ascites or pneumoperitoneum. Musculoskeletal: No acute abnormalities. IMPRESSION: 1. No acute finding.  No appendicitis. 2. Resolved peripancreatic edema and possible proximal bowel thickening on recent scan. 3. 2.5 cm right adrenal nodule that is indeterminate. In this patient who is allergic to contrast, recommend MRI follow-up. 4. Possible cyst in the uncinate of the pancreas measuring 8 mm, attention on follow-up. 5.  Aortic Atherosclerosis (ICD10-I70.0). Electronically Signed   By: Monte Fantasia M.D.   On: 03/31/2017 20:18   Ct Head Wo Contrast  Result Date: 03/31/2017 CLINICAL DATA:  Migraine headaches EXAM: CT HEAD WITHOUT CONTRAST TECHNIQUE: Contiguous axial images were obtained from the base of the skull through the vertex without intravenous contrast. COMPARISON:  03/27/2017 FINDINGS: Brain: No evidence of acute infarction, hemorrhage, hydrocephalus, extra-axial collection or mass lesion/mass effect. Vascular: No hyperdense vessel or unexpected calcification. Skull: Normal. Negative for fracture or focal lesion. Sinuses/Orbits: Air-fluid level within the right sphenoid sinus. Other: No acute abnormality noted. IMPRESSION: No acute intracranial abnormality noted. Air-fluid level in the right sphenoid sinus. This may contribute to the patient's headaches. Electronically Signed   By: Inez Catalina M.D.   On: 03/31/2017 16:24        Scheduled Meds: . aspirin  325 mg Oral Daily  . cholecalciferol  1,000 Units Oral Daily   . enoxaparin (LOVENOX) injection  40 mg Subcutaneous Q24H  . insulin aspart  0-5 Units Subcutaneous QHS  . insulin aspart  0-9 Units Subcutaneous TID WC  . metoprolol succinate  25 mg Oral Daily  . multivitamin with minerals  1 tablet Oral Daily  . pantoprazole  40 mg Oral Q1200  . sodium chloride flush  3 mL Intravenous Q12H   Continuous Infusions: . sodium chloride 75 mL/hr at 04/01/17 1720     LOS: 0 days    Time spent: 25 min    Aydyn Testerman,  MD [facility] Pager [phone]  If 7PM-7AM, please contact night-coverage [email or web address] Password [email or web address] 04/02/2017, 9:45 AM

## 2017-04-03 LAB — METANEPHRINES, PLASMA
METANEPHRINE FREE: 571 pg/mL — AB (ref 0–62)
NORMETANEPHRINE FREE: 377 pg/mL — AB (ref 0–145)

## 2017-04-06 ENCOUNTER — Other Ambulatory Visit: Payer: Self-pay | Admitting: Family Medicine

## 2017-04-06 ENCOUNTER — Ambulatory Visit (INDEPENDENT_AMBULATORY_CARE_PROVIDER_SITE_OTHER): Payer: BC Managed Care – PPO | Admitting: Family Medicine

## 2017-04-06 ENCOUNTER — Encounter: Payer: Self-pay | Admitting: Family Medicine

## 2017-04-06 ENCOUNTER — Telehealth: Payer: Self-pay | Admitting: Family Medicine

## 2017-04-06 VITALS — BP 130/70 | HR 80 | Temp 98.8°F | Resp 16 | Ht 63.0 in | Wt 132.0 lb

## 2017-04-06 DIAGNOSIS — D3501 Benign neoplasm of right adrenal gland: Secondary | ICD-10-CM | POA: Diagnosis not present

## 2017-04-06 DIAGNOSIS — E278 Other specified disorders of adrenal gland: Secondary | ICD-10-CM

## 2017-04-06 DIAGNOSIS — Z09 Encounter for follow-up examination after completed treatment for conditions other than malignant neoplasm: Secondary | ICD-10-CM

## 2017-04-06 DIAGNOSIS — I10 Essential (primary) hypertension: Secondary | ICD-10-CM

## 2017-04-06 MED ORDER — DOXAZOSIN MESYLATE 1 MG PO TABS: 1.0000 mg | ORAL_TABLET | Freq: Every day | ORAL | 5 refills | Status: DC

## 2017-04-06 NOTE — Telephone Encounter (Signed)
-----   Message from Susy Frizzle, MD sent at 04/06/2017  7:00 AM EDT ----- NTBS today.  Urine test looks like she may have pheochromocytoma.  Needs general surgery consult asap.  Must start her on meds prior to surgery.  Would like to discuss.

## 2017-04-06 NOTE — Telephone Encounter (Signed)
Referral to [facility] placed

## 2017-04-06 NOTE — Progress Notes (Signed)
Subjective:    Patient ID: Jill [name], female    DOB: 08-Apr-1956, 61 y.o.   MRN: [id number]  HPI Please see the hospital records from last week. I have reviewed them at length. The patient has been admitted and readmitted to the hospital several times with the last few weeks for tachycardia, episodic elevations in her blood pressure and hypertensive urgency, and headache. Originally diagnosed with multifocal atrial tachycardia, she was started on a beta blocker changed drastically exacerbated the situation. At her most recent hospital visit, she was found to have a 2.5 cm mass on her right adrenal gland. Plasma metanephrines were obtained. Metanephrine level was over 500. Normetanephrine level was over 370. Both levels are markedly elevated. History and physical exam findings and studies are concerning for possible pheochromocytoma. Patient feels better since stopping the beta blocker. She is not on any alpha blocker. Past Medical History:  Diagnosis Date  . Arthritis    "hands" (03/24/2017)  . Colon polyp   . High cholesterol   . Multifocal atrial tachycardia (HCC)   . PAT (paroxysmal atrial tachycardia) ([place])    [name] 03/24/2017  . Peptic ulcer    "when I was a child"  . PONV (postoperative nausea and vomiting)    "bad when they did my wisdom teeth"  . Pre-diabetes   . Seasonal allergies    Past Surgical History:  Procedure Laterality Date  . CERVICAL CERCLAGE  X 2  . COLONOSCOPY W/ BIOPSIES AND POLYPECTOMY    . TONSILLECTOMY    . WISDOM TOOTH EXTRACTION     Current Outpatient Prescriptions on File Prior to Visit  Medication Sig Dispense Refill  . cholecalciferol (VITAMIN D) 1000 UNITS tablet Take 1,000 Units by mouth daily.    . Flax OIL Take 1 capsule by mouth 3 (three) times daily with meals.    . Multiple Vitamin (MULTIVITAMIN WITH MINERALS) TABS tablet Take 1 tablet by mouth daily.    . pantoprazole (PROTONIX) 40 MG tablet Take 1 tablet (40 mg total) by mouth daily at 12  noon. (Patient not taking: Reported on 04/06/2017) 30 tablet 2  . traMADol (ULTRAM) 50 MG tablet Take 1 tablet (50 mg total) by mouth every 6 (six) hours as needed for moderate pain. (Patient not taking: Reported on 04/06/2017) 20 tablet 0   No current facility-administered medications on file prior to visit.    Allergies  Allergen Reactions  . Tessalon [Benzonatate] Anaphylaxis    Per pt, this mixed with Zyrtec made her throat "close up"  . Zyrtec [Cetirizine] Anaphylaxis    Per pt, this mixed with Tessalon made her throat "close up"  . Codeine Nausea Only    Dizziness (also) and nausea is SEVERE  . Contrast Media [Iodinated Diagnostic Agents] Hives    And nausea  . Valium [Diazepam] Nausea Only and Other (See Comments)    Dizziness (also)  . Penicillins Rash    Has patient had a PCN reaction causing immediate rash, facial/tongue/throat swelling, SOB or lightheadedness with hypotension: Yes Has patient had a PCN reaction causing severe rash involving mucus membranes or skin necrosis: No Has patient had a PCN reaction that required hospitalization No Has patient had a PCN reaction occurring within the last 10 years: No If all of the above answers are "NO", then may proceed with Cephalosporin use.    Social History   Social History  . Marital status: Married    Spouse name: N/A  . Number of children: N/A  .  Years of education: N/A   Occupational History  . Not on file.   Social History Main Topics  . Smoking status: Never Smoker  . Smokeless tobacco: Never Used  . Alcohol use Yes     Comment: 03/24/2017 "might average 1 drink/month"  . Drug use: No  . Sexual activity: Yes   Other Topics Concern  . Not on file   Social History Narrative  . No narrative on file     Review of Systems  All other systems reviewed and are negative.      Objective:   Physical Exam  Constitutional: She is oriented to person, place, and time. She appears well-developed and well-nourished.  No distress.  Neck: Neck supple. No JVD present. No thyromegaly present.  Cardiovascular: Normal rate, regular rhythm, normal heart sounds and intact distal pulses.  Exam reveals no gallop and no friction rub.   No murmur heard. Pulmonary/Chest: Effort normal and breath sounds normal. No respiratory distress. She has no wheezes. She has no rales. She exhibits no tenderness.  Abdominal: Soft. Bowel sounds are normal. She exhibits no distension and no mass. There is no tenderness. There is no rebound and no guarding.  Lymphadenopathy:    She has no cervical adenopathy.  Neurological: She is alert and oriented to person, place, and time. She has normal reflexes. She displays normal reflexes. No cranial nerve deficit. She exhibits normal muscle tone. Coordination normal.  Skin: She is not diaphoretic.  Vitals reviewed.         Assessment & Plan:  Hospital discharge follow-up  Pheochromocytoma of right adrenal gland  We'll start the patient on an alpha-blocker. Begin doxazosin 1 mg by mouth daily. Monitor the patient over the next week. May need to increase alpha blockade if she experiences exacerbations with tachycardia and elevated blood pressure. Consult general surgery to discuss possible removal of her right adrenal gland. Obtain 24-hour urine collection for metanephrines and catecholamines as a confirmation prior to surgery. Also consult endocrinology to determine if there is any other follow-up or evaluation that needs to take place given my lack of experience with this situation such as evaluation for MEN etc.  He has an appointment already to see general surgery, in 2 weeks.

## 2017-04-07 ENCOUNTER — Other Ambulatory Visit: Payer: Self-pay

## 2017-04-07 ENCOUNTER — Other Ambulatory Visit: Payer: BC Managed Care – PPO

## 2017-04-07 ENCOUNTER — Telehealth: Payer: Self-pay | Admitting: Family Medicine

## 2017-04-07 ENCOUNTER — Ambulatory Visit: Payer: BC Managed Care – PPO | Admitting: Physician Assistant

## 2017-04-07 DIAGNOSIS — I10 Essential (primary) hypertension: Secondary | ICD-10-CM

## 2017-04-07 DIAGNOSIS — E278 Other specified disorders of adrenal gland: Secondary | ICD-10-CM

## 2017-04-07 NOTE — Telephone Encounter (Signed)
Patient calling to ask question about if she still needs to be checking her blood sugar  Please call her at [phone]

## 2017-04-07 NOTE — Telephone Encounter (Signed)
Yes check fasting and 2 hr post prandial.

## 2017-04-08 NOTE — Telephone Encounter (Signed)
Patient aware of results and of providers recommendations via vm

## 2017-04-10 ENCOUNTER — Inpatient Hospital Stay: Payer: BC Managed Care – PPO | Admitting: Family Medicine

## 2017-04-11 ENCOUNTER — Other Ambulatory Visit: Payer: Self-pay | Admitting: Family Medicine

## 2017-04-13 ENCOUNTER — Other Ambulatory Visit: Payer: BC Managed Care – PPO

## 2017-04-16 ENCOUNTER — Ambulatory Visit: Payer: BC Managed Care – PPO | Admitting: Physician Assistant

## 2017-04-16 ENCOUNTER — Telehealth: Payer: Self-pay | Admitting: Family Medicine

## 2017-04-16 MED ORDER — LANCETS MISC: 3 refills | Status: DC

## 2017-04-16 MED ORDER — GLUCOSE BLOOD VI STRP: ORAL_STRIP | 5 refills | Status: DC

## 2017-04-16 NOTE — Telephone Encounter (Signed)
LMOVM to call back with type of meter and lancet device she has

## 2017-04-16 NOTE — Telephone Encounter (Signed)
Pt uses Accu-chek Guide and Fast clix lancets - rx sent to pharm.

## 2017-04-16 NOTE — Telephone Encounter (Signed)
Pt needs refill on lancets and test strips accucheck sent to [facility] at [address].

## 2017-04-21 LAB — CATECHOLAMINES, FRACTIONATED, URINE, 24 HOUR
CALCULATED TOTAL (E+ NE): 196 ug/(24.h) — AB (ref 26–121)
CREATININE, URINE MG/DAY-CATEUR: 1.2 g/(24.h) (ref 0.63–2.50)
DOPAMINE, 24 HR URINE: 218 ug/(24.h) (ref 52–480)
EPINEPHRINE, 24 HR URINE: 119 ug/(24.h) — AB (ref 2–24)
NOREPINEPHRINE, 24 HR UR: 77 ug/(24.h) (ref 15–100)
TOTAL VOLUME - CF 24HR U: 1100 mL

## 2017-04-22 ENCOUNTER — Other Ambulatory Visit: Payer: Self-pay | Admitting: Family Medicine

## 2017-04-23 ENCOUNTER — Other Ambulatory Visit: Payer: Self-pay | Admitting: Family Medicine

## 2017-04-23 MED ORDER — GLUCOSE BLOOD VI STRP: ORAL_STRIP | 5 refills | Status: DC

## 2017-04-24 ENCOUNTER — Other Ambulatory Visit: Payer: Self-pay | Admitting: General Surgery

## 2017-04-24 DIAGNOSIS — D3501 Benign neoplasm of right adrenal gland: Secondary | ICD-10-CM

## 2017-04-28 LAB — METANEPHRINES, URINE, 24 HOUR
Metaneph Total, Ur: 1932 mcg/24 h — ABNORMAL HIGH (ref 224–832)
Metanephrines, Ur: 1420 mcg/24 h — ABNORMAL HIGH (ref 90–315)
Normetanephrine, 24H Ur: 512 mcg/24 h (ref 122–676)

## 2017-04-29 ENCOUNTER — Encounter: Payer: Self-pay | Admitting: Endocrinology

## 2017-04-29 ENCOUNTER — Ambulatory Visit (INDEPENDENT_AMBULATORY_CARE_PROVIDER_SITE_OTHER): Payer: BC Managed Care – PPO | Admitting: Endocrinology

## 2017-04-29 ENCOUNTER — Other Ambulatory Visit: Payer: Self-pay | Admitting: *Deleted

## 2017-04-29 ENCOUNTER — Telehealth: Payer: Self-pay | Admitting: *Deleted

## 2017-04-29 VITALS — BP 126/70 | HR 85 | Ht 63.0 in | Wt 134.0 lb

## 2017-04-29 DIAGNOSIS — E279 Disorder of adrenal gland, unspecified: Secondary | ICD-10-CM

## 2017-04-29 DIAGNOSIS — E278 Other specified disorders of adrenal gland: Secondary | ICD-10-CM

## 2017-04-29 NOTE — Telephone Encounter (Signed)
Patient would like to be referred to Dr. Everardo Beals at [facility] to evaluate the pheochromocytoma of rt adrenal gland. Surgeon she went to was not comfortable removing this so would like to go to [facility]. Contact person at Dr. Melina Modena office is Jannifer Rodney [phone]. Will call this office and schedule as soon as possible. Dr. Antony Contras nurse was familiar with this situation and gave approval for this action.

## 2017-04-29 NOTE — Progress Notes (Addendum)
Subjective:    Patient ID: Jill [name], female    DOB: November 16, 1956, 61 y.o.   MRN: [id number]  HPI Pt is referred by Dr. Dennard [name], for possible pheochromocytoma.  she has found to have HTN 1 month ago.  He has no prior h/o adrenal disease, diabetes (except for GDM), migraine headache, MTC, alcohol withdrawal, neurofibromas, hemangiomas, brain aneurysm, cocaine use, or thyroid disease.  She reports 1 year of intermittent moderate palpitations in the chest, and assoc excessive diaphoresis.  She was also found to have cbg of 430, after a dose of prednisone.  She had an episode of severe HTN, when she was on toprol alone.  Since on cardura, she says BP has been well-controlled at home.  she brings a record of her cbg's which I have reviewed today.  She was ref to Psychologist, sport and exercise at [facility], and is awaiting appt date.  Past Medical History:  Diagnosis Date  . Arthritis    "hands" (03/24/2017)  . Colon polyp   . High cholesterol   . Multifocal atrial tachycardia (HCC)   . PAT (paroxysmal atrial tachycardia) ([place])    Jill [name] 03/24/2017  . Peptic ulcer    "when I was a child"  . PONV (postoperative nausea and vomiting)    "bad when they did my wisdom teeth"  . Pre-diabetes   . Seasonal allergies     Past Surgical History:  Procedure Laterality Date  . CERVICAL CERCLAGE  X 2  . COLONOSCOPY W/ BIOPSIES AND POLYPECTOMY    . TONSILLECTOMY    . WISDOM TOOTH EXTRACTION      Social History   Social History  . Marital status: Married    Spouse name: N/A  . Number of children: N/A  . Years of education: N/A   Occupational History  . Not on file.   Social History Main Topics  . Smoking status: Never Smoker  . Smokeless tobacco: Never Used  . Alcohol use Yes     Comment: 03/24/2017 "might average 1 drink/month"  . Drug use: No  . Sexual activity: Yes   Other Topics Concern  . Not on file   Social History Narrative  . No narrative on file    Current Outpatient Prescriptions on File Prior to  Visit  Medication Sig Dispense Refill  . cholecalciferol (VITAMIN D) 1000 UNITS tablet Take 1,000 Units by mouth daily.    Marland Kitchen doxazosin (CARDURA) 1 MG tablet Take 1 tablet (1 mg total) by mouth daily. 30 tablet 5  . Flax OIL Take 1 capsule by mouth 3 (three) times daily with meals.    Marland Kitchen glucose blood test strip Check BS QAM & 2 hours after supper 100 each 5  . Lancets MISC Fast Clix Lancets 100 each 3  . Multiple Vitamin (MULTIVITAMIN WITH MINERALS) TABS tablet Take 1 tablet by mouth daily.     No current facility-administered medications on file prior to visit.     Allergies  Allergen Reactions  . Tessalon [Benzonatate] Anaphylaxis    Per pt, this mixed with Zyrtec made her throat "close up"  . Zyrtec [Cetirizine] Anaphylaxis    Per pt, this mixed with Tessalon made her throat "close up"  . Codeine Nausea Only    Dizziness (also) and nausea is SEVERE  . Contrast Media [Iodinated Diagnostic Agents] Hives    And nausea  . Valium [Diazepam] Nausea Only and Other (See Comments)    Dizziness (also)  . Penicillins Rash    Has patient had a  PCN reaction causing immediate rash, facial/tongue/throat swelling, SOB or lightheadedness with hypotension: Yes Has patient had a PCN reaction causing severe rash involving mucus membranes or skin necrosis: No Has patient had a PCN reaction that required hospitalization No Has patient had a PCN reaction occurring within the last 10 years: No If all of the above answers are "NO", then may proceed with Cephalosporin use.     Family History  Problem Relation Age of Onset  . Dementia Mother   . Diabetes Father   . Heart disease Father   . Hypertension Father   . Cancer Sister        endometrial  . Cancer Maternal Aunt        ovarian  . Adrenal disorder Neg Hx     BP 126/70   Pulse 85   Ht 5\' 3"  (1.6 m)   Wt 134 lb (60.8 kg)   SpO2 97%   BMI 23.74 kg/m    Review of Systems Denies pallor, n/v, syncope, diarrhea, weight loss, sob,  anxiety, diplopia, fever, arthralgias, rhinorrhea, and easy bruising.  She has intermittent tremor, flushing, chest pain, and headache.     Objective:   Physical Exam VS: see vs page GEN: no distress HEAD: head: no deformity eyes: no periorbital swelling, no proptosis external nose and ears are normal mouth: no lesion seen.  No neurofibromas. NECK: supple, thyroid is not enlarged CHEST WALL: no deformity LUNGS: clear to auscultation CV: reg rate and rhythm, no murmur ABD: abdomen is soft, nontender.  no hepatosplenomegaly.  not distended.  no hernia MUSCULOSKELETAL: muscle bulk and strength are grossly normal.  no obvious joint swelling.  gait is normal and steady EXTEMITIES: no deformity.  no ulcer on the feet.  feet are of normal color and temp.  no edema PULSES: dorsalis pedis intact bilat.  no carotid bruit NEURO:  cn 2-12 grossly intact.   readily moves all 4's.  sensation is intact to touch on the feet.  No tremor.  SKIN:  Normal texture and temperature.  No rash or suspicious lesion is visible.  No cafe-au-lait spots.  Not diaphoretic.  NODES:  None palpable at the neck PSYCH: alert, well-oriented.  Does not appear anxious nor depressed.  CT: 1. No acute finding.  No appendicitis. 2. Resolved peripancreatic edema and possible proximal bowel thickening on recent scan. 3. 2.5 cm right adrenal nodule that is indeterminate. In this patient who is allergic to contrast, recommend MRI follow-up. 4. Possible cyst in the uncinate of the pancreas measuring 8 mm, attention on follow-up. 5.  Aortic Atherosclerosis  24-HR urines for catechols are far above normal range  I personally reviewed electrocardiogram tracing (03/31/17): Indication: HTN Impression: NSR.  No MI.  LVH Compared to 4/18: LAE and long QT are resolved    Assessment & Plan:  Adrenal nodule, prob due to pheochronocytoma Abnormal CT of the pancreas, new to me, F/U needed.   Patient Instructions  Please continue the  same medication. Please see the surgeon at [facility] soon. Please come back here after the surgery.  1 of the things we will need to do in the future is to recheck the CT of the pancreas.

## 2017-04-29 NOTE — Patient Instructions (Signed)
Please continue the same medication. Please see the surgeon at [facility] soon. Please come back here after the surgery.  1 of the things we will need to do in the future is to recheck the CT of the pancreas.

## 2017-04-29 NOTE — Progress Notes (Signed)
Cardiology Office Note    Date:  04/30/2017   ID:  Jill [name], DOB [birth year], MRN [id number]  PCP:  Jill Frizzle, MD  Cardiologist: Previously Dr. Meda [name] --> Wishes to follow-up with Dr. Sallyanne [name]  Chief Complaint  Patient presents with  . Follow-up    1-2 weeks post hospital    History of Present Illness:    Jill [name] is a 61 y.o. female with past medical history of PAT, HTN, HLD, and prediabetes who presents to the office today for hospital follow-up.   She was recently admitted from 5/1 - 04/02/2017 for hypertensive urgency. Cardiology was consulted as her troponin values were mildly elevated at 0.09, 0.08, and 0.06. She did report having mild chest pain with an associated headache at the time of her elevated BP (212/73 upon arrival to the ED). Her elevated troponin values were thought to be secondary to demand ischemia and an outpatient stress test was recommended. 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 Urine metanephrines and plasma metanephrines were elevated when checked by her PCP along with her 24-hour urine test being elevated. CT of the abdomen showed a 2.4 cm right adrenal nodule, therefore she has been referred to general surgery for possible removal of her adrenal gland.   In talking with the patient today, she reports overall doing well since her recent hospitalization. She reports mild chest discomfort occurring at rest or with exertion which usually resolves within a matter of seconds. She denies any palpitations, orthopnea, PND, dyspnea on exertion, or lower extremity edema.  She has been checking her blood pressure regularly at home and reports systolic readings are usually in the 110s to 120s. The highest reading she has obtained is 140/92.  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 She plans to follow-up with him on 05/12/2017.   Past Medical History:  Diagnosis Date  . Arthritis    "hands" (03/24/2017)  . Colon polyp   . High cholesterol   . Multifocal atrial tachycardia (HCC)   . PAT (paroxysmal atrial tachycardia) ([place])    [name] 03/24/2017  . Peptic ulcer    "when I was a child"  . PONV (postoperative nausea and vomiting)    "bad when they did my wisdom teeth"  . Pre-diabetes   . Seasonal allergies     Past Surgical History:  Procedure Laterality Date  . CERVICAL CERCLAGE  X 2  . COLONOSCOPY W/ BIOPSIES AND POLYPECTOMY    . TONSILLECTOMY    . WISDOM TOOTH EXTRACTION      Current Medications: Outpatient Medications Prior to Visit  Medication Sig Dispense Refill  . cholecalciferol (VITAMIN D) 1000 UNITS tablet Take 1,000 Units by mouth daily.    Marland Kitchen doxazosin (CARDURA) 1 MG tablet Take 1 tablet (1 mg total) by mouth daily. 30 tablet 5  . Flax OIL Take 1 capsule by mouth 3 (three) times daily with meals.    Marland Kitchen glucose blood test strip Check BS QAM & 2 hours after supper 100 each 5  . Lancets MISC Fast Clix Lancets 100 each 3  . Multiple Vitamin (MULTIVITAMIN WITH MINERALS) TABS tablet Take 1 tablet by mouth daily.     No facility-administered medications prior to visit.      Allergies:   Tessalon [benzonatate]; Zyrtec [cetirizine]; Codeine; Contrast media [  iodinated diagnostic agents]; Valium [diazepam]; and Penicillins   Social History   Social History  . Marital status: Married    Spouse name: N/A  . Number of children: N/A  . Years of education: N/A   Social History Main Topics  . Smoking status: Never Smoker  . Smokeless tobacco: Never Used  . Alcohol use Yes     Comment: 03/24/2017 "might average 1 drink/month"  . Drug use: No  . Sexual activity: Yes   Other Topics Concern  . None   Social History Narrative  . None     Family History:  The patient's family history includes Cancer in  her maternal aunt and sister; Dementia in her mother; Diabetes in her father; Heart disease in her father; Hypertension in her father.   Review of Systems:   Please see the history of present illness.     General:  No chills, fever, night sweats or weight changes.  Cardiovascular:  No dyspnea on exertion, edema, orthopnea, palpitations, paroxysmal nocturnal dyspnea. Positive for chest pain.  Dermatological: No rash, lesions/masses Respiratory: No cough, dyspnea Urologic: No hematuria, dysuria Abdominal:   No nausea, vomiting, diarrhea, bright red blood per rectum, melena, or hematemesis Neurologic:  No visual changes, wkns, changes in mental status. All other systems reviewed and are otherwise negative except as noted above.   Physical Exam:    VS:  BP 112/64   Pulse 82   Ht 5\' 3"  (1.6 m)   Wt 135 lb (61.2 kg)   BMI 23.91 kg/m    General: Well developed, well nourished Caucasian female appearing in no acute distress. Head: Normocephalic, atraumatic, sclera non-icteric, no xanthomas, nares are without discharge.  Neck: No carotid bruits. JVD not elevated.  Lungs: Respirations regular and unlabored, without wheezes or rales.  Heart: Regular rate and rhythm. No S3 or S4.  No murmur, no rubs, or gallops appreciated. Abdomen: Soft, non-tender, non-distended with normoactive bowel sounds. No hepatomegaly. No rebound/guarding. No obvious abdominal masses. Msk:  Strength and tone appear normal for age. No joint deformities or effusions. Extremities: No clubbing or cyanosis. No lower extremity edema.  Distal pedal pulses are 2+ bilaterally. Neuro: Alert and oriented X 3. Moves all extremities spontaneously. No focal deficits noted. Psych:  Responds to questions appropriately with a normal affect. Skin: No rashes or lesions noted  Wt Readings from Last 3 Encounters:  04/30/17 135 lb (61.2 kg)  04/29/17 134 lb (60.8 kg)  04/06/17 132 lb (59.9 kg)     Studies/Labs Reviewed:   EKG:  EKG  is not ordered today.   Recent Labs: 03/24/2017: TSH 0.594 03/31/2017: ALT 52; Magnesium 2.3 04/01/2017: BUN 16; Creatinine, Ser 0.62; Hemoglobin 13.0; Platelets 332; Potassium 3.7; Sodium 138   Lipid Panel    Component Value Date/Time   CHOL 197 03/30/2017 0428   TRIG 232 (H) 03/30/2017 0428   HDL 48 03/30/2017 0428   CHOLHDL 4.1 03/30/2017 0428   VLDL 46 (H) 03/30/2017 0428   LDLCALC 103 (H) 03/30/2017 0428    Additional studies/ records that were reviewed today include:   Echocardiogram: 03/24/2017 Study Conclusions  - Procedure narrative: Transthoracic echocardiography. Image   quality was poor. The study was technically difficult, as a   result of poor acoustic windows and poor sound wave transmission. - Left ventricle: The cavity size was normal. Wall thickness was   increased in a pattern of mild LVH. Systolic function was   vigorous. The estimated ejection fraction was in the range of  65%   to 70%. Wall motion was normal; there were no regional wall   motion abnormalities. Features are consistent with a pseudonormal   left ventricular filling pattern, with concomitant abnormal   relaxation and increased filling pressure (grade 2 diastolic   dysfunction). - Left atrium: The atrium was mildly dilated.  Impressions:  - Vigorous LV systolic function; mild LVH; grade 2 diastolic   dysfunction; mild LAE.   Assessment:    1. Atypical chest pain   2. Elevated troponin   3. Adrenal mass ([place])   4. Essential hypertension      Plan:   In order of problems listed above:  1. Chest Pain/ Elevated Troponin - During her recent admission, she reported episodes of chest discomfort when her blood pressure was significantly elevated. She has noted mild episodes of chest discomfort occurring at rest or with exertion which usually resolves within seconds.  - during her admission troponin values were found to be mildly elevated at 0.09, 0.08, and 0.06 which was thought to be  secondary to her hypertensive urgency. An outpatient stress test was recommended.  - With her atypical episodes of chest pain and troponin values, will plan for a Exercise Treadmill to assess for ischemia. If normal, she would be of low-risk from a cardiac perspective for her upcoming adrenalectomy.   2. Adrenal Mass - she has experienced episodes of intermittent hypertensive urgency over the past several months.  - there was concern for a pheochromocytoma therefore urine metanephrines and plasma metanephrines were checked and found to be elevated when checked by her PCP. CT of the abdomen showed a 2.4 cm right adrenal nodule, therefore she has been referred to general surgery for possible removal of her adrenal gland.  - she has follow-up with a surgeon at [facility] scheduled for next month.   3. Essential HTN - BP well-controlled at 112/64. - continue Cardura 1mg  daily.    Medication Adjustments/Labs and Tests Ordered: Current medicines are reviewed at length with the patient today.  Concerns regarding medicines are outlined above.  Medication changes, Labs and Tests ordered today are listed in the Patient Instructions below. Patient Instructions  Medication Instructions:  NO CHANGES  If you need a refill on your cardiac medications before your next appointment, please call your pharmacy.  Follow-Up: Your physician wants you to follow-up in: 3 MONTHS WITH DR Jill [name].   TESTING: Your physician has requested that you have an exercise tolerance test. For further information please visit [email or web address]. Please also follow instruction sheet, as given.   Thank you for choosing [facility] at [employer], [place] [address], [place]  04/30/2017 7:48 PM    [address], [address] [place], [place]  [address] Phone: [phone]; Fax: [phone]  [address], [place], [place] [address] Phone: [phone]

## 2017-04-30 ENCOUNTER — Encounter: Payer: Self-pay | Admitting: Student

## 2017-04-30 ENCOUNTER — Ambulatory Visit (INDEPENDENT_AMBULATORY_CARE_PROVIDER_SITE_OTHER): Payer: BC Managed Care – PPO | Admitting: Student

## 2017-04-30 VITALS — BP 112/64 | HR 82 | Ht 63.0 in | Wt 135.0 lb

## 2017-04-30 DIAGNOSIS — E279 Disorder of adrenal gland, unspecified: Secondary | ICD-10-CM | POA: Diagnosis not present

## 2017-04-30 DIAGNOSIS — R0789 Other chest pain: Secondary | ICD-10-CM | POA: Diagnosis not present

## 2017-04-30 DIAGNOSIS — R748 Abnormal levels of other serum enzymes: Secondary | ICD-10-CM

## 2017-04-30 DIAGNOSIS — R778 Other specified abnormalities of plasma proteins: Secondary | ICD-10-CM

## 2017-04-30 DIAGNOSIS — R7989 Other specified abnormal findings of blood chemistry: Secondary | ICD-10-CM

## 2017-04-30 DIAGNOSIS — I10 Essential (primary) hypertension: Secondary | ICD-10-CM

## 2017-04-30 DIAGNOSIS — E278 Other specified disorders of adrenal gland: Secondary | ICD-10-CM

## 2017-04-30 NOTE — Patient Instructions (Addendum)
Medication Instructions:  NO CHANGES  If you need a refill on your cardiac medications before your next appointment, please call your pharmacy.  Follow-Up: Your physician wants you to follow-up in: 3 MONTHS WITH DR Sallyanne Kuster.   TESTING: Your physician has requested that you have an exercise tolerance test. For further information please visit [email or web address]. Please also follow instruction sheet, as given.   Thank you for choosing [facility] at [facility]!!

## 2017-05-01 NOTE — Progress Notes (Signed)
And thanks again MCr 

## 2017-05-06 ENCOUNTER — Ambulatory Visit
Admission: RE | Admit: 2017-05-06 | Discharge: 2017-05-06 | Disposition: A | Discharge status: Home / Self Care | Payer: BC Managed Care – PPO | Source: Ambulatory Visit | Attending: General Surgery | Admitting: General Surgery

## 2017-05-06 DIAGNOSIS — D3501 Benign neoplasm of right adrenal gland: Secondary | ICD-10-CM

## 2017-05-06 MED ORDER — GADOBENATE DIMEGLUMINE 529 MG/ML IV SOLN
12.0000 mL | Freq: Once | INTRAVENOUS | Status: AC | PRN
  Administered 2017-05-06: 12 mL via INTRAVENOUS

## 2017-05-11 ENCOUNTER — Ambulatory Visit: Payer: Self-pay | Admitting: Physician Assistant

## 2017-05-19 ENCOUNTER — Inpatient Hospital Stay (HOSPITAL_COMMUNITY): Admission: RE | Admit: 2017-05-19 | Payer: BC Managed Care – PPO | Source: Ambulatory Visit

## 2017-05-29 ENCOUNTER — Telehealth: Payer: Self-pay | Admitting: Student

## 2017-05-29 DIAGNOSIS — Z01818 Encounter for other preprocedural examination: Secondary | ICD-10-CM

## 2017-05-29 NOTE — Telephone Encounter (Signed)
[email or web address] 

## 2017-05-29 NOTE — Telephone Encounter (Signed)
New message    Jill [name] from [facility] has some questions and concerns about the notes placed in the system by [place] per pt last visit. Jill [name] requests a call back as soon as possible.

## 2017-05-29 NOTE — Telephone Encounter (Signed)
S/w Daleen Snook she states that Dr Kathi Simpers does not want to have pt have ETT because he states that this will "throw her into a hypertensive crisis" he is asking if there is anything else that could be done to have her cleared for her adrenalectomy. Daleen Snook notified that [facility] is out of the office until Monday and she states that [facility]MS can call her back at that time.

## 2017-05-29 NOTE — Telephone Encounter (Signed)
Routed call to rn

## 2017-06-01 NOTE — Telephone Encounter (Signed)
Left message hm number and cell

## 2017-06-01 NOTE — Telephone Encounter (Signed)
Schedule for [place] please. I keep getting denials for coronary CT without preceding nuclear test. MCr

## 2017-06-01 NOTE — Telephone Encounter (Signed)
Left detailed message for Daleen Snook from [facility].

## 2017-06-01 NOTE — Telephone Encounter (Signed)
Discussed with Dr. Sallyanne Kuster. Please arrange for [facility]. Thanks!

## 2017-06-02 ENCOUNTER — Encounter: Payer: Self-pay | Admitting: Family Medicine

## 2017-06-02 ENCOUNTER — Ambulatory Visit (INDEPENDENT_AMBULATORY_CARE_PROVIDER_SITE_OTHER): Payer: BC Managed Care – PPO | Admitting: Family Medicine

## 2017-06-02 VITALS — BP 130/66 | HR 74 | Temp 98.3°F | Resp 16 | Ht 63.0 in | Wt 136.0 lb

## 2017-06-02 DIAGNOSIS — L237 Allergic contact dermatitis due to plants, except food: Secondary | ICD-10-CM | POA: Diagnosis not present

## 2017-06-02 MED ORDER — MOMETASONE FUROATE 0.1 % EX CREA: 1.0000 "application " | TOPICAL_CREAM | Freq: Every day | CUTANEOUS | 0 refills | Status: DC

## 2017-06-02 NOTE — Progress Notes (Signed)
Subjective:    Patient ID: Jill [name], female    DOB: October 30, 1956, 61 y.o.   MRN: [id number]  HPI Reports a 2 day history of a rash. Rash is on both cheeks, the volar surface of her left wrist, her posterior thighs, and behind her left knee. The rash consists of erythematous pink papules in linear groupings consistent with scratch patterns. There are also clusters in the areas mentioned above. Patient says the rash developed after she was working in her garden in tall high weeds and vines.  She believes she was exposed to poison oak or poison ivy. Past Medical History:  Diagnosis Date  . Arthritis    "hands" (03/24/2017)  . Colon polyp   . High cholesterol   . Multifocal atrial tachycardia (HCC)   . PAT (paroxysmal atrial tachycardia) ([place])    [name] 03/24/2017  . Peptic ulcer    "when I was a child"  . PONV (postoperative nausea and vomiting)    "bad when they did my wisdom teeth"  . Pre-diabetes   . Seasonal allergies    Past Surgical History:  Procedure Laterality Date  . CERVICAL CERCLAGE  X 2  . COLONOSCOPY W/ BIOPSIES AND POLYPECTOMY    . TONSILLECTOMY    . WISDOM TOOTH EXTRACTION     Current Outpatient Prescriptions on File Prior to Visit  Medication Sig Dispense Refill  . glucose blood test strip Check BS QAM & 2 hours after supper 100 each 5  . Lancets MISC Fast Clix Lancets 100 each 3   No current facility-administered medications on file prior to visit.    Allergies  Allergen Reactions  . Tessalon [Benzonatate] Anaphylaxis    Per pt, this mixed with Zyrtec made her throat "close up"  . Zyrtec [Cetirizine] Anaphylaxis    Per pt, this mixed with Tessalon made her throat "close up"  . Codeine Nausea Only    Dizziness (also) and nausea is SEVERE  . Contrast Media [Iodinated Diagnostic Agents] Hives    And nausea  . Valium [Diazepam] Nausea Only and Other (See Comments)    Dizziness (also)  . Penicillins Rash    Has patient had a PCN reaction causing immediate  rash, facial/tongue/throat swelling, SOB or lightheadedness with hypotension: Yes Has patient had a PCN reaction causing severe rash involving mucus membranes or skin necrosis: No Has patient had a PCN reaction that required hospitalization No Has patient had a PCN reaction occurring within the last 10 years: No If all of the above answers are "NO", then may proceed with Cephalosporin use.    Social History   Social History  . Marital status: Married    Spouse name: N/A  . Number of children: N/A  . Years of education: N/A   Occupational History  . Not on file.   Social History Main Topics  . Smoking status: Never Smoker  . Smokeless tobacco: Never Used  . Alcohol use Yes     Comment: 03/24/2017 "might average 1 drink/month"  . Drug use: No  . Sexual activity: Yes   Other Topics Concern  . Not on file   Social History Narrative  . No narrative on file      Review of Systems  All other systems reviewed and are negative.      Objective:   Physical Exam  Constitutional: She appears well-developed and well-nourished.  Cardiovascular: Normal rate, regular rhythm and normal heart sounds.   Pulmonary/Chest: Effort normal and breath sounds normal.  Skin:  Skin is warm. Rash noted. There is erythema.  Vitals reviewed.         Assessment & Plan:  Allergic contact dermatitis due to plants, except food  Begin Elocon ointment once daily in the areas described except for the face. The rash on the face, I recommended the patient use over-the-counter hydrocortisone cream. Symptoms should improve over the next 7 days. Avoid oral prednisone given her history of pheochromocytoma pending surgical removal of the adrenal mass

## 2017-06-09 ENCOUNTER — Telehealth (HOSPITAL_COMMUNITY): Payer: Self-pay | Admitting: Student

## 2017-06-09 NOTE — Telephone Encounter (Signed)
I called pt to see if she was ready to schedule the myoview that [place] ordered for her and the patient voiced that she will not be needing the test now, so it will be removed from the workqueue.

## 2017-07-06 ENCOUNTER — Telehealth: Payer: Self-pay | Admitting: Family Medicine

## 2017-07-06 DIAGNOSIS — R1011 Right upper quadrant pain: Secondary | ICD-10-CM

## 2017-07-06 NOTE — Telephone Encounter (Signed)
Patient left VM on Kim's phone stating she had just seen her provider at [facility] and they are requesting patient's PCP to schedule a gall bladder US.  CB# [phone]

## 2017-07-07 NOTE — Telephone Encounter (Signed)
Ok with ordering [place] of gall bladder.  So we can order the test, can you verify with the patient why they wanted this (i.e. We need a diagnosis).

## 2017-07-07 NOTE — Telephone Encounter (Signed)
Ultrasound ordered and when we get results back we need to fax to [facility] at [phone]

## 2017-07-07 NOTE — Telephone Encounter (Signed)
LMTRC

## 2017-07-17 ENCOUNTER — Ambulatory Visit
Admission: RE | Admit: 2017-07-17 | Discharge: 2017-07-17 | Disposition: A | Discharge status: Home / Self Care | Payer: BC Managed Care – PPO | Source: Ambulatory Visit | Attending: Family Medicine | Admitting: Family Medicine

## 2017-07-17 ENCOUNTER — Encounter: Payer: Self-pay | Admitting: Family Medicine

## 2017-07-17 DIAGNOSIS — R1011 Right upper quadrant pain: Secondary | ICD-10-CM

## 2017-07-24 ENCOUNTER — Encounter: Payer: Self-pay | Admitting: Cardiovascular Disease

## 2017-07-24 ENCOUNTER — Ambulatory Visit (INDEPENDENT_AMBULATORY_CARE_PROVIDER_SITE_OTHER): Payer: BC Managed Care – PPO | Admitting: Cardiovascular Disease

## 2017-07-24 VITALS — BP 120/68 | HR 90 | Ht 63.0 in

## 2017-07-24 DIAGNOSIS — D3501 Benign neoplasm of right adrenal gland: Secondary | ICD-10-CM | POA: Diagnosis not present

## 2017-07-24 NOTE — Progress Notes (Signed)
Cardiology Office Note:    Date:  07/24/2017   ID:  TESSY PAWELSKI, DOB 10-23-1956, MRN [id number]  PCP:  Susy Frizzle, MD  Cardiologist:  Sanda Klein, MD    Referring MD: Susy Frizzle, MD   Chief Complaint  Patient presents with  . Follow-up    no complaints   Status post pheochromocytoma surgery  History of Present Illness:    Jill [name] is a 61 y.o. 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 Since then her arrhythmia, blood pressure and glucose intolerance have all resolved. She feels great. She is not taking any medications other than vitamin supplements. The plan is for her to have repeat abdominal CT to follow-up on what might be a pseudocyst or IPMN in her pancreas as well as follow-up with her endocrinologist in January.  Past Medical History:  Diagnosis Date  . Arthritis    "hands" (03/24/2017)  . Colon polyp   . High cholesterol   . Multifocal atrial tachycardia (HCC)   . PAT (paroxysmal atrial tachycardia) ([place])    [name] 03/24/2017  . Peptic ulcer    "when I was a child"  . PONV (postoperative nausea and vomiting)    "bad when they did my wisdom teeth"  . Pre-diabetes   . Seasonal allergies     Past Surgical History:  Procedure Laterality Date  . CERVICAL CERCLAGE  X 2  . COLONOSCOPY W/ BIOPSIES AND POLYPECTOMY    . TONSILLECTOMY    . WISDOM TOOTH EXTRACTION      Current Medications: Current Meds  Medication Sig  . cholecalciferol (VITAMIN D) 1000 units tablet Take 1,000 Units by mouth daily.  . Multiple Vitamin (MULTIVITAMIN WITH MINERALS) TABS tablet Take 1 tablet by mouth daily.     Allergies:   Tessalon [benzonatate]; Zyrtec [cetirizine]; Codeine; Contrast media [iodinated diagnostic agents]; Penicillins; and Valium [diazepam]   Social History   Social History  . Marital  status: Married    Spouse name: N/A  . Number of children: N/A  . Years of education: N/A   Social History Main Topics  . Smoking status: Never Smoker  . Smokeless tobacco: Never Used  . Alcohol use Yes     Comment: 03/24/2017 "might average 1 drink/month"  . Drug use: No  . Sexual activity: Yes   Other Topics Concern  . Not on file   Social History Narrative  . No narrative on file     Family History: The patient's family history includes Cancer in her maternal aunt and sister; Dementia in her mother; Diabetes in her father; Heart disease in her father; Hypertension in her father. There is no history of Adrenal disorder. ROS:   Please see the history of present illness.     All other systems reviewed and are negative.  EKGs/Labs/Other Studies Reviewed:    The following studies were reviewed today: MRI 06-07-17, surgical report July 2018  EKG:  EKG is  ordered today.  The ekg ordered today demonstrates sinus rhythm, normal tracing  Recent Labs: 03/24/2017: TSH 0.594 03/31/2017: ALT 52; Magnesium 2.3 04/01/2017: BUN 16; Creatinine, Ser 0.62; Hemoglobin 13.0; Platelets 332; Potassium 3.7; Sodium 138  Recent Lipid Panel    Component Value Date/Time   CHOL 197 03/30/2017 0428   TRIG 232 (H) 03/30/2017 0428   HDL 48 03/30/2017 0428   CHOLHDL 4.1  03/30/2017 0428   VLDL 46 (H) 03/30/2017 0428   LDLCALC 103 (H) 03/30/2017 0428    Physical Exam:    VS:  BP 120/68   Pulse 90   Ht 5\' 3"  (1.6 m)     Wt Readings from Last 3 Encounters:  06/02/17 136 lb (61.7 kg)  04/30/17 135 lb (61.2 kg)  04/29/17 134 lb (60.8 kg)     GEN:  Well nourished, well developed in no acute distress HEENT: Normal NECK: No JVD; No carotid bruits LYMPHATICS: No lymphadenopathy CARDIAC: RRR, no murmurs, rubs, gallops RESPIRATORY:  Clear to auscultation without rales, wheezing or rhonchi  ABDOMEN: Soft, non-tender, non-distended MUSCULOSKELETAL:  No edema; No deformity  SKIN: Warm and  dry NEUROLOGIC:  Alert and oriented x 3 PSYCHIATRIC:  Normal affect   ASSESSMENT:    No diagnosis found. PLAN:    In order of problems listed above:  1. Status post pheochromocytoma right adrenal gland: Many of her metabolic and vascular problems have completely resolved following tumor excision. She has a very healthy lifestyle. Normal blood pressure and heart rate today. Normal ECG. Follow-up with cardiology as needed only.   Medication Adjustments/Labs and Tests Ordered: Current medicines are reviewed at length with the patient today.  Concerns regarding medicines are outlined above.  No orders of the defined types were placed in this encounter.  No orders of the defined types were placed in this encounter.   Signed, Sanda Klein, MD  07/24/2017 1:56 PM    [place]

## 2017-07-24 NOTE — Patient Instructions (Signed)
Dr Croitoru recommends that you follow-up with him as needed. 

## 2017-09-21 IMAGING — US US ABDOMEN LIMITED
1 series · 14 of 25 positions shown · non-contrast
Comparison: CT from 03/27/2017

CLINICAL DATA: Pancreatitis

EXAM:
US ABDOMEN LIMITED - RIGHT UPPER QUADRANT

[Series 1: us abdomen limited · 0.20mm/px · 14 of 46 slices shown]
[im 1/46]
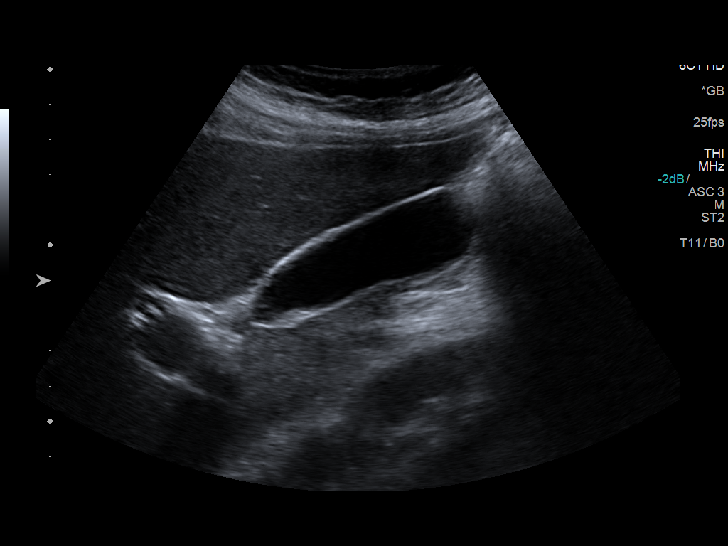
[im 4/46]
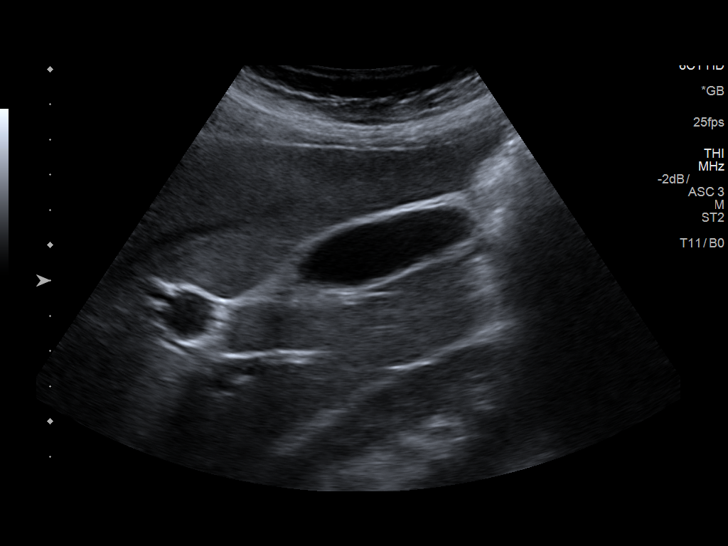
[im 8/46]
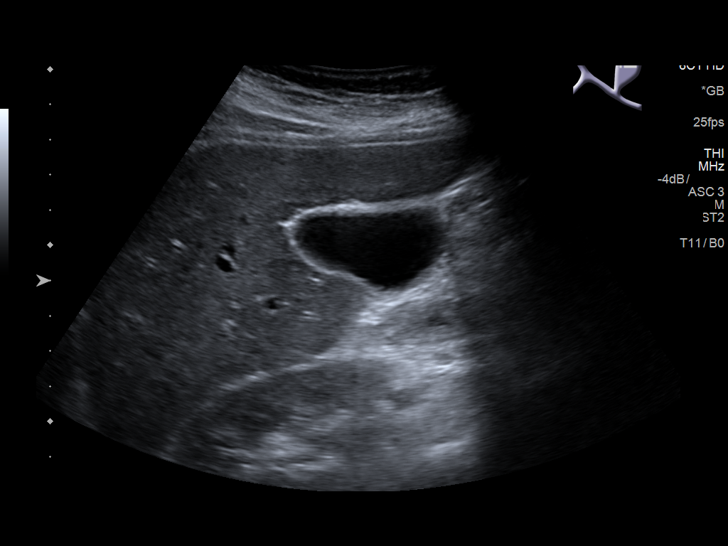
[im 12/46]
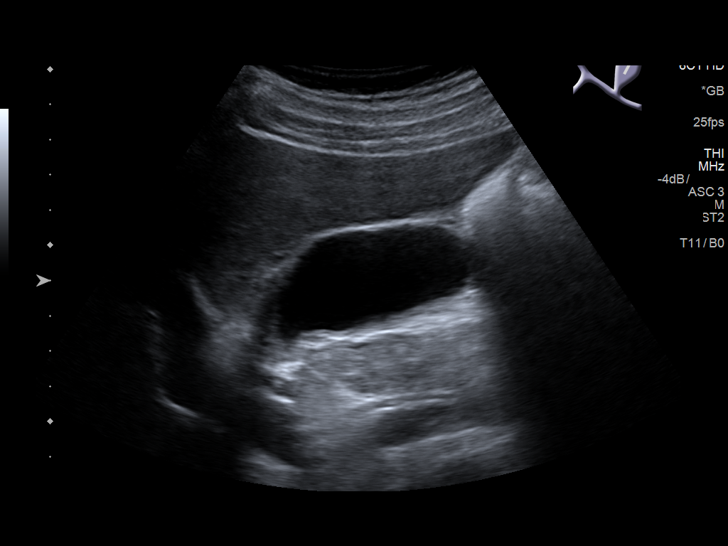
[im 16/46]
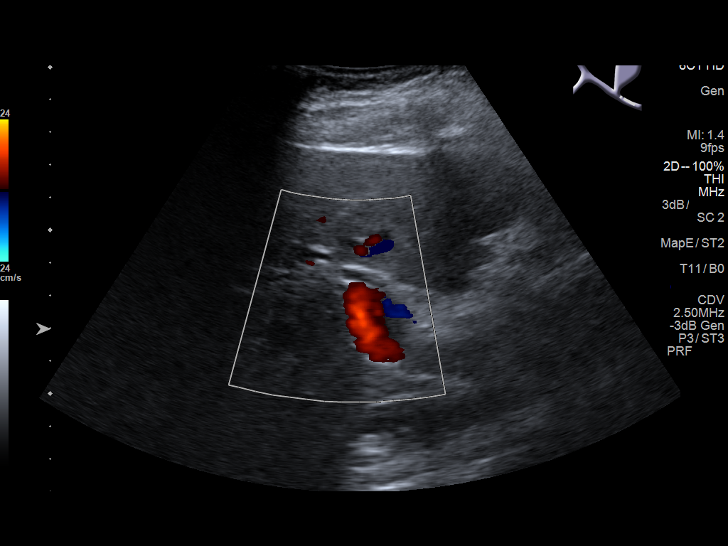
[im 17/46]
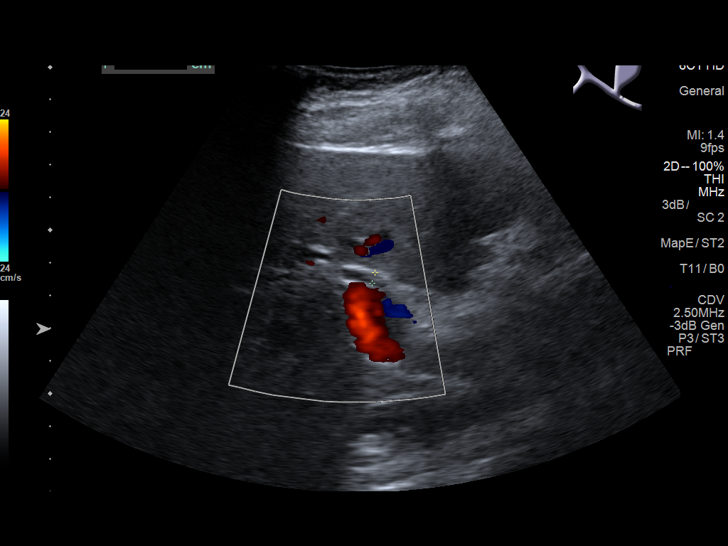
[im 21/46]
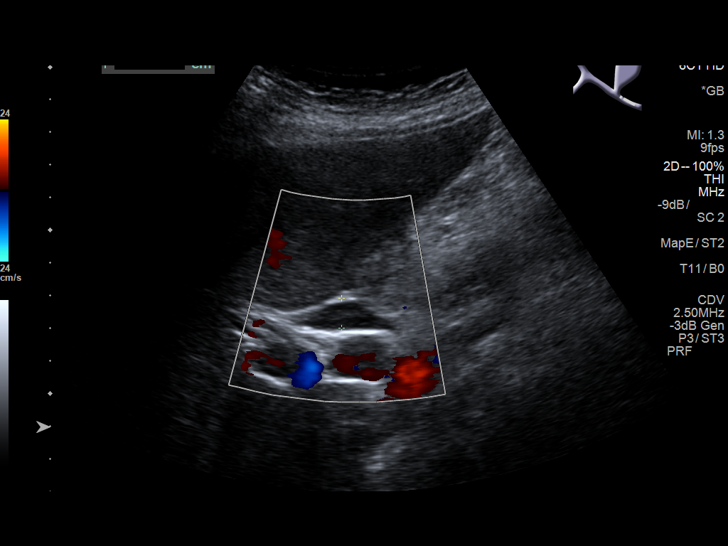
[im 25/46]
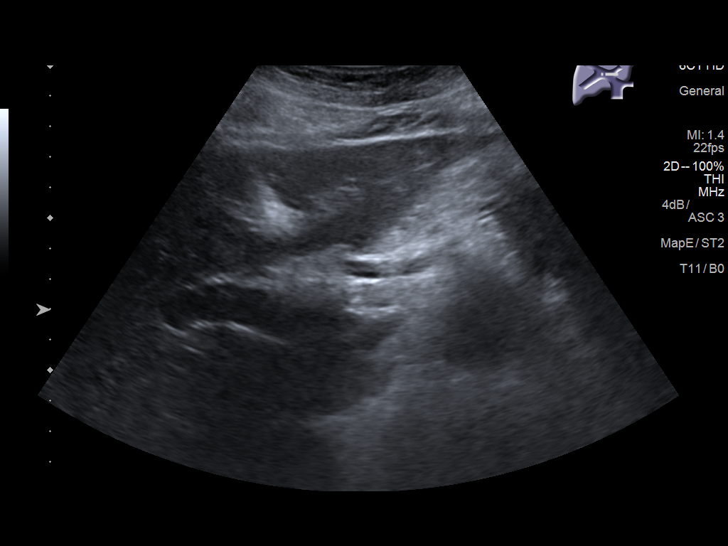
[im 29/46]
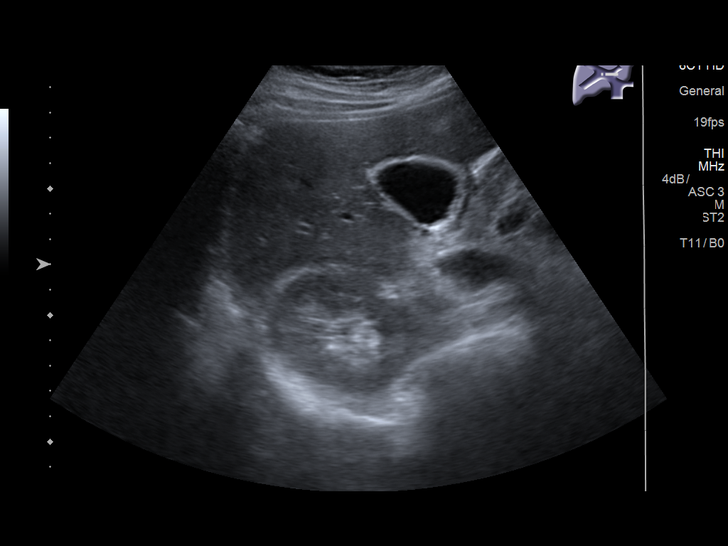
[im 31/46]
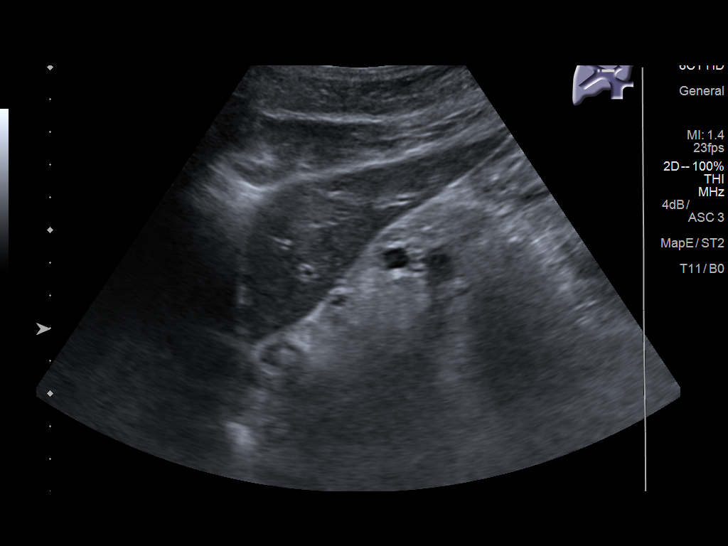
[im 34/46]
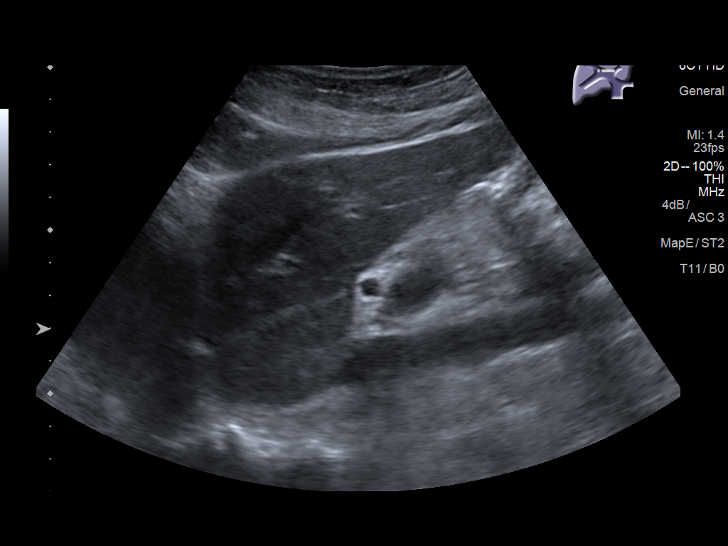
[im 38/46]
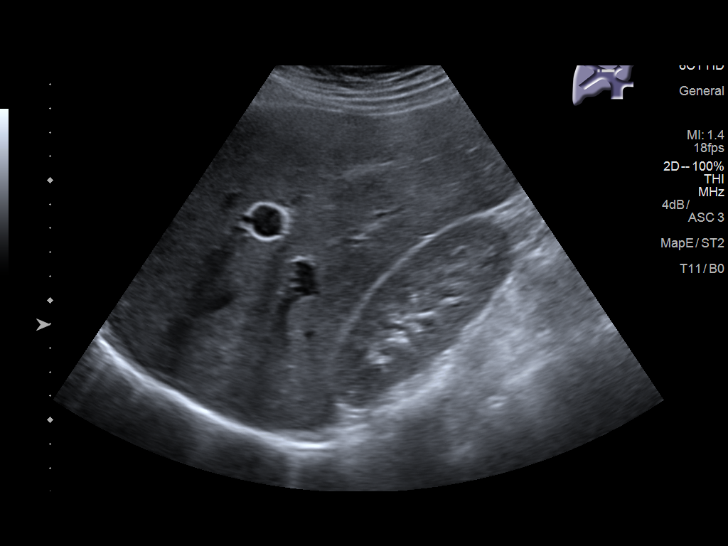
[im 42/46]
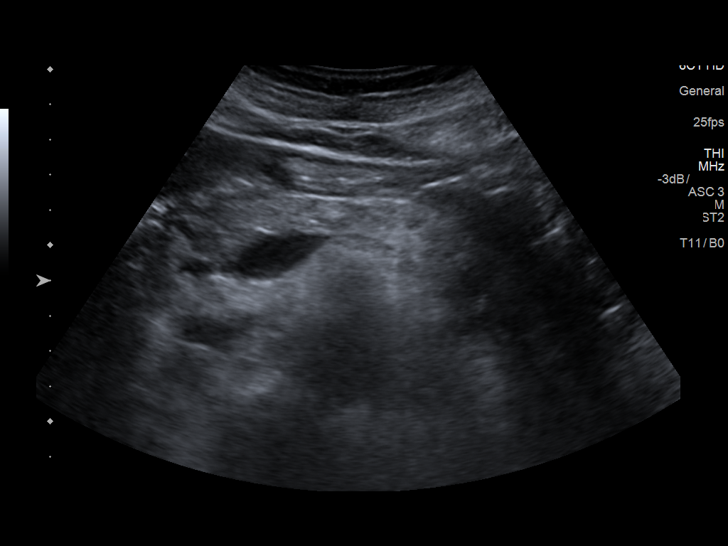
[im 46/46]
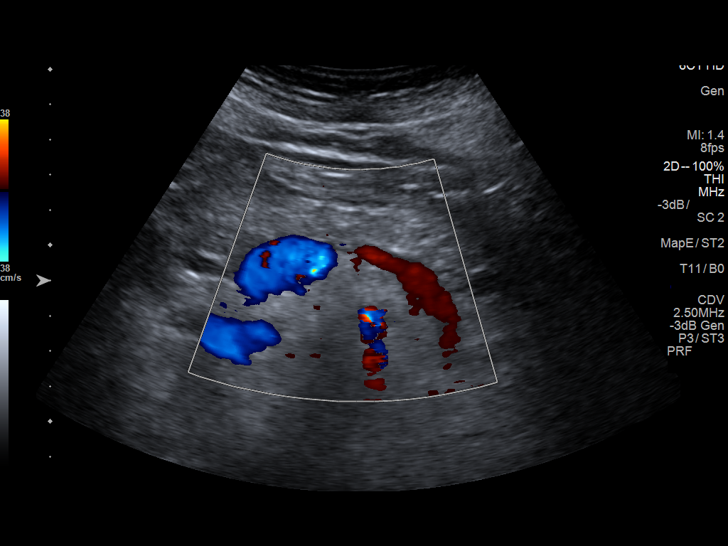

[14 of 25 positions shown; findings below may reference images not displayed]

FINDINGS: Gallbladder:

No gallstones or wall thickening visualized. No sonographic Murphy
sign noted by sonographer.

Common bile duct:

Diameter: 3 mm at the level of hepatic artery. No
choledocholithiasis.

Liver:

No focal lesion identified. Within normal limits in parenchymal
echogenicity.
IMPRESSION: No acute sonographic abnormality in the right upper quadrant of the
abdomen.

## 2017-12-30 IMAGING — CT CT ABD-PELV W/O CM
2 of 4 series · 16 of 46 positions shown, 18 images · non-contrast
Comparison: None.

CLINICAL DATA: Vomiting, fever, headache and hypertension. One day
duration.

EXAM:
CT ABDOMEN AND PELVIS WITHOUT CONTRAST
TECHNIQUE: Multidetector CT imaging of the abdomen and pelvis was performed
following the standard protocol without IV contrast.

[Series 3: a/p w/o 5mm · axial · non-contrast · 0.76mm/px · z∈[-926,-481]mm · 13 of 97 slices shown, 15 images]
[im 4/97  soft-tissue]
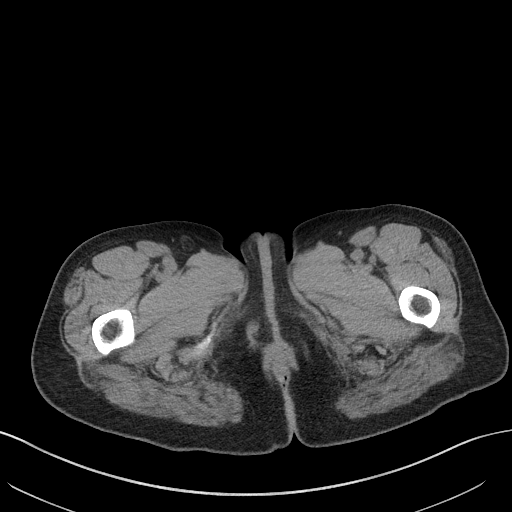
[im 4/97  bone]
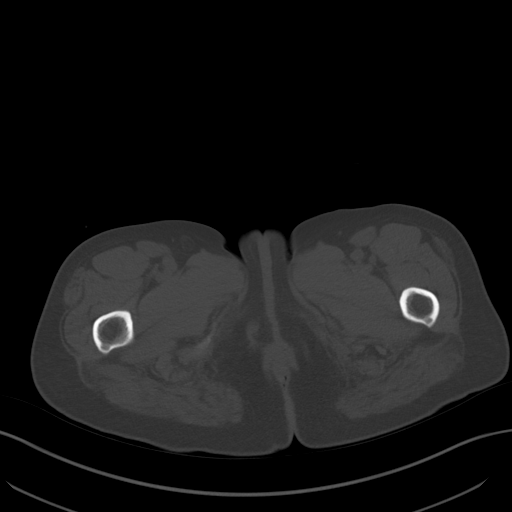
[im 12/97  soft-tissue]
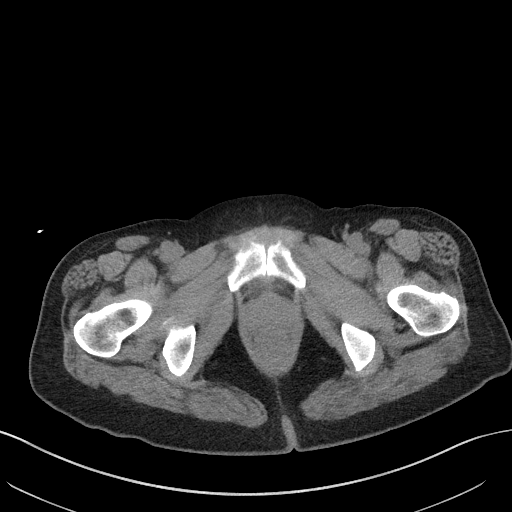
[im 19/97  soft-tissue]
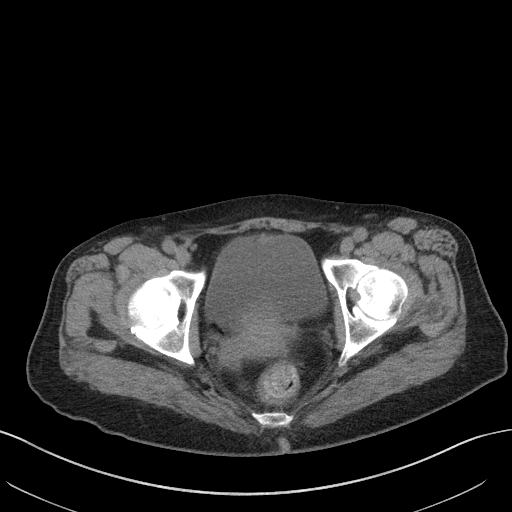
[im 26/97  soft-tissue]
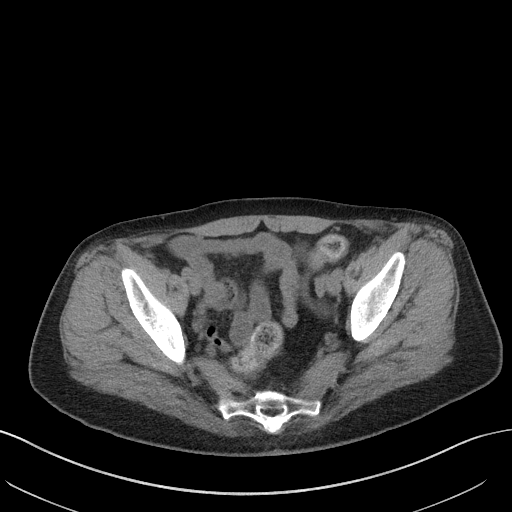
[im 34/97  soft-tissue]
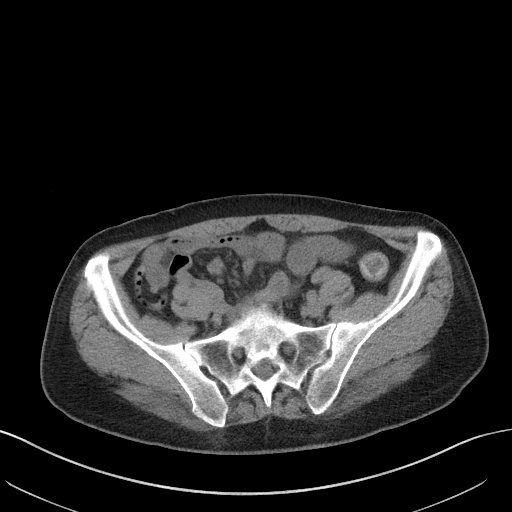
[im 41/97  soft-tissue]
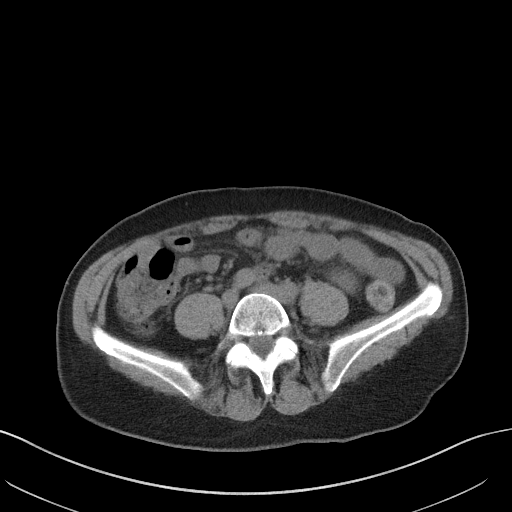
[im 49/97  soft-tissue]
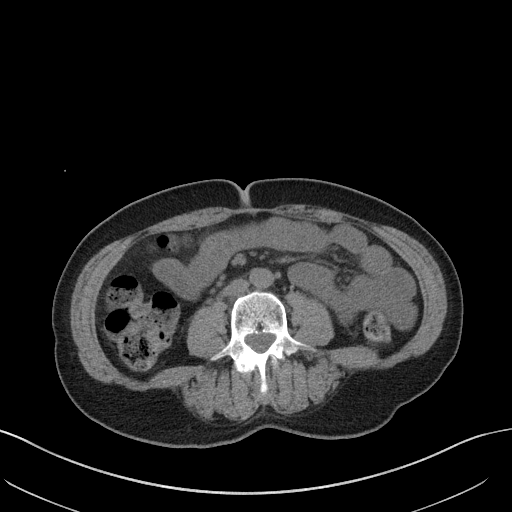
[im 56/97  soft-tissue]
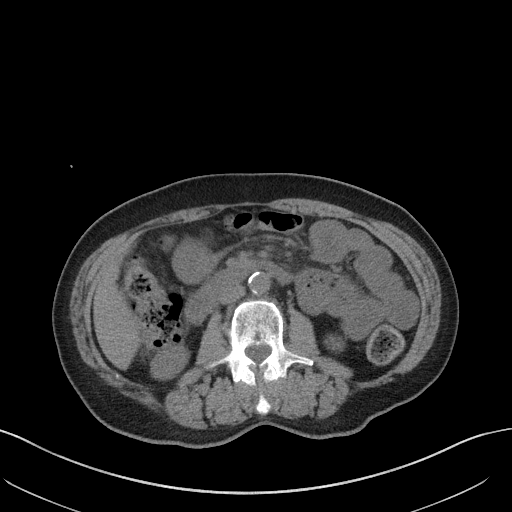
[im 63/97  soft-tissue]
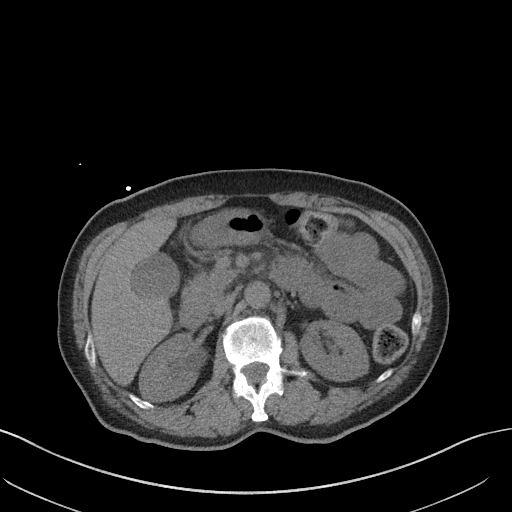
[im 63/97  bone]
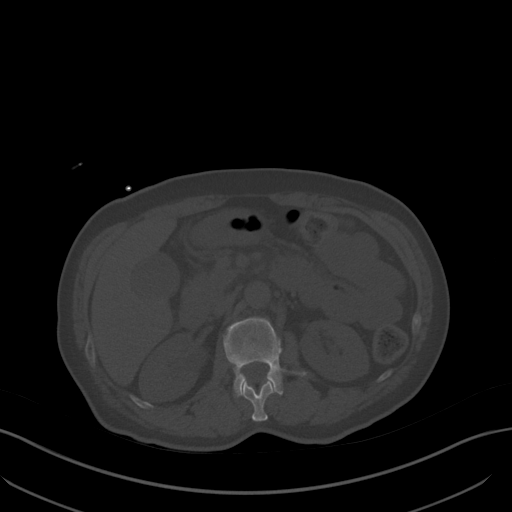
[im 71/97  soft-tissue]
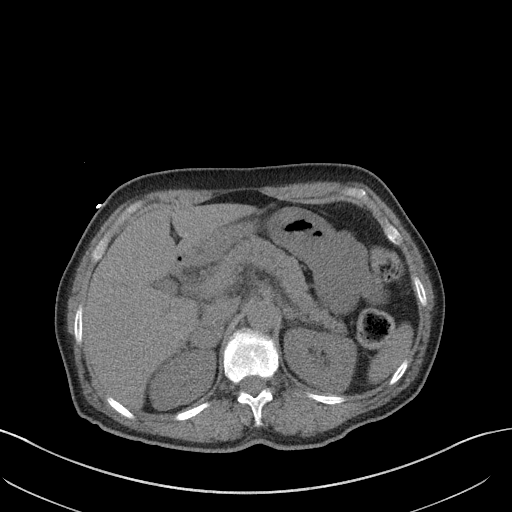
[im 78/97  soft-tissue]
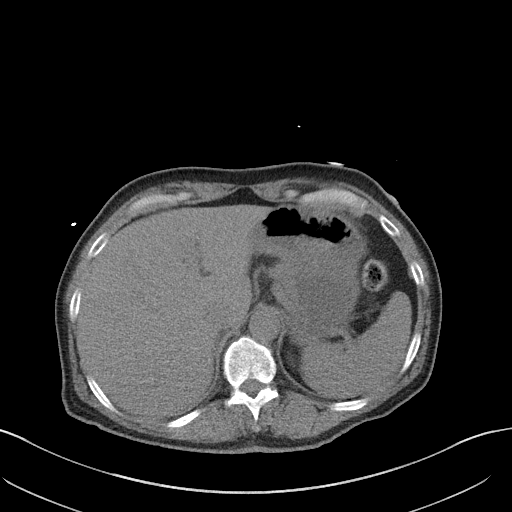
[im 85/97  soft-tissue]
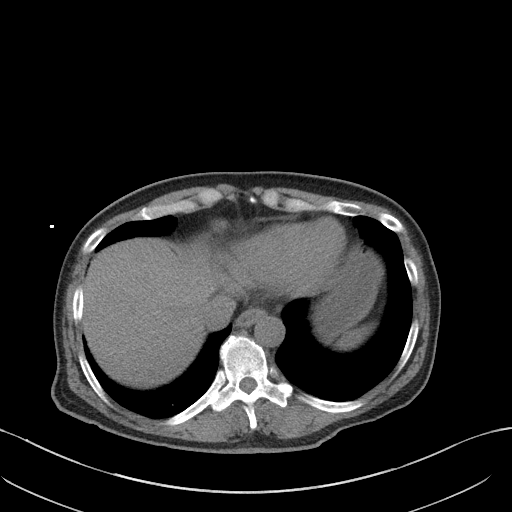
[im 93/97  soft-tissue]
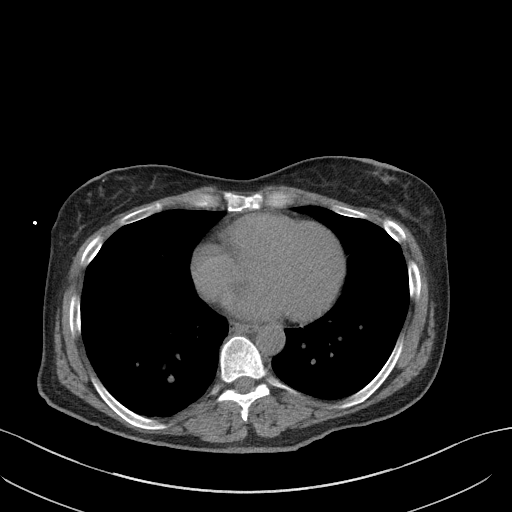

[Series 5: a/p w/o cor · coronal · non-contrast · 0.71mm/px · 3 of 117 slices shown]
[im 39/117  soft-tissue]
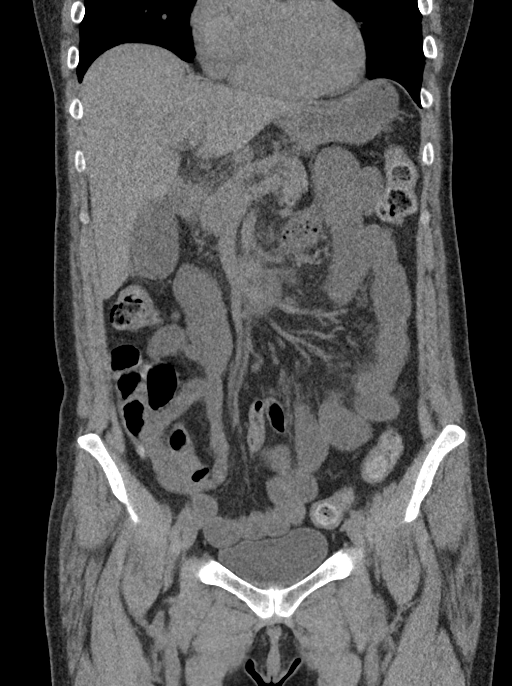
[im 52/117  soft-tissue]
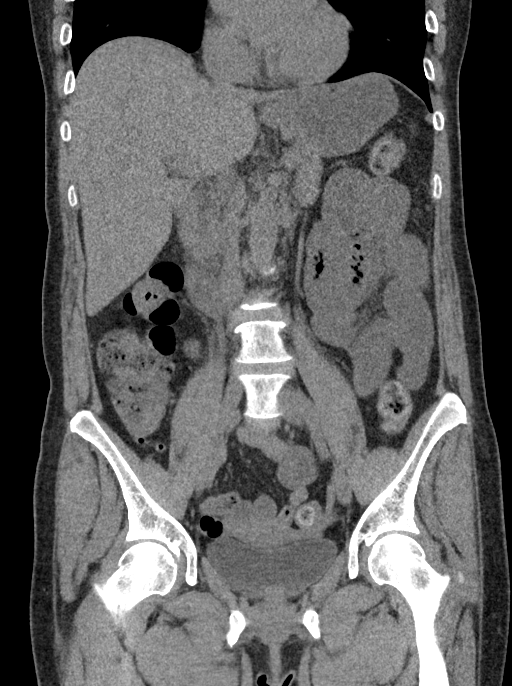
[im 65/117  soft-tissue]
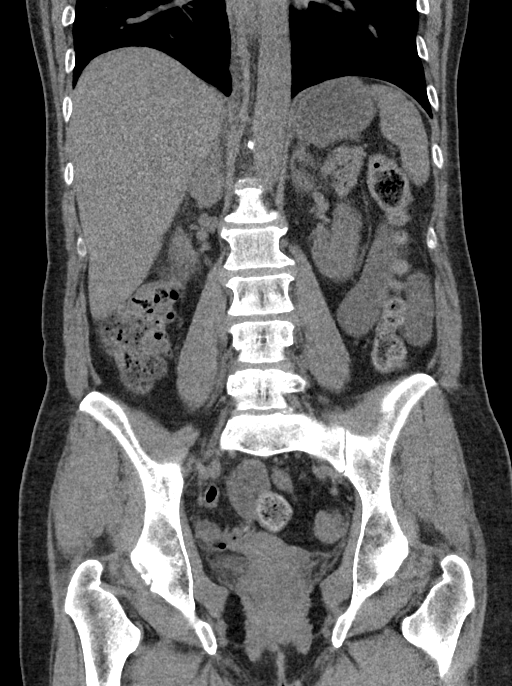

[16 of 46 positions shown; findings below may reference images not displayed]

FINDINGS: Lower chest: Normal except for minimal atelectasis/scar.

Hepatobiliary: Normal

Pancreas: Probable orally pancreatitis with mild edema and swelling
in the region the pancreatic head.

Spleen: Normal

Adrenals/Urinary Tract: Adrenal glands are normal. Kidneys are
normal. No cyst, mass, stone or hydronephrosis.

Stomach/Bowel: Cannot rule out antral gastritis with wall
thickening. This is not a definite finding. No other intestinal
abnormality. Appendix is normal.

Vascular/Lymphatic: Aortic atherosclerosis. No aneurysm. IVC is
normal. No retroperitoneal adenopathy.

Reproductive: Normal

Other: No free fluid or air.

Musculoskeletal: Normal
IMPRESSION: Probable early pancreatitis with mild pancreatic swelling and
surrounding edema.

Cannot rule out antral gastritis.  This is not a certain finding.

Aortic atherosclerosis.

## 2018-05-28 ENCOUNTER — Other Ambulatory Visit: Payer: Self-pay | Admitting: *Deleted

## 2018-05-28 MED ORDER — VALACYCLOVIR HCL 1 G PO TABS: 1000.0000 mg | ORAL_TABLET | Freq: Two times a day (BID) | ORAL | 0 refills | Status: DC

## 2019-05-17 LAB — HM MAMMOGRAPHY

## 2019-05-25 ENCOUNTER — Encounter: Payer: Self-pay | Admitting: Family Medicine

## 2020-10-24 ENCOUNTER — Other Ambulatory Visit: Payer: Self-pay | Admitting: *Deleted

## 2020-10-24 ENCOUNTER — Telehealth: Payer: Self-pay | Admitting: *Deleted

## 2020-10-24 DIAGNOSIS — E785 Hyperlipidemia, unspecified: Secondary | ICD-10-CM

## 2020-10-24 DIAGNOSIS — I160 Hypertensive urgency: Secondary | ICD-10-CM

## 2020-10-24 DIAGNOSIS — R739 Hyperglycemia, unspecified: Secondary | ICD-10-CM

## 2020-10-24 DIAGNOSIS — Z20822 Contact with and (suspected) exposure to covid-19: Secondary | ICD-10-CM

## 2020-10-24 DIAGNOSIS — Z13220 Encounter for screening for lipoid disorders: Secondary | ICD-10-CM

## 2020-10-24 DIAGNOSIS — E559 Vitamin D deficiency, unspecified: Secondary | ICD-10-CM

## 2020-10-24 DIAGNOSIS — R7303 Prediabetes: Secondary | ICD-10-CM

## 2020-10-24 NOTE — Telephone Encounter (Signed)
Call placed to patient.   Reports that she and spouse Aaron Edelman would like to have Spike Antibody testing done to determine if they have had COVID and would like the actual number of antibodies. Patient has not been vaccinated at this time.  Advised that number of antibodies is currently clinically insignificant as there is no set number to determine coverage at this time. Patient verbalized understanding, but would like to proceed. Advised that lab may not be covered by insurance. Verbalized understanding.   Also states that patient son will be traveling to [facility] for Christmas and she and spouse Aaron Edelman will need COVID swab then. Advised that our COVID swab is PCR and will require 48hours to result. Advised to have swab done in December closer to Christmas.   Advised that CPE is needed as patient has not been seen >2 years. Appointment scheduled and lab orders placed.

## 2020-10-24 NOTE — Telephone Encounter (Signed)
Noted  

## 2020-10-24 NOTE — Addendum Note (Signed)
Addended by: Sheral Flow on: 10/24/2020 10:15 AM   Modules accepted: Orders

## 2020-11-07 ENCOUNTER — Other Ambulatory Visit: Payer: BC Managed Care – PPO

## 2020-11-07 ENCOUNTER — Other Ambulatory Visit: Payer: Self-pay

## 2020-11-07 DIAGNOSIS — E559 Vitamin D deficiency, unspecified: Secondary | ICD-10-CM

## 2020-11-07 DIAGNOSIS — Z136 Encounter for screening for cardiovascular disorders: Secondary | ICD-10-CM

## 2020-11-07 DIAGNOSIS — Z13220 Encounter for screening for lipoid disorders: Secondary | ICD-10-CM

## 2020-11-07 DIAGNOSIS — R739 Hyperglycemia, unspecified: Secondary | ICD-10-CM

## 2020-11-07 DIAGNOSIS — I160 Hypertensive urgency: Secondary | ICD-10-CM

## 2020-11-07 DIAGNOSIS — R7303 Prediabetes: Secondary | ICD-10-CM

## 2020-11-07 DIAGNOSIS — E785 Hyperlipidemia, unspecified: Secondary | ICD-10-CM

## 2020-11-09 ENCOUNTER — Other Ambulatory Visit: Payer: Self-pay

## 2020-11-09 ENCOUNTER — Ambulatory Visit (INDEPENDENT_AMBULATORY_CARE_PROVIDER_SITE_OTHER): Payer: BC Managed Care – PPO | Admitting: Family Medicine

## 2020-11-09 VITALS — BP 120/68 | HR 81 | Temp 98.0°F | Ht 63.0 in | Wt 146.0 lb

## 2020-11-09 DIAGNOSIS — Z0000 Encounter for general adult medical examination without abnormal findings: Secondary | ICD-10-CM

## 2020-11-09 DIAGNOSIS — Z1211 Encounter for screening for malignant neoplasm of colon: Secondary | ICD-10-CM

## 2020-11-09 DIAGNOSIS — E785 Hyperlipidemia, unspecified: Secondary | ICD-10-CM

## 2020-11-09 DIAGNOSIS — Z20822 Contact with and (suspected) exposure to covid-19: Secondary | ICD-10-CM

## 2020-11-09 DIAGNOSIS — Z23 Encounter for immunization: Secondary | ICD-10-CM

## 2020-11-09 DIAGNOSIS — Z0001 Encounter for general adult medical examination with abnormal findings: Secondary | ICD-10-CM

## 2020-11-09 NOTE — Progress Notes (Signed)
Subjective:    Patient ID: Jill [name], female    DOB: [birth year], 64 y.o.   MRN: [id number]  HPI Patient is a very pleasant 64 year old Caucasian female here today for complete physical exam.  She denies any medical concerns.  She is overdue for a colonoscopy and she agrees to allow me to send her to see a gastroenterologist regarding this.  She is overdue for mammogram.  However she would like to schedule this on her iron.  She does not need a Pap smear at the present time as this is been performed within the last 3 years per her report.  She has no indication for premature osteoporosis and therefore declines a bone density test until age 81.  She is due for a flu shot, shingles vaccine, and a Covid shot.  She politely declines a Covid shot and the shingles vaccine but would like a flu shot.. Her most recent lab work as listed below: Appointment on 11/07/2020  Component Date Value Ref Range Status  . Vit D, 25-Hydroxy 11/07/2020 51  30 - 100 ng/mL Final   Comment: Vitamin D Status         25-OH Vitamin D: . Deficiency:                    <20 ng/mL Insufficiency:             20 - 29 ng/mL Optimal:                 > or = 30 ng/mL . For 25-OH Vitamin D testing on patients on  D2-supplementation and patients for whom quantitation  of D2 and D3 fractions is required, the [facility]ure[facility](TM) 25-OH VIT D, (D2,D3), LC/MS/MS is recommended: order  code [phone] (patients >15yrs). See Note 1 . Note 1 . For additional information, please refer to  http://education.[facility].com/faq/FAQ199  (This link is being provided for informational/ educational purposes only.)   . Creatinine, Urine 11/07/2020 74  20 - 275 mg/dL Final  . Microalb, Ur 11/07/2020 <0.2  mg/dL Final   Comment: Reference Range Not established   . Microalb Creat Ratio 11/07/2020 NOTE  <30 mcg/mg creat Final   Comment: NOTE: The urine albumin value is less than  0.2 mg/dL therefore we are unable to calculate  excretion  and/or creatinine ratio. . The ADA defines abnormalities in albumin excretion as follows: Marland Kitchen Albuminuria Category        Result (mcg/mg creatinine) . Normal to Mildly increased   <30 Moderately increased         30-299  Severely increased           > OR = 300 . The ADA recommends that at least two of three specimens collected within a 3-6 month period be abnormal before considering a patient to be within a diagnostic category.   . Cholesterol 11/07/2020 243* <200 mg/dL Final  . HDL 11/07/2020 56  > OR = 50 mg/dL Final  . Triglycerides 11/07/2020 157* <150 mg/dL Final  . LDL Cholesterol (Calc) 11/07/2020 158* mg/dL (calc) Final   Comment: Reference range: <100 . Desirable range <100 mg/dL for primary prevention;   <70 mg/dL for patients with CHD or diabetic patients  with > or = 2 CHD risk factors. Marland Kitchen LDL-C is now calculated using the Martin-Hopkins  calculation, which is a validated novel method providing  better accuracy than the Friedewald equation in the  estimation of LDL-C.  Cresenciano Genre et al. Annamaria Helling. 7681;157(26): [phone]  (  http://education.[facility].com/faq/FAQ164)   . Total CHOL/HDL Ratio 11/07/2020 4.3  <5.0 (calc) Final  . Non-HDL Cholesterol (Calc) 11/07/2020 187* <130 mg/dL (calc) Final   Comment: For patients with diabetes plus 1 major ASCVD risk  factor, treating to a non-HDL-C goal of <100 mg/dL  (LDL-C of <70 mg/dL) is considered a therapeutic  option.   . Hgb A1c MFr Bld 11/07/2020 5.5  <5.7 % of total Hgb Final   Comment: For the purpose of screening for the presence of diabetes: . <5.7%       Consistent with the absence of diabetes 5.7-6.4%    Consistent with increased risk for diabetes             (prediabetes) > or =6.5%  Consistent with diabetes . This assay result is consistent with a decreased risk of diabetes. . Currently, no consensus exists regarding use of hemoglobin A1c for diagnosis of diabetes in children. . According to  American Diabetes Association (ADA) guidelines, hemoglobin A1c <7.0% represents optimal control in non-pregnant diabetic patients. Different metrics may apply to specific patient populations.  Standards of Medical Care in Diabetes(ADA). .   . Mean Plasma Glucose 11/07/2020 111  mg/dL Final  . eAG (mmol/L) 11/07/2020 6.2  mmol/L Final  . Glucose, Bld 11/07/2020 94  65 - 99 mg/dL Final   Comment: .            Fasting reference interval .   . BUN 11/07/2020 12  7 - 25 mg/dL Final  . Creat 11/07/2020 0.68  0.50 - 0.99 mg/dL Final   Comment: For patients >68 years of age, the reference limit for Creatinine is approximately 13% higher for people identified as African-American. .   . GFR, Est Non African American 11/07/2020 94  > OR = 60 mL/min/1.15m2 Final  . GFR, Est African American 11/07/2020 109  > OR = 60 mL/min/1.77m2 Final  . BUN/Creatinine Ratio 38/18/2993 NOT APPLICABLE  6 - 22 (calc) Final  . Sodium 11/07/2020 141  135 - 146 mmol/L Final  . Potassium 11/07/2020 4.4  3.5 - 5.3 mmol/L Final  . Chloride 11/07/2020 106  98 - 110 mmol/L Final  . CO2 11/07/2020 26  20 - 32 mmol/L Final  . Calcium 11/07/2020 9.4  8.6 - 10.4 mg/dL Final  . Total Protein 11/07/2020 6.2  6.1 - 8.1 g/dL Final  . Albumin 11/07/2020 4.1  3.6 - 5.1 g/dL Final  . Globulin 11/07/2020 2.1  1.9 - 3.7 g/dL (calc) Final  . AG Ratio 11/07/2020 2.0  1.0 - 2.5 (calc) Final  . Total Bilirubin 11/07/2020 0.4  0.2 - 1.2 mg/dL Final  . Alkaline phosphatase (APISO) 11/07/2020 51  37 - 153 U/L Final  . AST 11/07/2020 12  10 - 35 U/L Final  . ALT 11/07/2020 10  6 - 29 U/L Final  . WBC 11/07/2020 4.9  3.8 - 10.8 Thousand/uL Final  . RBC 11/07/2020 4.57  3.80 - 5.10 Million/uL Final  . Hemoglobin 11/07/2020 12.5  11.7 - 15.5 g/dL Final  . HCT 11/07/2020 37.4  35.0 - 45.0 % Final  . MCV 11/07/2020 81.8  80.0 - 100.0 fL Final  . MCH 11/07/2020 27.4  27.0 - 33.0 pg Final  . MCHC 11/07/2020 33.4  32.0 - 36.0 g/dL Final   . RDW 11/07/2020 12.9  11.0 - 15.0 % Final  . Platelets 11/07/2020 272  140 - 400 Thousand/uL Final  . MPV 11/07/2020 10.1  7.5 - 12.5 fL Final  . Neutro Abs  11/07/2020 2,916  1,500 - 7,800 cells/uL Final  . Lymphs Abs 11/07/2020 1,465  850 - 3,900 cells/uL Final  . Absolute Monocytes 11/07/2020 299  200 - 950 cells/uL Final  . Eosinophils Absolute 11/07/2020 172  15 - 500 cells/uL Final  . Basophils Absolute 11/07/2020 49  0 - 200 cells/uL Final  . Neutrophils Relative % 11/07/2020 59.5  % Final  . Total Lymphocyte 11/07/2020 29.9  % Final  . Monocytes Relative 11/07/2020 6.1  % Final  . Eosinophils Relative 11/07/2020 3.5  % Final  . Basophils Relative 11/07/2020 1.0  % Final   Past Medical History:  Diagnosis Date  . Arthritis    "hands" (03/24/2017)  . Colon polyp   . High cholesterol   . Multifocal atrial tachycardia (HCC)   . PAT (paroxysmal atrial tachycardia) ([place])    [name] 03/24/2017  . Peptic ulcer    "when I was a child"  . PONV (postoperative nausea and vomiting)    "bad when they did my wisdom teeth"  . Pre-diabetes   . Seasonal allergies    Past Surgical History:  Procedure Laterality Date  . CERVICAL CERCLAGE  X 2  . COLONOSCOPY W/ BIOPSIES AND POLYPECTOMY    . TONSILLECTOMY    . WISDOM TOOTH EXTRACTION     Current Outpatient Medications on File Prior to Visit  Medication Sig Dispense Refill  . cholecalciferol (VITAMIN D) 1000 units tablet Take 1,000 Units by mouth daily.    . Multiple Vitamin (MULTIVITAMIN WITH MINERALS) TABS tablet Take 1 tablet by mouth daily.    . valACYclovir (VALTREX) 1000 MG tablet Take 1 tablet (1,000 mg total) by mouth 2 (two) times daily. 20 tablet 0   No current facility-administered medications on file prior to visit.   Allergies  Allergen Reactions  . Tessalon [Benzonatate] Anaphylaxis    Per pt, this mixed with Zyrtec made her throat "close up"  . Zyrtec [Cetirizine] Anaphylaxis    Per pt, this mixed with Tessalon made  her throat "close up"  . Codeine Nausea Only    Dizziness (also) and nausea is SEVERE  . Contrast Media [Iodinated Diagnostic Agents] Hives    And nausea  . Penicillins Rash    Has patient had a PCN reaction causing immediate rash, facial/tongue/throat swelling, SOB or lightheadedness with hypotension: Yes Has patient had a PCN reaction causing severe rash involving mucus membranes or skin necrosis: No Has patient had a PCN reaction that required hospitalization No Has patient had a PCN reaction occurring within the last 10 years: No If all of the above answers are "NO", then may proceed with Cephalosporin use.   . Valium [Diazepam] Nausea Only and Other (See Comments)    Dizziness (also)   Social History   Socioeconomic History  . Marital status: Married    Spouse name: Not on file  . Number of children: Not on file  . Years of education: Not on file  . Highest education level: Not on file  Occupational History  . Not on file  Tobacco Use  . Smoking status: Never Smoker  . Smokeless tobacco: Never Used  Substance and Sexual Activity  . Alcohol use: Yes    Comment: 03/24/2017 "might average 1 drink/month"  . Drug use: No  . Sexual activity: Yes  Other Topics Concern  . Not on file  Social History Narrative  . Not on file   Social Determinants of Health   Financial Resource Strain: Not on file  Food Insecurity:  Not on file  Transportation Needs: Not on file  Physical Activity: Not on file  Stress: Not on file  Social Connections: Not on file  Intimate Partner Violence: Not on file   Family History  Problem Relation Age of Onset  . Dementia Mother   . Diabetes Father   . Heart disease Father   . Hypertension Father   . Cancer Sister        endometrial  . Cancer Maternal Aunt        ovarian  . Adrenal disorder Neg Hx       Review of Systems  All other systems reviewed and are negative.      Objective:   Physical Exam Vitals reviewed.  Constitutional:       General: She is not in acute distress.    Appearance: She is well-developed. She is not diaphoretic.  HENT:     Head: Normocephalic and atraumatic.     Right Ear: External ear normal.     Left Ear: External ear normal.     Nose: Nose normal.     Mouth/Throat:     Pharynx: No oropharyngeal exudate.  Eyes:     General: No scleral icterus.       Right eye: No discharge.        Left eye: No discharge.     Conjunctiva/sclera: Conjunctivae normal.     Pupils: Pupils are equal, round, and reactive to light.  Neck:     Thyroid: No thyromegaly.     Vascular: No JVD.     Trachea: No tracheal deviation.  Cardiovascular:     Rate and Rhythm: Normal rate and regular rhythm.     Heart sounds: Normal heart sounds. No murmur heard. No friction rub. No gallop.   Pulmonary:     Effort: Pulmonary effort is normal. No respiratory distress.     Breath sounds: Normal breath sounds. No stridor. No wheezing or rales.  Chest:     Chest wall: No tenderness.  Abdominal:     General: Bowel sounds are normal. There is no distension.     Palpations: Abdomen is soft. There is no mass.     Tenderness: There is no abdominal tenderness. There is no guarding or rebound.  Musculoskeletal:        General: No tenderness. Normal range of motion.     Cervical back: Normal range of motion and neck supple.  Lymphadenopathy:     Cervical: No cervical adenopathy.  Skin:    General: Skin is warm.     Coloration: Skin is not pale.     Findings: No erythema or rash.  Neurological:     Mental Status: She is alert and oriented to person, place, and time.     Cranial Nerves: No cranial nerve deficit.     Motor: No abnormal muscle tone.     Coordination: Coordination normal.     Deep Tendon Reflexes: Reflexes are normal and symmetric.  Psychiatric:        Behavior: Behavior normal.        Thought Content: Thought content normal.        Judgment: Judgment normal.           Assessment & Plan:  Colon  cancer screening - Plan: Ambulatory referral to Gastroenterology  Exposure to COVID-19 virus - Plan: SARS CoV2 Serology(COVID19) AB(IgG,IgM),Immunoassay  Hyperlipidemia, unspecified hyperlipidemia type  General medical exam  Physical exam is completely normal.  I did recommend that she start  taking 1200 mg a day of calcium and 1000 units a day of vitamin D.  She received her flu shot.  I recommended a Covid vaccine and a shingles vaccine but she politely declined.  I will schedule her for a colonoscopy.  Also recommended a mammogram but she prefers to schedule this herself.  Pap smear is up-to-date.  I recommended a statin due to her elevated cholesterol but she declines.  She is willing to take fish oil 2000 mg a day.  She has been exposed to Covid recently and she would like to check her antibody titers to see if she is immune.  She is asymptomatic

## 2020-11-11 LAB — LIPID PANEL
Cholesterol: 243 mg/dL — ABNORMAL HIGH (ref <200)
HDL: 56 mg/dL (ref >=50)
LDL Cholesterol (Calc): 158 mg/dL (calc) — ABNORMAL HIGH
Non-HDL Cholesterol (Calc): 187 mg/dL (calc) — ABNORMAL HIGH (ref <130)
Total CHOL/HDL Ratio: 4.3 (calc) (ref <5.0)
Triglycerides: 157 mg/dL — ABNORMAL HIGH (ref <150)

## 2020-11-11 LAB — CBC WITH DIFFERENTIAL/PLATELET
Absolute Monocytes: 299 cells/uL (ref 200–950)
Basophils Absolute: 49 cells/uL (ref 0–200)
Basophils Relative: 1 %
Eosinophils Absolute: 172 cells/uL (ref 15–500)
Eosinophils Relative: 3.5 %
HCT: 37.4 % (ref 35.0–45.0)
Hemoglobin: 12.5 g/dL (ref 11.7–15.5)
Lymphs Abs: 1465 cells/uL (ref 850–3900)
MCH: 27.4 pg (ref 27.0–33.0)
MCHC: 33.4 g/dL (ref 32.0–36.0)
MCV: 81.8 fL (ref 80.0–100.0)
MPV: 10.1 fL (ref 7.5–12.5)
Monocytes Relative: 6.1 %
Neutro Abs: 2916 cells/uL (ref 1500–7800)
Neutrophils Relative %: 59.5 %
Platelets: 272 10*3/uL (ref 140–400)
RBC: 4.57 10*6/uL (ref 3.80–5.10)
RDW: 12.9 % (ref 11.0–15.0)
Total Lymphocyte: 29.9 %
WBC: 4.9 10*3/uL (ref 3.8–10.8)

## 2020-11-11 LAB — HEMOGLOBIN A1C
Hgb A1c MFr Bld: 5.5 % of total Hgb (ref <5.7)
Mean Plasma Glucose: 111 mg/dL
eAG (mmol/L): 6.2 mmol/L

## 2020-11-11 LAB — COMPLETE METABOLIC PANEL WITH GFR
AG Ratio: 2 (calc) (ref 1.0–2.5)
ALT: 10 U/L (ref 6–29)
AST: 12 U/L (ref 10–35)
Albumin: 4.1 g/dL (ref 3.6–5.1)
Alkaline phosphatase (APISO): 51 U/L (ref 37–153)
BUN: 12 mg/dL (ref 7–25)
CO2: 26 mmol/L (ref 20–32)
Calcium: 9.4 mg/dL (ref 8.6–10.4)
Chloride: 106 mmol/L (ref 98–110)
Creat: 0.68 mg/dL (ref 0.50–0.99)
GFR, Est African American: 109 mL/min/{1.73_m2} (ref >=60)
GFR, Est Non African American: 94 mL/min/{1.73_m2} (ref >=60)
Globulin: 2.1 g/dL (calc) (ref 1.9–3.7)
Glucose, Bld: 94 mg/dL (ref 65–99)
Potassium: 4.4 mmol/L (ref 3.5–5.3)
Sodium: 141 mmol/L (ref 135–146)
Total Bilirubin: 0.4 mg/dL (ref 0.2–1.2)
Total Protein: 6.2 g/dL (ref 6.1–8.1)

## 2020-11-11 LAB — SARS-COV-2 SEMI-QUANTITATIVE TOTAL ANTIBODY, SPIKE: SARS COV2 AB, Total Spike Semi QN: <0.4 U/mL (ref <0.8)

## 2020-11-11 LAB — MICROALBUMIN / CREATININE URINE RATIO
Creatinine, Urine: 74 mg/dL (ref 20–275)
Microalb, Ur: <0.2 mg/dL

## 2020-11-11 LAB — VITAMIN D 25 HYDROXY (VIT D DEFICIENCY, FRACTURES): Vit D, 25-Hydroxy: 51 ng/mL (ref 30–100)

## 2020-11-12 ENCOUNTER — Telehealth: Payer: Self-pay

## 2020-11-12 DIAGNOSIS — Z23 Encounter for immunization: Secondary | ICD-10-CM | POA: Diagnosis not present

## 2020-11-12 NOTE — Telephone Encounter (Signed)
Pt called dbl for covid serology results, verbalized understanding with recommendation of getting covid vaccine

## 2020-12-21 ENCOUNTER — Encounter: Payer: Self-pay | Admitting: Family Medicine

## 2021-03-01 ENCOUNTER — Telehealth: Payer: Self-pay | Admitting: Family Medicine

## 2021-03-01 NOTE — Telephone Encounter (Signed)
Patient called to request referral for gastroenterologist Dr. Carol Ada. Appt scheduled for 4/11. Please contact his office at [phone].  Please advise patient at [phone] and leave a message if any additional information needed.

## 2021-03-05 ENCOUNTER — Other Ambulatory Visit: Payer: Self-pay

## 2021-03-05 DIAGNOSIS — Z1211 Encounter for screening for malignant neoplasm of colon: Secondary | ICD-10-CM

## 2021-03-05 NOTE — Telephone Encounter (Signed)
Referral placed.

## 2022-04-09 NOTE — Progress Notes (Signed)
?Jill [name] D.O. ?[facility] ?Jill [name] ?Phone: [phone] ?Subjective:   ?I, Jill [name], am serving as a scribe for Dr. Hulan [name]. ? ?This visit occurred during the SARS-CoV-2 public health emergency.  Safety protocols were in place, including screening questions prior to the visit, additional usage of staff PPE, and extensive cleaning of exam room while observing appropriate contact time as indicated for disinfecting solutions.  ? ? ?I'm seeing this patient by the request  of:  Jill Frizzle, MD ? ?CC: Back and hip pain ? ?[id number]  ?Jill [name] is a 66 y.o. female coming in with complaint of hip and back pain. Patient states that her pain started in December. Insidious onset. Feels pain is deep in each hip joint. Lumbar spine pain is in center of spine. Likes to ride horses and is unable to get into proper positioning to be able to do so. Has been working out to help core strength. Was seeing 2 diff chiropractors which was helping. Did have one adjustment that caused excruciating pain in sternum for 2 weeks. Notes weakness in L leg as compared to R.  ? ?Patient also notes thinking that she had carpal tunnel syndrome but after a C and T spine adjustment her wrist pain and numbness will go away. Patient notes being scared of having neck adjusted.  ? ?Reviewed patient's outside records and past medical history with patient having a pheochromocytoma previously.  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  Lab values have been unremarkable last MRI was in February of this year. ? ?Past Medical History:  ?Diagnosis Date  ? Arthritis   ? "hands" (03/24/2017)  ? Colon polyp   ? High cholesterol   ? Multifocal atrial tachycardia (HCC)   ? PAT (paroxysmal atrial tachycardia) ([place])   ? [name] 03/24/2017  ? Peptic ulcer   ? "when I was a child"  ? PONV (postoperative nausea and vomiting)   ?  "bad when they did my wisdom teeth"  ? Pre-diabetes   ? Seasonal allergies   ? ?Past Surgical History:  ?Procedure Laterality Date  ? CERVICAL CERCLAGE  X 2  ? COLONOSCOPY W/ BIOPSIES AND POLYPECTOMY    ? TONSILLECTOMY    ? WISDOM TOOTH EXTRACTION    ? ?Social History  ? ?Socioeconomic History  ? Marital status: Married  ?  Spouse name: Not on file  ? Number of children: Not on file  ? Years of education: Not on file  ? Highest education level: Not on file  ?Occupational History  ? Not on file  ?Tobacco Use  ? Smoking status: Never  ? Smokeless tobacco: Never  ?Substance and Sexual Activity  ? Alcohol use: Yes  ?  Comment: 03/24/2017 "might average 1 drink/month"  ? Drug use: No  ? Sexual activity: Yes  ?Other Topics Concern  ? Not on file  ?Social History Narrative  ? Not on file  ? ?Social Determinants of Health  ? ?Financial Resource Strain: Not on file  ?Food Insecurity: Not on file  ?Transportation Needs: Not on file  ?Physical Activity: Not on file  ?Stress: Not on file  ?Social Connections: Not on file  ? ?Allergies  ?Allergen Reactions  ? Tessalon [Benzonatate] Anaphylaxis  ?  Per pt, this mixed with Zyrtec made her throat "close up"  ? Zyrtec [Cetirizine] Anaphylaxis  ?  Per pt, this mixed with Tessalon made  her throat "close up"  ? Codeine Nausea Only  ?  Dizziness (also) and nausea is SEVERE  ? Contrast Media [Iodinated Contrast Media] Hives  ?  And nausea  ? Penicillins Rash  ?  Has patient had a PCN reaction causing immediate rash, facial/tongue/throat swelling, SOB or lightheadedness with hypotension: Yes ?Has patient had a PCN reaction causing severe rash involving mucus membranes or skin necrosis: No ?Has patient had a PCN reaction that required hospitalization No ?Has patient had a PCN reaction occurring within the last 10 years: No ?If all of the above answers are "NO", then may proceed with Cephalosporin use. ?  ? Valium [Diazepam] Nausea Only and Other (See Comments)  ?  Dizziness (also)   ? ?Family History  ?Problem Relation Age of Onset  ? Dementia Mother   ? Diabetes Father   ? Heart disease Father   ? Hypertension Father   ? Cancer Sister   ?     endometrial  ? Cancer Maternal Aunt   ?     ovarian  ? Adrenal disorder Neg Hx   ? ? ? ? ? ? ? ?Current Outpatient Medications (Other):  ?  cholecalciferol (VITAMIN D) 1000 units tablet, Take 1,000 Units by mouth daily. ?  Multiple Vitamin (MULTIVITAMIN WITH MINERALS) TABS tablet, Take 1 tablet by mouth daily. ?  valACYclovir (VALTREX) 1000 MG tablet, Take 1 tablet (1,000 mg total) by mouth 2 (two) times daily. ? ? ?Reviewed prior external information including notes and imaging from  ?primary care provider ?As well as notes that were available from care everywhere and other healthcare systems. ? ?Past medical history, social, surgical and family history all reviewed in electronic medical record.  No pertanent information unless stated regarding to the chief complaint.  ? ?Review of Systems: ? No headache, visual changes, nausea, vomiting, diarrhea, constipation, dizziness, abdominal pain, skin rash, fevers, chills, night sweats, weight loss, swollen lymph nodes, body aches, joint swelling, chest pain, shortness of breath, mood changes. POSITIVE muscle aches ? ?Objective  ?Blood pressure 132/84, pulse 84, height '5\' 3"'$  (1.6 m), weight 143 lb (64.9 kg), SpO2 97 %. ?  ?General: No apparent distress alert and oriented x3 mood and affect normal, dressed appropriately.  ?HEENT: Pupils equal, extraocular movements intact  ?Respiratory: Patient's speak in full sentences and does not appear short of breath  ?Cardiovascular: No lower extremity edema, non tender, no erythema  ?Gait normal with good balance and coordination.  ?MSK: Back exam significant tightness noted of the hip flexors bilaterally.  Patient does have some limited internal range of motion of the hips bilaterally.  Negative straight leg test but does have tightness of the hamstrings bilaterally.  5  out of 5 strength of the lower extremities otherwise.  Limited extension of the back by 5 degrees.  Neck exam does have some limited sidebending and lacks the last 5 degrees of extension. ? ?Osteopathic findings ?C2 flexed rotated and side bent right ?C5 flexed rotated and side bent left ?T3 extended rotated and side bent right inhaled third rib ?T8 extended rotated and side bent left ?L2 flexed rotated and side bent right ?L5 flexed rotated and side bent left ?Sacrum right on right ? ?[id number]; 15 additional minutes spent for Therapeutic exercises as stated in above notes.  This included exercises focusing on stretching, strengthening, with significant focus on eccentric aspects.   Long term goals include an improvement in range of motion, strength, endurance as well as avoiding reinjury. Patient's frequency would  include in 1-2 times a day, 3-5 times a week for a duration of 6-12 weeks. Sacroiliac Joint Mobilization and Rehab ?1. Work on pretzel stretching, shoulder back and leg draped in front. 3-5 sets, 30 sec.Marland Kitchen ?2. hip abductor rotations. standing, hip flexion and rotation outward then inward. 3 sets, 15 reps. when can do comfortably, add ankle weights starting at 2 pounds.  ?3. cross over stretching - shoulder back to ground, same side leg crossover. 3-5 sets for 30 min..  ?4. rolling up and back knees to chest and rocking. ?5. sacral tilt - 5 sets, hold for 5-10 seconds ? Proper technique shown and discussed handout in great detail with ATC.  All questions were discussed and answered.  ? ?  ?Impression and Recommendations:  ?  ? ?The above documentation has been reviewed and is accurate and complete Lyndal Pulley, DO ? ? ? ?

## 2022-04-10 ENCOUNTER — Encounter: Payer: Self-pay | Admitting: Family Medicine

## 2022-04-10 ENCOUNTER — Ambulatory Visit: Payer: BC Managed Care – PPO | Admitting: Family Medicine

## 2022-04-10 ENCOUNTER — Ambulatory Visit (INDEPENDENT_AMBULATORY_CARE_PROVIDER_SITE_OTHER): Payer: BC Managed Care – PPO

## 2022-04-10 VITALS — BP 132/84 | HR 84 | Ht 63.0 in | Wt 143.0 lb

## 2022-04-10 DIAGNOSIS — M9901 Segmental and somatic dysfunction of cervical region: Secondary | ICD-10-CM

## 2022-04-10 DIAGNOSIS — M25552 Pain in left hip: Secondary | ICD-10-CM

## 2022-04-10 DIAGNOSIS — M9902 Segmental and somatic dysfunction of thoracic region: Secondary | ICD-10-CM

## 2022-04-10 DIAGNOSIS — M9908 Segmental and somatic dysfunction of rib cage: Secondary | ICD-10-CM | POA: Diagnosis not present

## 2022-04-10 DIAGNOSIS — M9904 Segmental and somatic dysfunction of sacral region: Secondary | ICD-10-CM | POA: Diagnosis not present

## 2022-04-10 DIAGNOSIS — M9903 Segmental and somatic dysfunction of lumbar region: Secondary | ICD-10-CM

## 2022-04-10 DIAGNOSIS — M25551 Pain in right hip: Secondary | ICD-10-CM

## 2022-04-10 DIAGNOSIS — M545 Low back pain, unspecified: Secondary | ICD-10-CM | POA: Diagnosis not present

## 2022-04-10 DIAGNOSIS — M542 Cervicalgia: Secondary | ICD-10-CM

## 2022-04-10 DIAGNOSIS — G8929 Other chronic pain: Secondary | ICD-10-CM

## 2022-04-10 NOTE — Patient Instructions (Addendum)
Exercises 3x a week ?Tart cherry extract '1200mg'$  nightly ?PT [place] ?See me 6-8 weeks ?

## 2022-04-10 NOTE — Assessment & Plan Note (Signed)
Patient has significant tightness noted.  X-rays today show the patient does have some mild degenerative disc disease but seems to be worse at L2-L3.  Patient does have significant tightness of the hip flexors and will work as well.  Discussed icing regimen and home exercises, discussed which activities to do and which ones to avoid, increase activity slowly otherwise.  We discussed different medications which patient declined and wanted to try to keep it as natural as possible follow-up with me again in 6 to 8 weeks otherwise. ?

## 2022-04-10 NOTE — Assessment & Plan Note (Signed)

## 2022-04-15 ENCOUNTER — Encounter: Payer: Self-pay | Admitting: Physical Therapy

## 2022-04-15 ENCOUNTER — Ambulatory Visit: Payer: BC Managed Care – PPO | Attending: Family Medicine | Admitting: Physical Therapy

## 2022-04-15 DIAGNOSIS — M545 Low back pain, unspecified: Secondary | ICD-10-CM | POA: Diagnosis not present

## 2022-04-15 DIAGNOSIS — M542 Cervicalgia: Secondary | ICD-10-CM | POA: Insufficient documentation

## 2022-04-15 DIAGNOSIS — M25551 Pain in right hip: Secondary | ICD-10-CM | POA: Insufficient documentation

## 2022-04-15 DIAGNOSIS — M25552 Pain in left hip: Secondary | ICD-10-CM | POA: Diagnosis not present

## 2022-04-15 DIAGNOSIS — R252 Cramp and spasm: Secondary | ICD-10-CM | POA: Insufficient documentation

## 2022-04-15 DIAGNOSIS — M5459 Other low back pain: Secondary | ICD-10-CM

## 2022-04-15 NOTE — Therapy (Signed)
?OUTPATIENT PHYSICAL THERAPY THORACOLUMBAR EVALUATION ? ? ?Patient Name: Jill [name] ?MRN: [id number] ?DOB:16-Jan-1956, 66 y.o., female ?Today's Date: 04/15/2022 ? ? PT End of Session - 04/15/22 1334   ? ? Visit Number 1   ? Date for PT Re-Evaluation 06/10/22   ? Authorization Type BCBS   ? PT Start Time 1145   ? PT Stop Time 1235   ? PT Time Calculation (min) 50 min   ? Activity Tolerance Patient tolerated treatment well   ? Behavior During Therapy [facility] for tasks assessed/performed   ? ?  ?  ? ?  ? ? ?Past Medical History:  ?Diagnosis Date  ? Arthritis   ? "hands" (03/24/2017)  ? Colon polyp   ? High cholesterol   ? Multifocal atrial tachycardia (HCC)   ? PAT (paroxysmal atrial tachycardia) ([place])   ? [name] 03/24/2017  ? Peptic ulcer   ? "when I was a child"  ? PONV (postoperative nausea and vomiting)   ? "bad when they did my wisdom teeth"  ? Pre-diabetes   ? Seasonal allergies   ? ?Past Surgical History:  ?Procedure Laterality Date  ? CERVICAL CERCLAGE  X 2  ? COLONOSCOPY W/ BIOPSIES AND POLYPECTOMY    ? TONSILLECTOMY    ? WISDOM TOOTH EXTRACTION    ? ?Patient Active Problem List  ? Diagnosis Date Noted  ? Low back pain 04/10/2022  ? Somatic dysfunction of spine, sacral 04/10/2022  ? Adrenal nodule ([place]) 03/31/2017  ? Pancreatic cyst 03/31/2017  ? Chest pain 03/31/2017  ? Prediabetes 03/31/2017  ? Abdominal pain   ? Pancreatitis 03/27/2017  ? Hypertensive urgency 03/24/2017  ? Sinusitis 03/24/2017  ? Leukocytosis 03/24/2017  ? Headache 03/24/2017  ? Hyperglycemia 03/24/2017  ? Atrial tachycardia, paroxysmal ([place]) 03/24/2017  ? HLD (hyperlipidemia) 10/29/2016  ? ? ?REFERRING PROVIDER: Lyndal Pulley, DO  ? ?REFERRING DIAG: M54.2 (ICD-10-CM) - Cervical spine pain M54.50 (ICD-10-CM) - Lumbar spine pain M25.551,M25.552 (ICD-10-CM) - Bilateral hip pain  ? ?THERAPY DIAG:  ?Other low back pain ? ?Pain in left hip ? ?Pain in right hip ? ?Cramp and spasm ? ? ?ONSET DATE: chronic since the 1990s, worsened in Dec  2022 ? ?SUBJECTIVE:                                                                                                                                                                                          ? 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  She has ongoing carpal tunnel symptoms as well with minimal neck pain but  wants to focus on LBP/bil hips today to be able to ride horse again. ? ?PERTINENT HISTORY:  ?N/A ? ?PAIN:  ?Are you having pain? Yes ?NPRS scale: 1-6/10 with activity, sitting is a 0/10 for back and hips ?Pain location: central lumbar and buttocks, deep hip pain in sockets ?Pain orientation: Right, Left, Posterior, and Lower  ?PAIN TYPE: burning and sharp ?Pain description: intermittent  ?Aggravating factors: can't ride horse due to pain, squatting (feels weak), walking ?Relieving factors: sitting, OTC meds, seated straddle stretch and stretches from Dr. Tamala Julian ? ?PRECAUTIONS: None ? ?WEIGHT BEARING RESTRICTIONS No ? ?FALLS:  ?Has patient fallen in last 6 months? No ? ?LIVING ENVIRONMENT: ?Lives with: lives with their spouse ?Lives in: House/apartment ?Stairs: Yes: External: 3 steps; none ?Has following equipment at home: None ? ?OCCUPATION: no ? ?PLOF: Independent ? ?PATIENT GOALS be able to ride my horse without signif pain, hasn't ridden since March ? ? ?OBJECTIVE:  ? ?DIAGNOSTIC FINDINGS:  ?Cervical spondylosis with bony spurs from C2-C7 levels ?Moderate to marked degenerative changes are noted in both hips, more severe on the left side ?Degenerative changes are noted with bony spurs, facet hypertrophy and disc space narrowing  ?      at multiple levels in the lumbar spine and lower thoracic spine. ? ?PATIENT SURVEYS:  ?FOTO 55% goal 62% ? ?COGNITION: ? Overall cognitive status: Within functional limits for tasks assessed   ?  ?SENSATION: ?WFL, denies pins/needles/numbness in [place], has some in bil hands ? ?MUSCLE  LENGTH: ?Limited adductors bil, limited Rt quad, limited end range bil piriformis and gluteals, limited end range hamstrings ? ?POSTURE:  ?Reduced thoracic kyphosis and lumbar lordosis ? ?PALPATION: ?Rt thorcolumbar paraspinals, bil adductors tender and tight, bil piriformis ? ? ?LUMBAR ROM:  ?Full ROM with pain noted below ?Active  A/PROM  ?04/15/2022  ?Flexion Fingers to toes  ?Extension Mild central LBP on ext  ?Right lateral flexion Rt sided pain  ?Left lateral flexion Rt sided stretch  ?Right rotation Full no pain  ?Left rotation Full no pain  ?Bil thoracic seated rotation 50% bil  ? (Blank rows = not tested) ? ?LE ROM: ? ?Passive  Right ?04/15/2022 Left ?04/15/2022  ?Hip flexion  100  ?Hip extension    ?Hip abduction 37 40  ?Hip adduction    ?Hip internal rotation 6 deg, no pain 3 deg, pain  ?Hip external rotation  WNL  ?Knee flexion    ?Knee extension    ?Ankle dorsiflexion    ?Ankle plantarflexion    ?Ankle inversion    ?Ankle eversion    ? (Blank rows = not tested) ? ?LE MMT: ? 5/5 all LE muscle groups with exception of hip flexors ? ?MMT Right ?04/15/2022 Left ?04/15/2022  ?Hip flexion 4+/5 4-/5  ?Hip extension    ?Hip abduction    ?Hip adduction    ?Hip internal rotation    ?Hip external rotation    ?Knee flexion    ?Knee extension    ?Ankle dorsiflexion    ?Ankle plantarflexion    ?Ankle inversion    ?Ankle eversion    ? (Blank rows = not tested) ? ?JOINT MOBILITY: ?Limited Lt SI joint mobility compared to Rt ? ?FUNCTIONAL TESTS:  ?Able to squat, balance in SLS ? ?GAIT: ?Comments: WNL ? ? ? ?TODAY'S TREATMENT  ?04/15/22: initiated HEP see below ? ?PATIENT EDUCATION:  ?Education details: Access Code: [email or web address] ?Person educated: Patient ?Education method: Explanation, Demonstration, and Handouts ?Education comprehension: verbalized understanding ? ? ?HOME  EXERCISE PROGRAM: ?Access Code: [email or web address] ?URL: [email or web address] ?Date: 04/15/2022 ?Prepared by: Venetia Night Maren Wiesen ? ?Exercises ?- Sidelying  Thoracic Rotation with Open Book  - 1 x daily - 7 x weekly - 1 sets - 5 reps - 10 hold ?- Supine Piriformis Stretch with Foot on Ground  - 1 x daily - 7 x weekly - 1 sets - 2 reps - 30 hold ?- Supine Hip Internal and External Rotation  - 1 x daily - 7 x weekly - 1 sets - 10 reps ?- Supine Hip Internal and External Rotation  - 1 x daily - 7 x weekly - 1 sets - 20 reps ?- Supine Lower Trunk Rotation  - 1 x daily - 7 x weekly - 13 sets - 3 reps - 10 hold ?- Butterfly Groin Stretch  - 1 x daily - 7 x weekly - 1 sets - 3 reps - 30 hold ? ? ?ASSESSMENT: ? ?CLINICAL IMPRESSION: ?Patient is a 66 y.o. female who was seen today for physical therapy evaluation and treatment for chronic LBP and bil buttock/hip pain.  She has extensive arthritis and limited LE flexibility, notably in adductors, Rt quad, bil piriformis and bil hamstrings.  She has limited bil hip IR and abduction.  LE strength is 5/5 with exception of weak hip flexors.  She has a goal to return to riding her horse.  Stretches to date from MD have been helping.  PT progressed targeted stretches and ROM of bil hips for initial HEP. ? ? ?OBJECTIVE IMPAIRMENTS decreased mobility, decreased ROM, decreased strength, hypomobility, increased muscle spasms, impaired flexibility, postural dysfunction, and pain.  ? ?ACTIVITY LIMITATIONS community activity.  ? ?PERSONAL FACTORS Time since onset of injury/illness/exacerbation are also affecting patient's functional outcome.  ? ? ?REHAB POTENTIAL: Excellent ? ?CLINICAL DECISION MAKING: Stable/uncomplicated ? ?EVALUATION COMPLEXITY: Low ? ? ?GOALS: ?Goals reviewed with patient? Yes ? ?SHORT TERM GOALS: Target date: 05/13/2022    (Remove Blue Hyperlink) ? ?Pt will be ind with initial HEP ?Baseline: ?Goal status: INITIAL ? ?2.  Pt will improve LE flexibility of bil adductors to allow hip abd to at least 50 deg. ?Baseline:  ?Goal status: INITIAL ? ?3.  Pt will improve Lt hip flexor strength to at least 4+/5. ?Baseline:  ?Goal  status: INITIAL ? ?4.  Pt will report being able to get on/off horse and ride for at least 10 min with min pain ?Baseline:  ?Goal status: INITIAL ? ? ?LONG TERM GOALS: Target date: 06/10/2022  (Remove Blue Hyperlink) ? ?Pt will b

## 2022-04-23 ENCOUNTER — Encounter: Payer: Self-pay | Admitting: Rehabilitative and Restorative Service Providers"

## 2022-04-23 ENCOUNTER — Ambulatory Visit: Payer: BC Managed Care – PPO | Admitting: Rehabilitative and Restorative Service Providers"

## 2022-04-23 DIAGNOSIS — M25552 Pain in left hip: Secondary | ICD-10-CM

## 2022-04-23 DIAGNOSIS — R252 Cramp and spasm: Secondary | ICD-10-CM

## 2022-04-23 DIAGNOSIS — M542 Cervicalgia: Secondary | ICD-10-CM | POA: Diagnosis not present

## 2022-04-23 DIAGNOSIS — M25551 Pain in right hip: Secondary | ICD-10-CM

## 2022-04-23 DIAGNOSIS — M5459 Other low back pain: Secondary | ICD-10-CM

## 2022-04-23 NOTE — Therapy (Signed)
OUTPATIENT PHYSICAL THERAPY THORACOLUMBAR EVALUATION   Patient Name: Jill [name] MRN: [id number] DOB:[birth year], 66 y.o., female Today's Date: 04/23/2022   PT End of Session - 04/23/22 1449     Visit Number 2    Date for PT Re-Evaluation 06/10/22    Authorization Type BCBS    PT Start Time 1446    PT Stop Time 1525    PT Time Calculation (min) 39 min    Activity Tolerance Patient tolerated treatment well    Behavior During Therapy WFL for tasks assessed/performed             Past Medical History:  Diagnosis Date   Arthritis    "hands" (03/24/2017)   Colon polyp    High cholesterol    Multifocal atrial tachycardia (HCC)    PAT (paroxysmal atrial tachycardia) ([place])    [name] 03/24/2017   Peptic ulcer    "when I was a child"   PONV (postoperative nausea and vomiting)    "bad when they did my wisdom teeth"   Pre-diabetes    Seasonal allergies    Past Surgical History:  Procedure Laterality Date   CERVICAL CERCLAGE  X 2   COLONOSCOPY W/ BIOPSIES AND POLYPECTOMY     TONSILLECTOMY     WISDOM TOOTH EXTRACTION     Patient Active Problem List   Diagnosis Date Noted   Low back pain 04/10/2022   Somatic dysfunction of spine, sacral 04/10/2022   Adrenal nodule ([place]) 03/31/2017   Pancreatic cyst 03/31/2017   Chest pain 03/31/2017   Prediabetes 03/31/2017   Abdominal pain    Pancreatitis 03/27/2017   Hypertensive urgency 03/24/2017   Sinusitis 03/24/2017   Leukocytosis 03/24/2017   Headache 03/24/2017   Hyperglycemia 03/24/2017   Atrial tachycardia, paroxysmal ([place]) 03/24/2017   HLD (hyperlipidemia) 10/29/2016    REFERRING PROVIDER: Lyndal Pulley, DO   REFERRING DIAG: M54.2 (ICD-10-CM) - Cervical spine pain M54.50 (ICD-10-CM) - Lumbar spine pain M25.551,M25.552 (ICD-10-CM) - Bilateral hip pain   THERAPY DIAG:  Other low back pain  Pain in left hip  Pain in right hip  Cramp and spasm   ONSET DATE: chronic since the 1990s, worsened in Dec  2022  SUBJECTIVE:                                                                                                                                                                                           SUBJECTIVE STATEMENT: Pt reports that homework has been going "okay, some of them on the left side are harder to do".  PERTINENT HISTORY:  paroxysmal atrial tachycardia, OA in hands  PAIN:  Are  you having pain? Yes NPRS scale: 3/10 with activity Pain location: central lumbar and buttocks/left leg Pain orientation: Right, Left, Posterior, and Lower  PAIN TYPE: burning and sharp Pain description: intermittent  Aggravating factors: can't ride horse due to pain, squatting (feels weak), walking Relieving factors: sitting, OTC meds, seated straddle stretch and stretches from Dr. Tamala Julian  PATIENT GOALS be able to ride my horse without signif pain, hasn't ridden since March  Rationale for Evaluation and Treatment Rehabilitation    OBJECTIVE:   DIAGNOSTIC FINDINGS:  Cervical spondylosis with bony spurs from C2-C7 levels Moderate to marked degenerative changes are noted in both hips, more severe on the left side Degenerative changes are noted with bony spurs, facet hypertrophy and disc space narrowing        at multiple levels in the lumbar spine and lower thoracic spine.  PATIENT SURVEYS:  04/15/2022:  FOTO 55% goal 62%  SENSATION: WFL, denies pins/needles/numbness in [place], has some in bil hands  MUSCLE LENGTH: Limited adductors bil, limited Rt quad, limited end range bil piriformis and gluteals, limited end range hamstrings  POSTURE:  Reduced thoracic kyphosis and lumbar lordosis  PALPATION: Rt thorcolumbar paraspinals, bil adductors tender and tight, bil piriformis   LUMBAR ROM:  Full ROM with pain noted below Active  A/PROM  04/15/2022  Flexion Fingers to toes  Extension Mild central LBP on ext  Right lateral flexion Rt sided pain  Left lateral flexion Rt sided stretch   Right rotation Full no pain  Left rotation Full no pain  Bil thoracic seated rotation 50% bil   (Blank rows = not tested)  LE ROM:  Passive  Right 04/15/2022 Left 04/15/2022  Hip flexion  100  Hip extension    Hip abduction 37 40  Hip adduction    Hip internal rotation 6 deg, no pain 3 deg, pain  Hip external rotation  WNL  Knee flexion    Knee extension    Ankle dorsiflexion    Ankle plantarflexion    Ankle inversion    Ankle eversion     (Blank rows = not tested)  LE MMT:  5/5 all LE muscle groups with exception of hip flexors  MMT Right 04/15/2022 Left 04/15/2022  Hip flexion 4+/5 4-/5  Hip extension    Hip abduction    Hip adduction    Hip internal rotation    Hip external rotation    Knee flexion    Knee extension    Ankle dorsiflexion    Ankle plantarflexion    Ankle inversion    Ankle eversion     (Blank rows = not tested)  JOINT MOBILITY: Limited Lt SI joint mobility compared to Rt  FUNCTIONAL TESTS:  Able to squat, balance in SLS   TODAY'S TREATMENT   04/23/2022: Nustep level 1 x6 min with PT present to discuss status 3 way green pball rollout 5x10 sec each Seated piriformis stretch 2x20 sec bilat Standing hamstring stretch on 2nd step 2x20 each Standing hip flexor stretch on steps 2x20 sec each Seated butterfly groin stretch 2x20 sec bilat Prone quad stretch with strap 2x20 sec bilat Prone hip extension, prone press up.  2x10 bilat Lower trunk rotation 5x10 sec bilat  04/15/22: initiated HEP see below  PATIENT EDUCATION:  Education details: Access Code: [email or web address] Person educated: Patient Education method: Explanation, Demonstration, and Handouts Education comprehension: verbalized understanding   HOME EXERCISE PROGRAM: Access Code: [email or web address] URL: [email or web address] Date: 04/15/2022 Prepared by: Baruch Merl  Exercises - Sidelying Thoracic Rotation  with Open Book  - 1 x daily - 7 x weekly - 1 sets - 5 reps - 10  hold - Supine Piriformis Stretch with Foot on Ground  - 1 x daily - 7 x weekly - 1 sets - 2 reps - 30 hold - Supine Hip Internal and External Rotation  - 1 x daily - 7 x weekly - 1 sets - 10 reps - Supine Hip Internal and External Rotation  - 1 x daily - 7 x weekly - 1 sets - 20 reps - Supine Lower Trunk Rotation  - 1 x daily - 7 x weekly - 13 sets - 3 reps - 10 hold - Butterfly Groin Stretch  - 1 x daily - 7 x weekly - 1 sets - 3 reps - 30 hold   ASSESSMENT:  CLINICAL IMPRESSION: Ms Palau presents to PT with compliance with HEP and eager to participate.  Pt able to progress with strengthening and stretching during session.  Pt continues to have difficulty with butterfly groin stretch. Pt only requires min cuing throughout for technique and mechanics.  Pt continues to require skilled PT to progress towards goal related activities.   OBJECTIVE IMPAIRMENTS decreased mobility, decreased ROM, decreased strength, hypomobility, increased muscle spasms, impaired flexibility, postural dysfunction, and pain.   ACTIVITY LIMITATIONS community activity.   PERSONAL FACTORS Time since onset of injury/illness/exacerbation are also affecting patient's functional outcome.    REHAB POTENTIAL: Excellent  CLINICAL DECISION MAKING: Stable/uncomplicated  EVALUATION COMPLEXITY: Low   GOALS: Goals reviewed with patient? Yes  SHORT TERM GOALS: Target date: 05/16/2022     Pt will be ind with initial HEP Baseline: Goal status: IN PROGRESS  2.  Pt will improve LE flexibility of bil adductors to allow hip abd to at least 50 deg. Baseline:  Goal status: INITIAL  3.  Pt will improve Lt hip flexor strength to at least 4+/5. Baseline:  Goal status: INITIAL  4.  Pt will report being able to get on/off horse and ride for at least 10 min with min pain Baseline:  Goal status: INITIAL   LONG TERM GOALS: Target date: 06/10/2022    Pt will be ind with advanced HEP and understand how to safely  progress Baseline:  Goal status: INITIAL  2.  Pt will score at least 62% on FOTO to demo improved function. Baseline: 55% Goal status: INITIAL  3.  Pt will normalize LE flexibility to reduce undue strain on back and pelvis and allow more comfortable positioning on horse. Baseline:  Goal status: INITIAL  4.  Pt will return to riding her horse with min pain. Baseline:  Goal status: INITIAL  5.  Pt will report at least 60% reduction in pain with daily tasks. Baseline:  Goal status: INITIAL    PLAN: PT FREQUENCY: 1x/week  PT DURATION: 8 weeks  PLANNED INTERVENTIONS: Therapeutic exercises, Therapeutic activity, Neuromuscular re-education, Patient/Family education, Joint mobilization, Dry Needling, Electrical stimulation, Spinal mobilization, Cryotherapy, Moist heat, Taping, Ionotophoresis '4mg'$ /ml Dexamethasone, and Manual therapy.  PLAN FOR NEXT SESSION: review HEP, progress hip flexor and core strength, add Rt quad stretch and Rt lumbar stretch (seated ball rollouts or child's pose?)   Juel Burrow, PT 04/23/22 3:40 PM  [facility] [address], [place], [place] [address] Phone # [phone] Fax [phone]

## 2022-04-29 ENCOUNTER — Encounter: Payer: Self-pay | Admitting: Rehabilitative and Restorative Service Providers"

## 2022-04-29 ENCOUNTER — Ambulatory Visit: Payer: BC Managed Care – PPO | Admitting: Rehabilitative and Restorative Service Providers"

## 2022-04-29 DIAGNOSIS — M5459 Other low back pain: Secondary | ICD-10-CM

## 2022-04-29 DIAGNOSIS — M542 Cervicalgia: Secondary | ICD-10-CM | POA: Diagnosis not present

## 2022-04-29 DIAGNOSIS — R252 Cramp and spasm: Secondary | ICD-10-CM

## 2022-04-29 DIAGNOSIS — M25552 Pain in left hip: Secondary | ICD-10-CM

## 2022-04-29 DIAGNOSIS — M25551 Pain in right hip: Secondary | ICD-10-CM

## 2022-04-29 NOTE — Therapy (Signed)
OUTPATIENT PHYSICAL THERAPY THORACOLUMBAR EVALUATION   Patient Name: Jill [name] MRN: [id number] DOB:07/10/1956, 66 y.o., female Today's Date: 04/29/2022   PT End of Session - 04/29/22 1531     Visit Number 3    Date for PT Re-Evaluation 06/10/22    Authorization Type BCBS    PT Start Time 1530    PT Stop Time 1610    PT Time Calculation (min) 40 min    Activity Tolerance Patient tolerated treatment well    Behavior During Therapy WFL for tasks assessed/performed             Past Medical History:  Diagnosis Date   Arthritis    "hands" (03/24/2017)   Colon polyp    High cholesterol    Multifocal atrial tachycardia (HCC)    PAT (paroxysmal atrial tachycardia) ([place])    [name] 03/24/2017   Peptic ulcer    "when I was a child"   PONV (postoperative nausea and vomiting)    "bad when they did my wisdom teeth"   Pre-diabetes    Seasonal allergies    Past Surgical History:  Procedure Laterality Date   CERVICAL CERCLAGE  X 2   COLONOSCOPY W/ BIOPSIES AND POLYPECTOMY     TONSILLECTOMY     WISDOM TOOTH EXTRACTION     Patient Active Problem List   Diagnosis Date Noted   Low back pain 04/10/2022   Somatic dysfunction of spine, sacral 04/10/2022   Adrenal nodule ([place]) 03/31/2017   Pancreatic cyst 03/31/2017   Chest pain 03/31/2017   Prediabetes 03/31/2017   Abdominal pain    Pancreatitis 03/27/2017   Hypertensive urgency 03/24/2017   Sinusitis 03/24/2017   Leukocytosis 03/24/2017   Headache 03/24/2017   Hyperglycemia 03/24/2017   Atrial tachycardia, paroxysmal ([place]) 03/24/2017   HLD (hyperlipidemia) 10/29/2016    REFERRING PROVIDER: Lyndal Pulley, DO   REFERRING DIAG: M54.2 (ICD-10-CM) - Cervical spine pain M54.50 (ICD-10-CM) - Lumbar spine pain M25.551,M25.552 (ICD-10-CM) - Bilateral hip pain   THERAPY DIAG:  Other low back pain  Pain in left hip  Pain in right hip  Cramp and spasm   ONSET DATE: chronic since the 1990s, worsened in Dec  2022  SUBJECTIVE:                                                                                                                                                                                           SUBJECTIVE STATEMENT: Pt reports that she has been having increased left leg pain today, but still having some pain in her back.  PERTINENT HISTORY:  paroxysmal atrial tachycardia, OA in hands  PAIN:  Are you having pain? Yes NPRS scale: 5/10 with activity Pain location: central lumbar and buttocks/left leg Pain orientation: Right, Left, Posterior, and Lower  PAIN TYPE: burning and sharp Pain description: intermittent  Aggravating factors: can't ride horse due to pain, squatting (feels weak), walking Relieving factors: sitting, OTC meds, seated straddle stretch and stretches from Dr. Tamala Julian  PATIENT GOALS be able to ride my horse without signif pain, hasn't ridden since March  Rationale for Evaluation and Treatment Rehabilitation    OBJECTIVE:   DIAGNOSTIC FINDINGS:  Cervical spondylosis with bony spurs from C2-C7 levels Moderate to marked degenerative changes are noted in both hips, more severe on the left side Degenerative changes are noted with bony spurs, facet hypertrophy and disc space narrowing        at multiple levels in the lumbar spine and lower thoracic spine.  PATIENT SURVEYS:  04/15/2022:  FOTO 55% goal 62%  SENSATION: WFL, denies pins/needles/numbness in [place], has some in bil hands  MUSCLE LENGTH: Limited adductors bil, limited Rt quad, limited end range bil piriformis and gluteals, limited end range hamstrings  POSTURE:  Reduced thoracic kyphosis and lumbar lordosis  PALPATION: Rt thorcolumbar paraspinals, bil adductors tender and tight, bil piriformis   LUMBAR ROM:  Full ROM with pain noted below Active  A/PROM  04/15/2022  Flexion Fingers to toes  Extension Mild central LBP on ext  Right lateral flexion Rt sided pain  Left lateral flexion Rt  sided stretch  Right rotation Full no pain  Left rotation Full no pain  Bil thoracic seated rotation 50% bil   (Blank rows = not tested)  LE ROM:  Passive  Right 04/15/2022 Left 04/15/2022  Hip flexion  100  Hip extension    Hip abduction 37 40  Hip adduction    Hip internal rotation 6 deg, no pain 3 deg, pain  Hip external rotation  WNL  Knee flexion    Knee extension    Ankle dorsiflexion    Ankle plantarflexion    Ankle inversion    Ankle eversion     (Blank rows = not tested)  LE MMT:  5/5 all LE muscle groups with exception of hip flexors  MMT Right 04/15/2022 Left 04/15/2022  Hip flexion 4+/5 4-/5  Hip extension    Hip abduction    Hip adduction    Hip internal rotation    Hip external rotation    Knee flexion    Knee extension    Ankle dorsiflexion    Ankle plantarflexion    Ankle inversion    Ankle eversion     (Blank rows = not tested)  JOINT MOBILITY: Limited Lt SI joint mobility compared to Rt  FUNCTIONAL TESTS:  Able to squat, balance in SLS   TODAY'S TREATMENT   04/29/2022: Nustep level 5 x6 min with PT present to discuss status 3 way green pball rollout 5x10 sec each Seated piriformis stretch 2x20 sec bilat Standing hamstring stretch on 2nd step 2x20 sec bilat Standing hip flexor stretch on steps 2x20 sec each Seated butterfly groin stretch 2x20 sec bilat Prone: HS curl, hip extension, prone press up.  2x10 bilat Lower trunk rotation 5x10 sec bilat Hooklying:  posterior pelvic tilt, PPT with marching.  2x10 each  04/23/2022: Nustep level 1 x6 min with PT present to discuss status 3 way green pball rollout 5x10 sec each Seated piriformis stretch 2x20 sec bilat Standing hamstring stretch on 2nd step 2x20 each Standing hip flexor stretch on steps 2x20 sec  each Seated butterfly groin stretch 2x20 sec bilat Prone quad stretch with strap 2x20 sec bilat Prone hip extension, prone press up.  2x10 bilat Lower trunk rotation 5x10 sec  bilat  04/15/22: initiated HEP see below  PATIENT EDUCATION:  Education details: Access Code: [email or web address] Person educated: Patient Education method: Explanation, Demonstration, and Handouts Education comprehension: verbalized understanding   HOME EXERCISE PROGRAM: Access Code: [email or web address] URL: [email or web address] Date: 04/15/2022 Prepared by: Venetia Night Beuhring  Exercises - Sidelying Thoracic Rotation with Open Book  - 1 x daily - 7 x weekly - 1 sets - 5 reps - 10 hold - Supine Piriformis Stretch with Foot on Ground  - 1 x daily - 7 x weekly - 1 sets - 2 reps - 30 hold - Supine Hip Internal and External Rotation  - 1 x daily - 7 x weekly - 1 sets - 10 reps - Supine Hip Internal and External Rotation  - 1 x daily - 7 x weekly - 1 sets - 20 reps - Supine Lower Trunk Rotation  - 1 x daily - 7 x weekly - 13 sets - 3 reps - 10 hold - Butterfly Groin Stretch  - 1 x daily - 7 x weekly - 1 sets - 3 reps - 30 hold   ASSESSMENT:  CLINICAL IMPRESSION: Jill [name] presents to PT with compliance with HEP and eager to participate.  Pt reports that she felt a lot better after her PT appointment last week and was able to go with her grandson to the [employer].  She reports that she has gradually started having some additional pain.  Pt continues to report improved flexibility following PT session today, but does state that she is still having some left hip/groin pain.  Pt continues to require skilled PT to progress towards go related activities.   OBJECTIVE IMPAIRMENTS decreased mobility, decreased ROM, decreased strength, hypomobility, increased muscle spasms, impaired flexibility, postural dysfunction, and pain.   ACTIVITY LIMITATIONS community activity.   PERSONAL FACTORS Time since onset of injury/illness/exacerbation are also affecting patient's functional outcome.    REHAB POTENTIAL: Excellent  CLINICAL DECISION MAKING: Stable/uncomplicated  EVALUATION COMPLEXITY:  Low   GOALS: Goals reviewed with patient? Yes  SHORT TERM GOALS: Target date: 05/16/2022     Pt will be ind with initial HEP Baseline: Goal status: IN PROGRESS  2.  Pt will improve LE flexibility of bil adductors to allow hip abd to at least 50 deg. Baseline:  Goal status: INITIAL  3.  Pt will improve Lt hip flexor strength to at least 4+/5. Baseline:  Goal status: INITIAL  4.  Pt will report being able to get on/off horse and ride for at least 10 min with min pain Baseline:  Goal status: INITIAL   LONG TERM GOALS: Target date: 06/10/2022    Pt will be ind with advanced HEP and understand how to safely progress Baseline:  Goal status: INITIAL  2.  Pt will score at least 62% on FOTO to demo improved function. Baseline: 55% Goal status: INITIAL  3.  Pt will normalize LE flexibility to reduce undue strain on back and pelvis and allow more comfortable positioning on horse. Baseline:  Goal status: INITIAL  4.  Pt will return to riding her horse with min pain. Baseline:  Goal status: INITIAL  5.  Pt will report at least 60% reduction in pain with daily tasks. Baseline:  Goal status: INITIAL    PLAN: PT FREQUENCY: 1x/week  PT DURATION: 8 weeks  PLANNED  INTERVENTIONS: Therapeutic exercises, Therapeutic activity, Neuromuscular re-education, Patient/Family education, Joint mobilization, Dry Needling, Electrical stimulation, Spinal mobilization, Cryotherapy, Moist heat, Taping, Ionotophoresis '4mg'$ /ml Dexamethasone, and Manual therapy.  PLAN FOR NEXT SESSION: review HEP, progress hip flexor and core strength, add Rt quad stretch and Rt lumbar stretch (seated ball rollouts or child's pose?)   Juel Burrow, PT 04/29/22 4:16 PM  [facility] [address] [place], [place] [address] Phone # [phone] Fax [phone]

## 2022-05-06 ENCOUNTER — Encounter: Payer: Self-pay | Admitting: Rehabilitative and Restorative Service Providers"

## 2022-05-06 ENCOUNTER — Ambulatory Visit: Payer: BC Managed Care – PPO | Attending: Family Medicine | Admitting: Rehabilitative and Restorative Service Providers"

## 2022-05-06 DIAGNOSIS — M25551 Pain in right hip: Secondary | ICD-10-CM | POA: Insufficient documentation

## 2022-05-06 DIAGNOSIS — R252 Cramp and spasm: Secondary | ICD-10-CM | POA: Insufficient documentation

## 2022-05-06 DIAGNOSIS — M25552 Pain in left hip: Secondary | ICD-10-CM | POA: Insufficient documentation

## 2022-05-06 DIAGNOSIS — M5459 Other low back pain: Secondary | ICD-10-CM | POA: Insufficient documentation

## 2022-05-06 NOTE — Therapy (Signed)
OUTPATIENT PHYSICAL THERAPY THORACOLUMBAR EVALUATION   Patient Name: Jill [name] MRN: [id number] DOB:1956-11-10, 66 y.o., female Today's Date: 05/06/2022   PT End of Session - 05/06/22 1525     Visit Number 4    Date for PT Re-Evaluation 06/10/22    Authorization Type BCBS    PT Start Time 1525    PT Stop Time 1605    PT Time Calculation (min) 40 min    Activity Tolerance Patient tolerated treatment well    Behavior During Therapy WFL for tasks assessed/performed             Past Medical History:  Diagnosis Date   Arthritis    "hands" (03/24/2017)   Colon polyp    High cholesterol    Multifocal atrial tachycardia (HCC)    PAT (paroxysmal atrial tachycardia) ([place])    [name] 03/24/2017   Peptic ulcer    "when I was a child"   PONV (postoperative nausea and vomiting)    "bad when they did my wisdom teeth"   Pre-diabetes    Seasonal allergies    Past Surgical History:  Procedure Laterality Date   CERVICAL CERCLAGE  X 2   COLONOSCOPY W/ BIOPSIES AND POLYPECTOMY     TONSILLECTOMY     WISDOM TOOTH EXTRACTION     Patient Active Problem List   Diagnosis Date Noted   Low back pain 04/10/2022   Somatic dysfunction of spine, sacral 04/10/2022   Adrenal nodule ([place]) 03/31/2017   Pancreatic cyst 03/31/2017   Chest pain 03/31/2017   Prediabetes 03/31/2017   Abdominal pain    Pancreatitis 03/27/2017   Hypertensive urgency 03/24/2017   Sinusitis 03/24/2017   Leukocytosis 03/24/2017   Headache 03/24/2017   Hyperglycemia 03/24/2017   Atrial tachycardia, paroxysmal ([place]) 03/24/2017   HLD (hyperlipidemia) 10/29/2016    REFERRING PROVIDER: Lyndal Pulley, DO   REFERRING DIAG: M54.2 (ICD-10-CM) - Cervical spine pain M54.50 (ICD-10-CM) - Lumbar spine pain M25.551,M25.552 (ICD-10-CM) - Bilateral hip pain   THERAPY DIAG:  Other low back pain  Pain in left hip  Pain in right hip  Cramp and spasm   ONSET DATE: chronic since the 1990s, worsened in Dec  2022  SUBJECTIVE:                                                                                                                                                                                           SUBJECTIVE STATEMENT: Pt reports that she is having some increased pain in her back.  PERTINENT HISTORY:  paroxysmal atrial tachycardia, OA in hands  PAIN:  Are you having pain? Yes NPRS scale: 6/10  with activity Pain location: central lumbar Pain orientation: Right, Left, Posterior, and Lower  PAIN TYPE: burning and sharp Pain description: intermittent  Aggravating factors: can't ride horse due to pain, squatting (feels weak), walking Relieving factors: sitting, OTC meds, seated straddle stretch and stretches from Dr. Tamala Julian  PATIENT GOALS be able to ride my horse without signif pain, hasn't ridden since March  Rationale for Evaluation and Treatment Rehabilitation    OBJECTIVE:   DIAGNOSTIC FINDINGS:  Cervical spondylosis with bony spurs from C2-C7 levels Moderate to marked degenerative changes are noted in both hips, more severe on the left side Degenerative changes are noted with bony spurs, facet hypertrophy and disc space narrowing        at multiple levels in the lumbar spine and lower thoracic spine.  PATIENT SURVEYS:  04/15/2022:  FOTO 55% goal 62%  SENSATION: WFL, denies pins/needles/numbness in [place], has some in bil hands  MUSCLE LENGTH: Limited adductors bil, limited Rt quad, limited end range bil piriformis and gluteals, limited end range hamstrings  POSTURE:  Reduced thoracic kyphosis and lumbar lordosis  PALPATION: Rt thorcolumbar paraspinals, bil adductors tender and tight, bil piriformis   LUMBAR ROM:  Full ROM with pain noted below Active  A/PROM  04/15/2022  Flexion Fingers to toes  Extension Mild central LBP on ext  Right lateral flexion Rt sided pain  Left lateral flexion Rt sided stretch  Right rotation Full no pain  Left rotation Full no  pain  Bil thoracic seated rotation 50% bil   (Blank rows = not tested)  LE ROM:  Passive  Right 04/15/2022 Left 04/15/2022  Hip flexion  100  Hip extension    Hip abduction 37 40  Hip adduction    Hip internal rotation 6 deg, no pain 3 deg, pain  Hip external rotation  WNL  Knee flexion    Knee extension    Ankle dorsiflexion    Ankle plantarflexion    Ankle inversion    Ankle eversion     (Blank rows = not tested)  LE MMT:  5/5 all LE muscle groups with exception of hip flexors  MMT Right 04/15/2022 Left 04/15/2022  Hip flexion 4+/5 4-/5  Hip extension    Hip abduction    Hip adduction    Hip internal rotation    Hip external rotation    Knee flexion    Knee extension    Ankle dorsiflexion    Ankle plantarflexion    Ankle inversion    Ankle eversion     (Blank rows = not tested)  JOINT MOBILITY: Limited Lt SI joint mobility compared to Rt  FUNCTIONAL TESTS:  Able to squat, balance in SLS   TODAY'S TREATMENT   05/06/2022: Nustep level 5 x6 min (452 steps) with PT present to discuss status 3 way green pball rollout 5x10 sec each Seated on dynadisc 4 way pelvic tilt x10 each Seated hamstring stretch with leg on mat 2x20 sec bilat Seated butterfly groin stretch 2x20 sec bilat Seated core series with yellow wt ball:  hip to hip, hip to shoulder, ear to ear.  X10 each Standing row 10# 2x10 Resisted backwards gait 10# x10 Lat pull 25# 2x10 Wall push up 2x10  04/29/2022: Nustep level 5 x6 min with PT present to discuss status 3 way green pball rollout 5x10 sec each Seated piriformis stretch 2x20 sec bilat Standing hamstring stretch on 2nd step 2x20 sec bilat Standing hip flexor stretch on steps 2x20 sec each Seated butterfly groin  stretch 2x20 sec bilat Prone: HS curl, hip extension, prone press up.  2x10 bilat Lower trunk rotation 5x10 sec bilat Hooklying:  posterior pelvic tilt, PPT with marching.  2x10 each  04/23/2022: Nustep level 1 x6 min with PT  present to discuss status 3 way green pball rollout 5x10 sec each Seated piriformis stretch 2x20 sec bilat Standing hamstring stretch on 2nd step 2x20 each Standing hip flexor stretch on steps 2x20 sec each Seated butterfly groin stretch 2x20 sec bilat Prone quad stretch with strap 2x20 sec bilat Prone hip extension, prone press up.  2x10 bilat Lower trunk rotation 5x10 sec bilat   PATIENT EDUCATION:  Education details: Access Code: [email or web address] Person educated: Patient Education method: Explanation, Demonstration, and Handouts Education comprehension: verbalized understanding   HOME EXERCISE PROGRAM: Access Code: [email or web address] URL: [email or web address] Date: 04/15/2022 Prepared by: Venetia Night Beuhring  Exercises - Sidelying Thoracic Rotation with Open Book  - 1 x daily - 7 x weekly - 1 sets - 5 reps - 10 hold - Supine Piriformis Stretch with Foot on Ground  - 1 x daily - 7 x weekly - 1 sets - 2 reps - 30 hold - Supine Hip Internal and External Rotation  - 1 x daily - 7 x weekly - 1 sets - 10 reps - Supine Hip Internal and External Rotation  - 1 x daily - 7 x weekly - 1 sets - 20 reps - Supine Lower Trunk Rotation  - 1 x daily - 7 x weekly - 13 sets - 3 reps - 10 hold - Butterfly Groin Stretch  - 1 x daily - 7 x weekly - 1 sets - 3 reps - 30 hold   ASSESSMENT:  CLINICAL IMPRESSION: Ms Loja presents to PT with compliance with HEP and eager to participate. Pt reports that she almost went horse riding, but her plans changed, but she does want to try to go horseback riding again soon.  Pt able to progress with exercises to initiate weight machines.  Pt reports feeling better during exercises with no increase to pain.  Pt continues to require skilled PT to progress towards goal related activities.   OBJECTIVE IMPAIRMENTS decreased mobility, decreased ROM, decreased strength, hypomobility, increased muscle spasms, impaired flexibility, postural dysfunction, and pain.   ACTIVITY  LIMITATIONS community activity.   PERSONAL FACTORS Time since onset of injury/illness/exacerbation are also affecting patient's functional outcome.    REHAB POTENTIAL: Excellent  CLINICAL DECISION MAKING: Stable/uncomplicated  EVALUATION COMPLEXITY: Low   GOALS: Goals reviewed with patient? Yes  SHORT TERM GOALS: Target date: 05/16/2022     Pt will be ind with initial HEP Baseline: Goal status: MET on 05/06/2022  2.  Pt will improve LE flexibility of bil adductors to allow hip abd to at least 50 deg. Baseline:  Goal status: IN PROGRESS  3.  Pt will improve Lt hip flexor strength to at least 4+/5. Baseline:  Goal status: INITIAL  4.  Pt will report being able to get on/off horse and ride for at least 10 min with min pain Baseline:  Goal status: INITIAL   LONG TERM GOALS: Target date: 06/10/2022    Pt will be ind with advanced HEP and understand how to safely progress Baseline:  Goal status: INITIAL  2.  Pt will score at least 62% on FOTO to demo improved function. Baseline: 55% Goal status: INITIAL  3.  Pt will normalize LE flexibility to reduce undue strain on back and pelvis and allow more comfortable positioning on horse.  Baseline:  Goal status: INITIAL  4.  Pt will return to riding her horse with min pain. Baseline:  Goal status: INITIAL  5.  Pt will report at least 60% reduction in pain with daily tasks. Baseline:  Goal status: INITIAL    PLAN: PT FREQUENCY: 1x/week  PT DURATION: 8 weeks  PLANNED INTERVENTIONS: Therapeutic exercises, Therapeutic activity, Neuromuscular re-education, Patient/Family education, Joint mobilization, Dry Needling, Electrical stimulation, Spinal mobilization, Cryotherapy, Moist heat, Taping, Ionotophoresis 27m/ml Dexamethasone, and Manual therapy.  PLAN FOR NEXT SESSION: review HEP, progress hip flexor and core strength, add Rt quad stretch and Rt lumbar stretch (seated ball rollouts or child's pose?)   SJuel Burrow  PT 05/06/22 4:08 PM  [facility] [address] [place] [address]Phone # [phone] [phone]

## 2022-05-08 NOTE — Progress Notes (Unsigned)
Jill [name] Phone: [phone] Subjective:   Jill [name], am serving as a scribe for Dr. Hulan [name].   I'm seeing this patient by the request  of:  Jill Frizzle, MD  CC: back and neck pain   [id number]  Jill [name] is a 66 y.o. female coming in with complaint of back and neck pain. OMT on 04/10/2022. Patient states that her pain comes and goes. Does feel like PT is helping though. Uses IBU in bursts once since last visit.   Medications patient has been prescribed: None  Taking:         Reviewed prior external information including notes and imaging from previsou exam, outside providers and external EMR if available.   As well as notes that were available from care everywhere and other healthcare systems.  Past medical history, social, surgical and family history all reviewed in electronic medical record.  [name] pertanent information unless stated regarding to the chief complaint.   Past Medical History:  Diagnosis Date   Arthritis    "hands" (03/24/2017)   Colon polyp    High cholesterol    Multifocal atrial tachycardia (HCC)    PAT (paroxysmal atrial tachycardia) ([place])    [name] 03/24/2017   Peptic ulcer    "when I was a child"   PONV (postoperative nausea and vomiting)    "bad when they did my wisdom teeth"   Pre-diabetes    Seasonal allergies     Allergies  Allergen Reactions   Tessalon [Benzonatate] Anaphylaxis    Per pt, this mixed with Zyrtec made her throat "close up"   Zyrtec [Cetirizine] Anaphylaxis    Per pt, this mixed with Tessalon made her throat "close up"   Codeine Nausea Only    Dizziness (also) and nausea is SEVERE   Contrast Media [Iodinated Contrast Media] Hives    And nausea   Penicillins Rash    Has patient had a PCN reaction causing immediate rash, facial/tongue/throat swelling, SOB or lightheadedness with hypotension: Yes Has patient had a PCN reaction  causing severe rash involving mucus membranes or skin necrosis: [name] Has patient had a PCN reaction that required hospitalization [name] Has patient had a PCN reaction occurring within the last 10 years: [name] If all of the above answers are "[name]", then may proceed with Cephalosporin use.    Valium [Diazepam] Nausea Only and Other (See Comments)    Dizziness (also)     Review of Systems:  [name] headache, visual changes, nausea, vomiting, diarrhea, constipation, dizziness, abdominal pain, skin rash, fevers, chills, night sweats, weight loss, swollen lymph nodes, body aches, joint swelling, chest pain, shortness of breath, mood changes. POSITIVE muscle aches  Objective  Blood pressure 122/80, pulse 90, height '5\' 3"'$  (1.6 m), weight 144 lb (65.3 kg), SpO2 96 %.   General: [name] apparent distress alert and oriented x3 mood and affect normal, dressed appropriately.  HEENT: Pupils equal, extraocular movements intact  Respiratory: Patient's speak in full sentences and does not appear short of breath  Cardiovascular: [name] lower extremity edema, non tender, [name] erythema  Patient back exam does have some loss of lordosis.  Some tenderness to palpation noted also.  Somewhat limited range of motion still noted in the thoracolumbar juncture.  Still some difficulty with rotation completely to the right in the thoracic spine as well  Osteopathic findings   C6 flexed rotated and side bent left T5 extended rotated and side  bent left inhaled rib T11 extended rotated and side bent left L2 flexed rotated and side bent right Sacrum right on right       Assessment and Plan:  Low back pain Low back still tight noted.  Discussed with patient about icing regimen and home exercises.  I do feel the patient has made some progress already and doing physical therapy and home exercises.  Patient will follow-up with me again in 6 to 8 weeks.    Nonallopathic problems  Decision today to treat with OMT was based on Physical  Exam  After verbal consent patient was treated with HVLA, ME, FPR techniques in cervical, rib, thoracic, lumbar, and sacral  areas  Patient tolerated the procedure well with improvement in symptoms  Patient given exercises, stretches and lifestyle modifications  See medications in patient instructions if given  Patient will follow up in 6-8 weeks     The above documentation has been reviewed and is accurate and complete Jill Pulley, DO        Note: This dictation was prepared with Dragon dictation along with smaller phrase technology. Any transcriptional errors that result from this process are unintentional.

## 2022-05-13 ENCOUNTER — Ambulatory Visit: Payer: BC Managed Care – PPO | Admitting: Family Medicine

## 2022-05-13 VITALS — BP 122/80 | HR 90 | Ht 63.0 in | Wt 144.0 lb

## 2022-05-13 DIAGNOSIS — M9908 Segmental and somatic dysfunction of rib cage: Secondary | ICD-10-CM | POA: Diagnosis not present

## 2022-05-13 DIAGNOSIS — G8929 Other chronic pain: Secondary | ICD-10-CM

## 2022-05-13 DIAGNOSIS — M9901 Segmental and somatic dysfunction of cervical region: Secondary | ICD-10-CM

## 2022-05-13 DIAGNOSIS — M9904 Segmental and somatic dysfunction of sacral region: Secondary | ICD-10-CM

## 2022-05-13 DIAGNOSIS — M9903 Segmental and somatic dysfunction of lumbar region: Secondary | ICD-10-CM

## 2022-05-13 DIAGNOSIS — M9902 Segmental and somatic dysfunction of thoracic region: Secondary | ICD-10-CM

## 2022-05-13 DIAGNOSIS — M545 Low back pain, unspecified: Secondary | ICD-10-CM | POA: Diagnosis not present

## 2022-05-13 NOTE — Assessment & Plan Note (Signed)
Low back still tight noted.  Discussed with patient about icing regimen and home exercises.  I do feel the patient has made some progress already and doing physical therapy and home exercises.  Patient will follow-up with me again in 6 to 8 weeks.

## 2022-05-13 NOTE — Patient Instructions (Signed)
Doing well Thanks for working hard 10 more days until you are on the pony Keep doing exercises See me in 6-8 weeks

## 2022-05-14 ENCOUNTER — Ambulatory Visit: Payer: BC Managed Care – PPO | Admitting: Physical Therapy

## 2022-05-14 ENCOUNTER — Encounter: Payer: Self-pay | Admitting: Physical Therapy

## 2022-05-14 DIAGNOSIS — M5459 Other low back pain: Secondary | ICD-10-CM | POA: Diagnosis not present

## 2022-05-14 DIAGNOSIS — M25551 Pain in right hip: Secondary | ICD-10-CM

## 2022-05-14 DIAGNOSIS — M25552 Pain in left hip: Secondary | ICD-10-CM

## 2022-05-14 NOTE — Therapy (Signed)
OUTPATIENT PHYSICAL THERAPY THORACOLUMBAR EVALUATION   Patient Name: Jill [name] MRN: [id number] DOB:[birth year], 66 y.o., female Today's Date: 05/14/2022   PT End of Session - 05/14/22 1148     Visit Number 5    Date for PT Re-Evaluation 06/10/22    Authorization Type BCBS    PT Start Time 1146    PT Stop Time 1232    PT Time Calculation (min) 46 min    Activity Tolerance Patient tolerated treatment well    Behavior During Therapy WFL for tasks assessed/performed              Past Medical History:  Diagnosis Date   Arthritis    "hands" (03/24/2017)   Colon polyp    High cholesterol    Multifocal atrial tachycardia (HCC)    PAT (paroxysmal atrial tachycardia) ([place])    [name] 03/24/2017   Peptic ulcer    "when I was a child"   PONV (postoperative nausea and vomiting)    "bad when they did my wisdom teeth"   Pre-diabetes    Seasonal allergies    Past Surgical History:  Procedure Laterality Date   CERVICAL CERCLAGE  X 2   COLONOSCOPY W/ BIOPSIES AND POLYPECTOMY     TONSILLECTOMY     WISDOM TOOTH EXTRACTION     Patient Active Problem List   Diagnosis Date Noted   Low back pain 04/10/2022   Somatic dysfunction of spine, sacral 04/10/2022   Adrenal nodule ([place]) 03/31/2017   Pancreatic cyst 03/31/2017   Chest pain 03/31/2017   Prediabetes 03/31/2017   Abdominal pain    Pancreatitis 03/27/2017   Hypertensive urgency 03/24/2017   Sinusitis 03/24/2017   Leukocytosis 03/24/2017   Headache 03/24/2017   Hyperglycemia 03/24/2017   Atrial tachycardia, paroxysmal ([place]) 03/24/2017   HLD (hyperlipidemia) 10/29/2016    REFERRING PROVIDER: Lyndal Pulley, DO   REFERRING DIAG: M54.2 (ICD-10-CM) - Cervical spine pain M54.50 (ICD-10-CM) - Lumbar spine pain M25.551,M25.552 (ICD-10-CM) - Bilateral hip pain   THERAPY DIAG:  Other low back pain  Pain in left hip  Pain in right hip   ONSET DATE: chronic since the 1990s, worsened in Dec 2022  SUBJECTIVE:                                                                                                                                                                                            SUBJECTIVE STATEMENT: I am slowly improving.  Dr. Tamala Julian recommended I wait another 2 weeks to get on my horse.   PERTINENT HISTORY:  paroxysmal atrial tachycardia, OA in hands  PAIN:  Are you having pain?  Yes NPRS scale: 0-2/10 today, higher with activity Pain location: central lumbar Pain orientation: Right, Left, Posterior, and Lower  PAIN TYPE: burning and sharp Pain description: intermittent  Aggravating factors: can't ride horse due to pain, squatting (feels weak), walking Relieving factors: sitting, OTC meds, seated straddle stretch and stretches from Dr. Tamala Julian  PATIENT GOALS be able to ride my horse without signif pain, hasn't ridden since March  Rationale for Evaluation and Treatment Rehabilitation    OBJECTIVE:   DIAGNOSTIC FINDINGS:  Cervical spondylosis with bony spurs from C2-C7 levels Moderate to marked degenerative changes are noted in both hips, more severe on the left side Degenerative changes are noted with bony spurs, facet hypertrophy and disc space narrowing        at multiple levels in the lumbar spine and lower thoracic spine.  PATIENT SURVEYS:  04/15/2022:  FOTO 55% goal 62%  SENSATION: WFL, denies pins/needles/numbness in [place], has some in bil hands  MUSCLE LENGTH: Limited adductors bil, limited Rt quad, limited end range bil piriformis and gluteals, limited end range hamstrings  POSTURE:  Reduced thoracic kyphosis and lumbar lordosis  PALPATION: Rt thorcolumbar paraspinals, bil adductors tender and tight, bil piriformis   LUMBAR ROM:  Full ROM with pain noted below Active  A/PROM  04/15/2022  Flexion Fingers to toes  Extension Mild central LBP on ext  Right lateral flexion Rt sided pain  Left lateral flexion Rt sided stretch  Right rotation Full no pain  Left  rotation Full no pain  Bil thoracic seated rotation 50% bil   (Blank rows = not tested)  LE ROM:  Passive  Right 04/15/2022 Left 04/15/2022  Hip flexion  100  Hip extension    Hip abduction 37 40  Hip adduction    Hip internal rotation 6 deg, no pain 3 deg, pain  Hip external rotation  WNL  Knee flexion    Knee extension    Ankle dorsiflexion    Ankle plantarflexion    Ankle inversion    Ankle eversion     (Blank rows = not tested)  LE MMT:  5/5 all LE muscle groups with exception of hip flexors  MMT Right 04/15/2022 Left 04/15/2022  Hip flexion 4+/5 4-/5  Hip extension    Hip abduction    Hip adduction    Hip internal rotation    Hip external rotation    Knee flexion    Knee extension    Ankle dorsiflexion    Ankle plantarflexion    Ankle inversion    Ankle eversion     (Blank rows = not tested)  JOINT MOBILITY: Limited Lt SI joint mobility compared to Rt  FUNCTIONAL TESTS:  Able to squat, balance in SLS   TODAY'S TREATMENT  05/14/22: NuStep L5 x 4' PT present to discuss progress Standing hip flexor stretch bil 1x30" arms overhead SL quad stretch bil 1x30" Seated butterfly stretch 1x30' Supine adductor stretch with strap 1x30 bil LE 3 way green pball rollout 5x10 sec each Supine hip flexion isometric 5x5" holds Rt/Lt Supine slow articulating bridges x 5 Supine SLR with focus on pelvic stabilization, slow, x 5 each LE Dead bugs with 3lb weights in Ues x 20 Seated yellow plyoball: V formation with static march for increased challenge 2x5, seated reverse sit up x 10 Lat pull 25# 2x10 Standing row 10# 2x10  05/06/2022: Nustep level 5 x6 min (452 steps) with PT present to discuss status 3 way green pball rollout 5x10 sec each Seated on  dynadisc 4 way pelvic tilt x10 each Seated hamstring stretch with leg on mat 2x20 sec bilat Seated butterfly groin stretch 2x20 sec bilat Seated core series with yellow wt ball:  hip to hip, hip to shoulder, ear to ear.  X10  each Standing row 10# 2x10 Resisted backwards gait 10# x10 Lat pull 25# 2x10 Wall push up 2x10  04/29/2022: Nustep level 5 x6 min with PT present to discuss status 3 way green pball rollout 5x10 sec each Seated piriformis stretch 2x20 sec bilat Standing hamstring stretch on 2nd step 2x20 sec bilat Standing hip flexor stretch on steps 2x20 sec each Seated butterfly groin stretch 2x20 sec bilat Prone: HS curl, hip extension, prone press up.  2x10 bilat Lower trunk rotation 5x10 sec bilat Hooklying:  posterior pelvic tilt, PPT with marching.  2x10 each  PATIENT EDUCATION:  Education details: Access Code: [email or web address] Person educated: Patient Education method: Explanation, Demonstration, and Handouts Education comprehension: verbalized understanding   HOME EXERCISE PROGRAM: Access Code: [email or web address] URL: [email or web address] Date: 05/14/2022 Prepared by: Venetia Night Von Quintanar  Exercises - Sidelying Thoracic Rotation with Open Book  - 1 x daily - 7 x weekly - 1 sets - 5 reps - 10 hold - Supine Piriformis Stretch with Foot on Ground  - 1 x daily - 7 x weekly - 1 sets - 2 reps - 30 hold - Supine Hip Internal and External Rotation  - 1 x daily - 7 x weekly - 1 sets - 10 reps - Supine Hip Internal and External Rotation  - 1 x daily - 7 x weekly - 1 sets - 20 reps - Supine Lower Trunk Rotation  - 1 x daily - 7 x weekly - 13 sets - 3 reps - 10 hold - Butterfly Groin Stretch  - 1 x daily - 7 x weekly - 1 sets - 3 reps - 30 hold - Hooklying Isometric Hip Flexion  - 1 x daily - 7 x weekly - 2 sets - 5 reps - 5 hold - Straight Leg Raise  - 1 x daily - 7 x weekly - 2 sets - 5 reps - Supine Dead Bug with Leg Extension  - 1 x daily - 7 x weekly - 2 sets - 10 reps - Supine Bridge  - 1 x daily - 7 x weekly - 2 sets - 5 reps  ASSESSMENT:  CLINICAL IMPRESSION: Ms Fujikawa presents to PT with compliance with HEP and eager to participate. She feels she is slowly making progress.  MD suggested she  wait another 2 weeks before trying to get back on her horse.  Session spent today reviewing some stretches from referring MD to ensure proper form and progression core focus with layered UE/LE movement.  PT updated HEP to reflect new therex today.     OBJECTIVE IMPAIRMENTS decreased mobility, decreased ROM, decreased strength, hypomobility, increased muscle spasms, impaired flexibility, postural dysfunction, and pain.   ACTIVITY LIMITATIONS community activity.   PERSONAL FACTORS Time since onset of injury/illness/exacerbation are also affecting patient's functional outcome.    REHAB POTENTIAL: Excellent  CLINICAL DECISION MAKING: Stable/uncomplicated  EVALUATION COMPLEXITY: Low   GOALS: Goals reviewed with patient? Yes  SHORT TERM GOALS: Target date: 05/16/2022     Pt will be ind with initial HEP Baseline: Goal status: MET on 05/06/2022  2.  Pt will improve LE flexibility of bil adductors to allow hip abd to at least 50 deg. Baseline:  Goal status: IN PROGRESS  3.  Pt will improve Lt  hip flexor strength to at least 4+/5. Baseline:  Goal status: INITIAL  4.  Pt will report being able to get on/off horse and ride for at least 10 min with min pain Baseline:  Goal status: INITIAL   LONG TERM GOALS: Target date: 06/10/2022    Pt will be ind with advanced HEP and understand how to safely progress Baseline:  Goal status: INITIAL  2.  Pt will score at least 62% on FOTO to demo improved function. Baseline: 55% Goal status: INITIAL  3.  Pt will normalize LE flexibility to reduce undue strain on back and pelvis and allow more comfortable positioning on horse. Baseline:  Goal status: INITIAL  4.  Pt will return to riding her horse with min pain. Baseline:  Goal status: INITIAL  5.  Pt will report at least 60% reduction in pain with daily tasks. Baseline:  Goal status: INITIAL    PLAN: PT FREQUENCY: 1x/week  PT DURATION: 8 weeks  PLANNED INTERVENTIONS: Therapeutic  exercises, Therapeutic activity, Neuromuscular re-education, Patient/Family education, Joint mobilization, Dry Needling, Electrical stimulation, Spinal mobilization, Cryotherapy, Moist heat, Taping, Ionotophoresis 53m/ml Dexamethasone, and Manual therapy.  PLAN FOR NEXT SESSION: review HEP, progress hip flexor and core strength, add Rt quad stretch and Rt lumbar stretch (seated ball rollouts or child's pose?)  JBaruch Merl PT 05/14/22 1:36 PM   [facility] [address] SBroadviewGOtho [place] [address]Phone # [phone] [phone]

## 2022-05-21 ENCOUNTER — Ambulatory Visit: Payer: BC Managed Care – PPO | Admitting: Rehabilitative and Restorative Service Providers"

## 2022-05-21 ENCOUNTER — Encounter: Payer: Self-pay | Admitting: Rehabilitative and Restorative Service Providers"

## 2022-05-21 DIAGNOSIS — M25552 Pain in left hip: Secondary | ICD-10-CM

## 2022-05-21 DIAGNOSIS — R252 Cramp and spasm: Secondary | ICD-10-CM

## 2022-05-21 DIAGNOSIS — M5459 Other low back pain: Secondary | ICD-10-CM

## 2022-05-21 DIAGNOSIS — M25551 Pain in right hip: Secondary | ICD-10-CM

## 2022-05-21 NOTE — Therapy (Signed)
OUTPATIENT PHYSICAL THERAPY THORACOLUMBAR EVALUATION   Patient Name: Jill [name] MRN: [id number] DOB:Mar 14, 1956, 66 y.o., female Today's Date: 05/21/2022   PT End of Session - 05/21/22 1145     Visit Number 6    Date for PT Re-Evaluation 06/10/22    Authorization Type BCBS    PT Start Time 1145    PT Stop Time 1225    PT Time Calculation (min) 40 min    Activity Tolerance Patient tolerated treatment well    Behavior During Therapy WFL for tasks assessed/performed              Past Medical History:  Diagnosis Date   Arthritis    "hands" (03/24/2017)   Colon polyp    High cholesterol    Multifocal atrial tachycardia (HCC)    PAT (paroxysmal atrial tachycardia) ([place])    [name] 03/24/2017   Peptic ulcer    "when I was a child"   PONV (postoperative nausea and vomiting)    "bad when they did my wisdom teeth"   Pre-diabetes    Seasonal allergies    Past Surgical History:  Procedure Laterality Date   CERVICAL CERCLAGE  X 2   COLONOSCOPY W/ BIOPSIES AND POLYPECTOMY     TONSILLECTOMY     WISDOM TOOTH EXTRACTION     Patient Active Problem List   Diagnosis Date Noted   Low back pain 04/10/2022   Somatic dysfunction of spine, sacral 04/10/2022   Adrenal nodule ([place]) 03/31/2017   Pancreatic cyst 03/31/2017   Chest pain 03/31/2017   Prediabetes 03/31/2017   Abdominal pain    Pancreatitis 03/27/2017   Hypertensive urgency 03/24/2017   Sinusitis 03/24/2017   Leukocytosis 03/24/2017   Headache 03/24/2017   Hyperglycemia 03/24/2017   Atrial tachycardia, paroxysmal ([place]) 03/24/2017   HLD (hyperlipidemia) 10/29/2016    REFERRING PROVIDER: Lyndal Pulley, DO   REFERRING DIAG: M54.2 (ICD-10-CM) - Cervical spine pain M54.50 (ICD-10-CM) - Lumbar spine pain M25.551,M25.552 (ICD-10-CM) - Bilateral hip pain   THERAPY DIAG:  Other low back pain  Pain in left hip  Pain in right hip  Cramp and spasm   ONSET DATE: chronic since the 1990s, worsened in Dec  2022  SUBJECTIVE:                                                                                                                                                                                           SUBJECTIVE STATEMENT: Pt reports that she is feeling better "yesterday for most of the day, I was without any pain at all" without use of medication.  PERTINENT HISTORY:  paroxysmal atrial tachycardia, OA  in hands  PAIN:  Are you having pain? Yes NPRS scale: 0-2/10 today, higher with activity Pain location: central lumbar Pain orientation: Right, Left, Posterior, and Lower  PAIN TYPE: burning and sharp Pain description: intermittent  Aggravating factors: can't ride horse due to pain, squatting (feels weak), walking Relieving factors: sitting, OTC meds, seated straddle stretch and stretches from Dr. Tamala Julian  PATIENT GOALS be able to ride my horse without signif pain, hasn't ridden since March  Rationale for Evaluation and Treatment Rehabilitation    OBJECTIVE:   DIAGNOSTIC FINDINGS:  Cervical spondylosis with bony spurs from C2-C7 levels Moderate to marked degenerative changes are noted in both hips, more severe on the left side Degenerative changes are noted with bony spurs, facet hypertrophy and disc space narrowing        at multiple levels in the lumbar spine and lower thoracic spine.  PATIENT SURVEYS:  04/15/2022:  FOTO 55% goal 62%  SENSATION: WFL, denies pins/needles/numbness in [place], has some in bil hands  MUSCLE LENGTH: Limited adductors bil, limited Rt quad, limited end range bil piriformis and gluteals, limited end range hamstrings  POSTURE:  Reduced thoracic kyphosis and lumbar lordosis  PALPATION: Rt thorcolumbar paraspinals, bil adductors tender and tight, bil piriformis   LUMBAR ROM:  Full ROM with pain noted below Active  A/PROM  04/15/2022  Flexion Fingers to toes  Extension Mild central LBP on ext  Right lateral flexion Rt sided pain  Left lateral  flexion Rt sided stretch  Right rotation Full no pain  Left rotation Full no pain  Bil thoracic seated rotation 50% bil   (Blank rows = not tested)  LE ROM:  Passive  Right 04/15/2022 Left 04/15/2022 Right 05/21/22 Left 05/21/22  Hip flexion  100    Hip extension      Hip abduction 37 40 52 50  Hip adduction      Hip internal rotation 6 deg, no pain 3 deg, pain 20 20  Hip external rotation  WNL    Knee flexion      Knee extension      Ankle dorsiflexion      Ankle plantarflexion      Ankle inversion      Ankle eversion       (Blank rows = not tested)  LE MMT:  5/5 all LE muscle groups with exception of hip flexors  MMT Right 04/15/2022 Left 04/15/2022  Hip flexion 4+/5 4-/5  Hip extension    Hip abduction    Hip adduction    Hip internal rotation    Hip external rotation    Knee flexion    Knee extension    Ankle dorsiflexion    Ankle plantarflexion    Ankle inversion    Ankle eversion     (Blank rows = not tested)  JOINT MOBILITY: Limited Lt SI joint mobility compared to Rt  FUNCTIONAL TESTS:  Able to squat, balance in SLS   TODAY'S TREATMENT   05/21/2022: Nustep level 5 x6 min (496 steps) with PT present to discuss status Supine adductor stretch with strap 2x30 bil LE Hooklying piriformis stretch 2x20 sec bilat Supine slow articulating bridges 2x5 Supine SLR with focus on pelvic stabilization, slow, 2x5 each LE Dead bugs with 2lb weights in UEs x 20 Seated on dynadisc:  4 way pelvic tilt x20 each Seated holding 7# dumbbell: V formation with static march for increased challenge 2x5, seated reverse sit up x 10 Sit to/from stand holding 7# dumbbell x10 reps  Wide stance marching on trampoline:  feet to each diagonal and each side x10 each without UE support Seated butterfly stretch 2x30 sec  05/14/22: NuStep L5 x 4' PT present to discuss progress Standing hip flexor stretch bil 1x30" arms overhead SL quad stretch bil 1x30" Seated butterfly stretch  1x30' Supine adductor stretch with strap 1x30 bil LE 3 way green pball rollout 5x10 sec each Supine hip flexion isometric 5x5" holds Rt/Lt Supine slow articulating bridges x 5 Supine SLR with focus on pelvic stabilization, slow, x 5 each LE Dead bugs with 3lb weights in UEs x 20 Seated yellow plyoball: V formation with static march for increased challenge 2x5, seated reverse sit up x 10 Lat pull 25# 2x10 Standing row 10# 2x10  05/06/2022: Nustep level 5 x6 min (452 steps) with PT present to discuss status 3 way green pball rollout 5x10 sec each Seated on dynadisc 4 way pelvic tilt x10 each Seated hamstring stretch with leg on mat 2x20 sec bilat Seated butterfly groin stretch 2x20 sec bilat Seated core series with yellow wt ball:  hip to hip, hip to shoulder, ear to ear.  X10 each Standing row 10# 2x10 Resisted backwards gait 10# x10 Lat pull 25# 2x10 Wall push up 2x10   PATIENT EDUCATION:  Education details: Access Code: [email or web address] Person educated: Patient Education method: Explanation, Demonstration, and Handouts Education comprehension: verbalized understanding   HOME EXERCISE PROGRAM: Access Code: [email or web address] URL: [email or web address] Date: 05/14/2022 Prepared by: Venetia Night Beuhring  Exercises - Sidelying Thoracic Rotation with Open Book  - 1 x daily - 7 x weekly - 1 sets - 5 reps - 10 hold - Supine Piriformis Stretch with Foot on Ground  - 1 x daily - 7 x weekly - 1 sets - 2 reps - 30 hold - Supine Hip Internal and External Rotation  - 1 x daily - 7 x weekly - 1 sets - 10 reps - Supine Hip Internal and External Rotation  - 1 x daily - 7 x weekly - 1 sets - 20 reps - Supine Lower Trunk Rotation  - 1 x daily - 7 x weekly - 13 sets - 3 reps - 10 hold - Butterfly Groin Stretch  - 1 x daily - 7 x weekly - 1 sets - 3 reps - 30 hold - Hooklying Isometric Hip Flexion  - 1 x daily - 7 x weekly - 2 sets - 5 reps - 5 hold - Straight Leg Raise  - 1 x daily - 7 x weekly - 2  sets - 5 reps - Supine Dead Bug with Leg Extension  - 1 x daily - 7 x weekly - 2 sets - 10 reps - Supine Bridge  - 1 x daily - 7 x weekly - 2 sets - 5 reps  ASSESSMENT:  CLINICAL IMPRESSION: Ms Botz presents to PT with compliance with HEP and eager to participate. Pt reports that she is ready to try riding a horse again next week.  Cautioned pt to take her first couple of rides for a shorter duration than she typically does.  Pt required min cuing throughout for technique but is continuing to progress.  Pt has improved with hip abduction and has met that goal.  Pt continues to have some left hip flexor weakness when compared to right side.     OBJECTIVE IMPAIRMENTS decreased mobility, decreased ROM, decreased strength, hypomobility, increased muscle spasms, impaired flexibility, postural dysfunction, and pain.   ACTIVITY LIMITATIONS community activity.   PERSONAL FACTORS  Time since onset of injury/illness/exacerbation are also affecting patient's functional outcome.    REHAB POTENTIAL: Excellent  CLINICAL DECISION MAKING: Stable/uncomplicated  EVALUATION COMPLEXITY: Low   GOALS: Goals reviewed with patient? Yes  SHORT TERM GOALS: Target date: 05/16/2022     Pt will be ind with initial HEP Baseline: Goal status: MET on 05/06/2022  2.  Pt will improve LE flexibility of bil adductors to allow hip abd to at least 50 deg. Baseline:  Goal status: MET on 05/21/2022  3.  Pt will improve Lt hip flexor strength to at least 4+/5. Baseline:  Goal status: INITIAL  4.  Pt will report being able to get on/off horse and ride for at least 10 min with min pain Baseline:  Goal status: INITIAL   LONG TERM GOALS: Target date: 06/10/2022    Pt will be ind with advanced HEP and understand how to safely progress Baseline:  Goal status: INITIAL  2.  Pt will score at least 62% on FOTO to demo improved function. Baseline: 55% Goal status: INITIAL  3.  Pt will normalize LE flexibility to  reduce undue strain on back and pelvis and allow more comfortable positioning on horse. Baseline:  Goal status: INITIAL  4.  Pt will return to riding her horse with min pain. Baseline:  Goal status: INITIAL  5.  Pt will report at least 60% reduction in pain with daily tasks. Baseline:  Goal status: INITIAL    PLAN: PT FREQUENCY: 1x/week  PT DURATION: 8 weeks  PLANNED INTERVENTIONS: Therapeutic exercises, Therapeutic activity, Neuromuscular re-education, Patient/Family education, Joint mobilization, Dry Needling, Electrical stimulation, Spinal mobilization, Cryotherapy, Moist heat, Taping, Ionotophoresis 3m/ml Dexamethasone, and Manual therapy.  PLAN FOR NEXT SESSION: review HEP, progress hip flexor and core strength, flexibility  SJuel Burrow PT 05/21/22 12:31 PM   [id number] S[address]ulah[place]adsworth [place] [address]Phone # [phone] [phone]

## 2022-05-27 ENCOUNTER — Encounter: Payer: Self-pay | Admitting: Rehabilitative and Restorative Service Providers"

## 2022-05-27 ENCOUNTER — Ambulatory Visit: Payer: BC Managed Care – PPO | Admitting: Rehabilitative and Restorative Service Providers"

## 2022-05-27 DIAGNOSIS — M25551 Pain in right hip: Secondary | ICD-10-CM

## 2022-05-27 DIAGNOSIS — M5459 Other low back pain: Secondary | ICD-10-CM

## 2022-05-27 DIAGNOSIS — R252 Cramp and spasm: Secondary | ICD-10-CM

## 2022-05-27 DIAGNOSIS — M25552 Pain in left hip: Secondary | ICD-10-CM

## 2022-05-27 NOTE — Therapy (Signed)
OUTPATIENT PHYSICAL THERAPY THORACOLUMBAR EVALUATION   Patient Name: Jill [name] MRN: [id number] DOB:January 25, 1956, 66 y.o., female Today's Date: 05/27/2022   PT End of Session - 05/27/22 1450     Visit Number 7    Date for PT Re-Evaluation 06/10/22    Authorization Type BCBS    PT Start Time 1448    PT Stop Time 1526    PT Time Calculation (min) 38 min    Activity Tolerance Patient tolerated treatment well    Behavior During Therapy WFL for tasks assessed/performed              Past Medical History:  Diagnosis Date   Arthritis    "hands" (03/24/2017)   Colon polyp    High cholesterol    Multifocal atrial tachycardia (HCC)    PAT (paroxysmal atrial tachycardia) (HCC)    Hattie Perch 03/24/2017   Peptic ulcer    "when I was a child"   PONV (postoperative nausea and vomiting)    "bad when they did my wisdom teeth"   Pre-diabetes    Seasonal allergies    Past Surgical History:  Procedure Laterality Date   CERVICAL CERCLAGE  X 2   COLONOSCOPY W/ BIOPSIES AND POLYPECTOMY     TONSILLECTOMY     WISDOM TOOTH EXTRACTION     Patient Active Problem List   Diagnosis Date Noted   Low back pain 04/10/2022   Somatic dysfunction of spine, sacral 04/10/2022   Adrenal nodule (HCC) 03/31/2017   Pancreatic cyst 03/31/2017   Chest pain 03/31/2017   Prediabetes 03/31/2017   Abdominal pain    Pancreatitis 03/27/2017   Hypertensive urgency 03/24/2017   Sinusitis 03/24/2017   Leukocytosis 03/24/2017   Headache 03/24/2017   Hyperglycemia 03/24/2017   Atrial tachycardia, paroxysmal (HCC) 03/24/2017   HLD (hyperlipidemia) 10/29/2016    REFERRING PROVIDER: Judi Saa, DO   REFERRING DIAG: M54.2 (ICD-10-CM) - Cervical spine pain M54.50 (ICD-10-CM) - Lumbar spine pain M25.551,M25.552 (ICD-10-CM) - Bilateral hip pain   THERAPY DIAG:  Other low back pain  Pain in left hip  Pain in right hip  Cramp and spasm   ONSET DATE: chronic since the 1990s, worsened in Dec  2022  SUBJECTIVE:                                                                                                                                                                                           SUBJECTIVE STATEMENT: Pt reports that she is overall approx 55% better since starting PT  PERTINENT HISTORY:  paroxysmal atrial tachycardia, OA in hands  PAIN:  Are you having pain? Yes NPRS scale:  2-3/10 today, higher with activity Pain location: central lumbar Pain orientation: Right, Left, Posterior, and Lower  PAIN TYPE: burning and sharp Pain description: intermittent  Aggravating factors: can't ride horse due to pain, squatting (feels weak), walking Relieving factors: sitting, OTC meds, seated straddle stretch and stretches from Dr. Katrinka Blazing  PATIENT GOALS be able to ride my horse without signif pain, hasn't ridden since March  Rationale for Evaluation and Treatment Rehabilitation    OBJECTIVE:   DIAGNOSTIC FINDINGS:  Cervical spondylosis with bony spurs from C2-C7 levels Moderate to marked degenerative changes are noted in both hips, more severe on the left side Degenerative changes are noted with bony spurs, facet hypertrophy and disc space narrowing        at multiple levels in the lumbar spine and lower thoracic spine.  PATIENT SURVEYS:  04/15/2022:  FOTO 55% goal 62%  SENSATION: WFL, denies pins/needles/numbness in [place], has some in bil hands  MUSCLE LENGTH: Limited adductors bil, limited Rt quad, limited end range bil piriformis and gluteals, limited end range hamstrings  POSTURE:  Reduced thoracic kyphosis and lumbar lordosis  PALPATION: Rt thorcolumbar paraspinals, bil adductors tender and tight, bil piriformis   LUMBAR ROM:  Full ROM with pain noted below Active  A/PROM  04/15/2022  Flexion Fingers to toes  Extension Mild central LBP on ext  Right lateral flexion Rt sided pain  Left lateral flexion Rt sided stretch  Right rotation Full no pain  Left  rotation Full no pain  Bil thoracic seated rotation 50% bil   (Blank rows = not tested)  LE ROM:  Passive  Right 04/15/2022 Left 04/15/2022 Right 05/21/22 Left 05/21/22  Hip flexion  100    Hip extension      Hip abduction 37 40 52 50  Hip adduction      Hip internal rotation 6 deg, no pain 3 deg, pain 20 20  Hip external rotation  WNL    Knee flexion      Knee extension      Ankle dorsiflexion      Ankle plantarflexion      Ankle inversion      Ankle eversion       (Blank rows = not tested)  LE MMT:  5/5 all LE muscle groups with exception of hip flexors  MMT Right 04/15/2022 Left 04/15/2022  Hip flexion 4+/5 4-/5  Hip extension    Hip abduction    Hip adduction    Hip internal rotation    Hip external rotation    Knee flexion    Knee extension    Ankle dorsiflexion    Ankle plantarflexion    Ankle inversion    Ankle eversion     (Blank rows = not tested)  JOINT MOBILITY: Limited Lt SI joint mobility compared to Rt  FUNCTIONAL TESTS:  Able to squat, balance in SLS   TODAY'S TREATMENT   05/27/2022: Nustep level 5 x6 min (519 steps) with PT present to discuss status Supine adductor stretch with strap 2x30 bil LE Hooklying piriformis stretch 2x20 sec bilat Supine slow articulating bridges 2x7 Supine SLR and SLR with ER with focus on pelvic stabilization, slow, 2x5 bilat each Dead bugs with 2lb weights in UEs x 20 Seated on green pball:  4 way pelvic tilt x15 each and V formation with static march holding 7# dumbbell 2x10 Seated reverse crunch holding 7# 2x10 Sit to/from stand holding 7# dumbbell x10 reps Standing row 10# 2x10 Lat Pull 30# 2x10 Hip hiking  standing on black foam pad x10 bilat "Hula hoop" hip circles CW and CCW in standing x10 bilat  05/21/2022: Nustep level 5 x6 min (496 steps) with PT present to discuss status Supine adductor stretch with strap 2x30 bil LE Hooklying piriformis stretch 2x20 sec bilat Supine slow articulating bridges  2x5 Supine SLR with focus on pelvic stabilization, slow, 2x5 each LE Dead bugs with 2lb weights in UEs x 20 Seated on dynadisc:  4 way pelvic tilt x20 each Seated holding 7# dumbbell: V formation with static march for increased challenge 2x5, seated reverse sit up x 10 Sit to/from stand holding 7# dumbbell x10 reps Wide stance marching on trampoline:  feet to each diagonal and each side x10 each without UE support Seated butterfly stretch 2x30 sec  05/14/22: NuStep L5 x 4' PT present to discuss progress Standing hip flexor stretch bil 1x30" arms overhead SL quad stretch bil 1x30" Seated butterfly stretch 1x30' Supine adductor stretch with strap 1x30 bil LE 3 way green pball rollout 5x10 sec each Supine hip flexion isometric 5x5" holds Rt/Lt Supine slow articulating bridges x 5 Supine SLR with focus on pelvic stabilization, slow, x 5 each LE Dead bugs with 3lb weights in UEs x 20 Seated yellow plyoball: V formation with static march for increased challenge 2x5, seated reverse sit up x 10 Lat pull 25# 2x10 Standing rows 10# 2x10   PATIENT EDUCATION:  Education details: Access Code: [email or web address] Person educated: Patient Education method: Explanation, Demonstration, and Handouts Education comprehension: verbalized understanding   HOME EXERCISE PROGRAM: Access Code: [email or web address] URL: [email or web address] Date: 05/14/2022 Prepared by: Loistine Simas Beuhring  Exercises - Sidelying Thoracic Rotation with Open Book  - 1 x daily - 7 x weekly - 1 sets - 5 reps - 10 hold - Supine Piriformis Stretch with Foot on Ground  - 1 x daily - 7 x weekly - 1 sets - 2 reps - 30 hold - Supine Hip Internal and External Rotation  - 1 x daily - 7 x weekly - 1 sets - 10 reps - Supine Hip Internal and External Rotation  - 1 x daily - 7 x weekly - 1 sets - 20 reps - Supine Lower Trunk Rotation  - 1 x daily - 7 x weekly - 13 sets - 3 reps - 10 hold - Butterfly Groin Stretch  - 1 x daily - 7 x weekly - 1  sets - 3 reps - 30 hold - Hooklying Isometric Hip Flexion  - 1 x daily - 7 x weekly - 2 sets - 5 reps - 5 hold - Straight Leg Raise  - 1 x daily - 7 x weekly - 2 sets - 5 reps - Supine Dead Bug with Leg Extension  - 1 x daily - 7 x weekly - 2 sets - 10 reps - Supine Bridge  - 1 x daily - 7 x weekly - 2 sets - 5 reps  ASSESSMENT:  CLINICAL IMPRESSION: Ms Bloxham presents to PT with compliance with HEP and eager to participate. Pt reports that she will attempt riding a horse later this week when her husband is available to assist.  Pt able to progress with therex during session and states that she feels a hip pinching at times, but it dissipates quickly.  Pt continues with overall improvements towards goals and at end of session states "I feel better now."  Pt continues to require skilled PT to progress towards goal related activities.   OBJECTIVE IMPAIRMENTS decreased mobility, decreased ROM,  decreased strength, hypomobility, increased muscle spasms, impaired flexibility, postural dysfunction, and pain.   ACTIVITY LIMITATIONS community activity.   PERSONAL FACTORS Time since onset of injury/illness/exacerbation are also affecting patient's functional outcome.    REHAB POTENTIAL: Excellent  CLINICAL DECISION MAKING: Stable/uncomplicated  EVALUATION COMPLEXITY: Low   GOALS: Goals reviewed with patient? Yes  SHORT TERM GOALS: Target date: 05/16/2022     Pt will be ind with initial HEP Baseline: Goal status: MET on 05/06/2022  2.  Pt will improve LE flexibility of bil adductors to allow hip abd to at least 50 deg. Baseline:  Goal status: MET on 05/21/2022  3.  Pt will improve Lt hip flexor strength to at least 4+/5. Baseline:  Goal status: INITIAL  4.  Pt will report being able to get on/off horse and ride for at least 10 min with min pain Baseline:  Goal status: INITIAL   LONG TERM GOALS: Target date: 06/10/2022    Pt will be ind with advanced HEP and understand how to safely  progress Baseline:  Goal status: IN PROGRESS  2.  Pt will score at least 62% on FOTO to demo improved function. Baseline: 55% Goal status: INITIAL  3.  Pt will normalize LE flexibility to reduce undue strain on back and pelvis and allow more comfortable positioning on horse. Baseline:  Goal status: INITIAL  4.  Pt will return to riding her horse with min pain. Baseline:  Goal status: INITIAL  5.  Pt will report at least 60% reduction in pain with daily tasks. Baseline: reports 55% improvement on 05/27/22 Goal status: IN PROGRESS    PLAN: PT FREQUENCY: 1x/week  PT DURATION: 8 weeks  PLANNED INTERVENTIONS: Therapeutic exercises, Therapeutic activity, Neuromuscular re-education, Patient/Family education, Joint mobilization, Dry Needling, Electrical stimulation, Spinal mobilization, Cryotherapy, Moist heat, Taping, Ionotophoresis 4mg /ml Dexamethasone, and Manual therapy.  PLAN FOR NEXT SESSION: review HEP, progress hip flexor and core strength, flexibility  Reather Laurence, PT 05/27/22 3:45 PM   [facility] [address] [place], [place] [address] Phone # [phone] Fax [phone]

## 2022-06-02 ENCOUNTER — Encounter: Payer: Self-pay | Admitting: Rehabilitative and Restorative Service Providers"

## 2022-06-02 ENCOUNTER — Ambulatory Visit: Payer: BC Managed Care – PPO | Attending: Family Medicine | Admitting: Rehabilitative and Restorative Service Providers"

## 2022-06-02 DIAGNOSIS — M5459 Other low back pain: Secondary | ICD-10-CM | POA: Insufficient documentation

## 2022-06-02 DIAGNOSIS — M25552 Pain in left hip: Secondary | ICD-10-CM | POA: Insufficient documentation

## 2022-06-02 DIAGNOSIS — R252 Cramp and spasm: Secondary | ICD-10-CM | POA: Insufficient documentation

## 2022-06-02 DIAGNOSIS — M25551 Pain in right hip: Secondary | ICD-10-CM | POA: Diagnosis present

## 2022-06-02 NOTE — Therapy (Addendum)
OUTPATIENT PHYSICAL THERAPY TREATMENT NOTE AND LATE ENTRY DISCHARGE   Patient Name: Jill [name] MRN: [id number] DOB:1956/10/25, 66 y.o., female Today's Date: 06/02/2022   PT End of Session - 06/02/22 1149     Visit Number 8    Date for PT Re-Evaluation 06/10/22    Authorization Type BCBS    PT Start Time 1145    PT Stop Time 1225    PT Time Calculation (min) 40 min    Activity Tolerance Patient tolerated treatment well    Behavior During Therapy WFL for tasks assessed/performed              Past Medical History:  Diagnosis Date   Arthritis    "hands" (03/24/2017)   Colon polyp    High cholesterol    Multifocal atrial tachycardia (HCC)    PAT (paroxysmal atrial tachycardia) ([place])    [name] 03/24/2017   Peptic ulcer    "when I was a child"   PONV (postoperative nausea and vomiting)    "bad when they did my wisdom teeth"   Pre-diabetes    Seasonal allergies    Past Surgical History:  Procedure Laterality Date   CERVICAL CERCLAGE  X 2   COLONOSCOPY W/ BIOPSIES AND POLYPECTOMY     TONSILLECTOMY     WISDOM TOOTH EXTRACTION     Patient Active Problem List   Diagnosis Date Noted   Low back pain 04/10/2022   Somatic dysfunction of spine, sacral 04/10/2022   Adrenal nodule ([place]) 03/31/2017   Pancreatic cyst 03/31/2017   Chest pain 03/31/2017   Prediabetes 03/31/2017   Abdominal pain    Pancreatitis 03/27/2017   Hypertensive urgency 03/24/2017   Sinusitis 03/24/2017   Leukocytosis 03/24/2017   Headache 03/24/2017   Hyperglycemia 03/24/2017   Atrial tachycardia, paroxysmal ([place]) 03/24/2017   HLD (hyperlipidemia) 10/29/2016    REFERRING PROVIDER: Lyndal Pulley, DO   REFERRING DIAG: M54.2 (ICD-10-CM) - Cervical spine pain M54.50 (ICD-10-CM) - Lumbar spine pain M25.551,M25.552 (ICD-10-CM) - Bilateral hip pain   THERAPY DIAG:  Other low back pain  Pain in left hip  Pain in right hip  Cramp and spasm   ONSET DATE: chronic since the 1990s, worsened in  Dec 2022  SUBJECTIVE:                                                                                                                                                                                           SUBJECTIVE STATEMENT: Pt reports that she was able to saddle her horse and stand in the stirrups, but her husband was not present, so she did not feel comfortable riding  yet.  Pt reports pain is fluctuating between a 2-6, but currently 2/10 pain.  PERTINENT HISTORY:  paroxysmal atrial tachycardia, OA in hands  PAIN:  Are you having pain? Yes NPRS scale: 2/10 today, higher with activity Pain location: central lumbar Pain orientation: Right, Left, Posterior, and Lower  PAIN TYPE: burning and sharp Pain description: intermittent  Aggravating factors: can't ride horse due to pain, squatting (feels weak), walking Relieving factors: sitting, OTC meds, seated straddle stretch and stretches from Dr. Tamala Julian  PATIENT GOALS be able to ride my horse without signif pain, hasn't ridden since March  Rationale for Evaluation and Treatment Rehabilitation    OBJECTIVE:   DIAGNOSTIC FINDINGS:  Cervical spondylosis with bony spurs from C2-C7 levels Moderate to marked degenerative changes are noted in both hips, more severe on the left side Degenerative changes are noted with bony spurs, facet hypertrophy and disc space narrowing        at multiple levels in the lumbar spine and lower thoracic spine.  PATIENT SURVEYS:  04/15/2022:  FOTO 55% goal 62%  SENSATION: WFL, denies pins/needles/numbness in [place], has some in bil hands  MUSCLE LENGTH: Limited adductors bil, limited Rt quad, limited end range bil piriformis and gluteals, limited end range hamstrings  POSTURE:  Reduced thoracic kyphosis and lumbar lordosis  PALPATION: Rt thorcolumbar paraspinals, bil adductors tender and tight, bil piriformis   LUMBAR ROM:  Full ROM with pain noted below Active  A/PROM  04/15/2022  Flexion  Fingers to toes  Extension Mild central LBP on ext  Right lateral flexion Rt sided pain  Left lateral flexion Rt sided stretch  Right rotation Full no pain  Left rotation Full no pain  Bil thoracic seated rotation 50% bil   (Blank rows = not tested)  LE ROM:  Passive  Right 04/15/2022 Left 04/15/2022 Right 05/21/22 Left 05/21/22  Hip flexion  100    Hip extension      Hip abduction 37 40 52 50  Hip adduction      Hip internal rotation 6 deg, no pain 3 deg, pain 20 20  Hip external rotation  WNL    Knee flexion      Knee extension      Ankle dorsiflexion      Ankle plantarflexion      Ankle inversion      Ankle eversion       (Blank rows = not tested)  LE MMT:  5/5 all LE muscle groups with exception of hip flexors  MMT Right 04/15/2022 Left 04/15/2022  Hip flexion 4+/5 4-/5  Hip extension    Hip abduction    Hip adduction    Hip internal rotation    Hip external rotation    Knee flexion    Knee extension    Ankle dorsiflexion    Ankle plantarflexion    Ankle inversion    Ankle eversion     (Blank rows = not tested)  JOINT MOBILITY: Limited Lt SI joint mobility compared to Rt  FUNCTIONAL TESTS:  Able to squat, balance in SLS   TODAY'S TREATMENT   06/02/2022: Nustep level 5 x6 min (521 steps) with PT present to discuss status Supine adductor stretch with strap 2x30 bil LE Hooklying piriformis stretch 2x20 sec bilat Dead bugs with 2lb weights in UEs x 20 Supine slow articulating bridges 2x7 Supine SLR and SLR with ER with focus on pelvic stabilization, slow, 2x5 bilat each Seated butterfly stretch 2x20 sec Sit to/from stand holding 7# dumbbell  x10 reps Seated reverse crunch holding 7# 2x10 "Hula hoop" hip circles CW and CCW in standing x15 bilat Hip hiking standing on black foam pad x10 bilat Lat Pull 30# 2x10 Rows 25# 2x10 Leg Press 90# 2x10  05/27/2022: Nustep level 5 x6 min (519 steps) with PT present to discuss status Supine adductor stretch with  strap 2x30 bil LE Hooklying piriformis stretch 2x20 sec bilat Supine slow articulating bridges 2x7 Supine SLR and SLR with ER with focus on pelvic stabilization, slow, 2x5 bilat each Dead bugs with 2lb weights in UEs x 20 Seated on green pball:  4 way pelvic tilt x15 each and V formation with static march holding 7# dumbbell 2x10 Seated reverse crunch holding 7# 2x10 Sit to/from stand holding 7# dumbbell x10 reps Standing row 10# 2x10 Lat Pull 30# 2x10 Hip hiking standing on black foam pad x10 bilat "Hula hoop" hip circles CW and CCW in standing x10 bilat  05/21/2022: Nustep level 5 x6 min (496 steps) with PT present to discuss status Supine adductor stretch with strap 2x30 bil LE Hooklying piriformis stretch 2x20 sec bilat Supine slow articulating bridges 2x5 Supine SLR with focus on pelvic stabilization, slow, 2x5 each LE Dead bugs with 2lb weights in UEs x 20 Seated on dynadisc:  4 way pelvic tilt x20 each Seated holding 7# dumbbell: V formation with static march for increased challenge 2x5, seated reverse sit up x 10 Sit to/from stand holding 7# dumbbell x10 reps Wide stance marching on trampoline:  feet to each diagonal and each side x10 each without UE support Seated butterfly stretch 2x30 sec   PATIENT EDUCATION:  Education details: Access Code: [email or web address] Person educated: Patient Education method: Explanation, Demonstration, and Handouts Education comprehension: verbalized understanding   HOME EXERCISE PROGRAM: Access Code: [email or web address] URL: [email or web address] Date: 05/14/2022 Prepared by: Venetia Night Beuhring  Exercises - Sidelying Thoracic Rotation with Open Book  - 1 x daily - 7 x weekly - 1 sets - 5 reps - 10 hold - Supine Piriformis Stretch with Foot on Ground  - 1 x daily - 7 x weekly - 1 sets - 2 reps - 30 hold - Supine Hip Internal and External Rotation  - 1 x daily - 7 x weekly - 1 sets - 10 reps - Supine Hip Internal and External Rotation  - 1 x  daily - 7 x weekly - 1 sets - 20 reps - Supine Lower Trunk Rotation  - 1 x daily - 7 x weekly - 13 sets - 3 reps - 10 hold - Butterfly Groin Stretch  - 1 x daily - 7 x weekly - 1 sets - 3 reps - 30 hold - Hooklying Isometric Hip Flexion  - 1 x daily - 7 x weekly - 2 sets - 5 reps - 5 hold - Straight Leg Raise  - 1 x daily - 7 x weekly - 2 sets - 5 reps - Supine Dead Bug with Leg Extension  - 1 x daily - 7 x weekly - 2 sets - 10 reps - Supine Bridge  - 1 x daily - 7 x weekly - 2 sets - 5 reps  ASSESSMENT:  CLINICAL IMPRESSION: Ms Tahir presents to PT with compliance with HEP and eager to participate. Pt has not yet had the chance to go for a horse ride yet, but was able to stand up in the stirrups and did not report any increased pain.  Pt is hoping to go for a horseback ride prior to next  appointment.  Pt able to tolerate increased strengthening exercises without increased pain.  Pt continues to require skilled PT to progress towards goal related activities.   OBJECTIVE IMPAIRMENTS decreased mobility, decreased ROM, decreased strength, hypomobility, increased muscle spasms, impaired flexibility, postural dysfunction, and pain.   ACTIVITY LIMITATIONS community activity.   PERSONAL FACTORS Time since onset of injury/illness/exacerbation are also affecting patient's functional outcome.    REHAB POTENTIAL: Excellent  CLINICAL DECISION MAKING: Stable/uncomplicated  EVALUATION COMPLEXITY: Low   GOALS: Goals reviewed with patient? Yes  SHORT TERM GOALS: Target date: 05/16/2022     Pt will be ind with initial HEP Baseline: Goal status: MET on 05/06/2022  2.  Pt will improve LE flexibility of bil adductors to allow hip abd to at least 50 deg. Baseline:  Goal status: MET on 05/21/2022  3.  Pt will improve Lt hip flexor strength to at least 4+/5. Baseline:  Goal status: INITIAL  4.  Pt will report being able to get on/off horse and ride for at least 10 min with min pain Baseline:   Goal status: INITIAL   LONG TERM GOALS: Target date: 06/10/2022    Pt will be ind with advanced HEP and understand how to safely progress Baseline:  Goal status: IN PROGRESS  2.  Pt will score at least 62% on FOTO to demo improved function. Baseline: 55% Goal status: INITIAL  3.  Pt will normalize LE flexibility to reduce undue strain on back and pelvis and allow more comfortable positioning on horse. Baseline:  Goal status: INITIAL  4.  Pt will return to riding her horse with min pain. Baseline:  Goal status: INITIAL  5.  Pt will report at least 60% reduction in pain with daily tasks. Baseline: reports 55% improvement on 05/27/22 Goal status: IN PROGRESS    PLAN: PT FREQUENCY: 1x/week  PT DURATION: 8 weeks  PLANNED INTERVENTIONS: Therapeutic exercises, Therapeutic activity, Neuromuscular re-education, Patient/Family education, Joint mobilization, Dry Needling, Electrical stimulation, Spinal mobilization, Cryotherapy, Moist heat, Taping, Ionotophoresis 32m/ml Dexamethasone, and Manual therapy.  PLAN FOR NEXT SESSION: review HEP, progress hip flexor and core strength, flexibility   PHYSICAL THERAPY DISCHARGE SUMMARY  As of 06/30/2022, pt has not returned for follow up PT visits and discharged at this time. Patient agrees to discharge. Patient goals were partially met. Patient is being discharged due to not returning since the last visit.       SJuel Burrow PT 06/02/22 12:38 PM   [facility] [address] [place] [address]Phone # [phone] [phone]

## 2022-06-09 ENCOUNTER — Ambulatory Visit: Payer: BC Managed Care – PPO | Admitting: Rehabilitative and Restorative Service Providers"

## 2022-06-24 NOTE — Progress Notes (Deleted)
[facility] [place] Phone: [phone] Subjective:    I'm seeing this patient by the request  of:  Susy Frizzle, MD  CC:   [id number]  Jill [name] is a 66 y.o. female coming in with complaint of back and neck pain. OMT on 05/13/2022. Patient states   Medications patient has been prescribed: None  Taking:         Reviewed prior external information including notes and imaging from previsou exam, outside providers and external EMR if available.   As well as notes that were available from care everywhere and other healthcare systems.  Past medical history, social, surgical and family history all reviewed in electronic medical record.  No pertanent information unless stated regarding to the chief complaint.   Past Medical History:  Diagnosis Date   Arthritis    "hands" (03/24/2017)   Colon polyp    High cholesterol    Multifocal atrial tachycardia (HCC)    PAT (paroxysmal atrial tachycardia) ([place])    [name] 03/24/2017   Peptic ulcer    "when I was a child"   PONV (postoperative nausea and vomiting)    "bad when they did my wisdom teeth"   Pre-diabetes    Seasonal allergies     Allergies  Allergen Reactions   Tessalon [Benzonatate] Anaphylaxis    Per pt, this mixed with Zyrtec made her throat "close up"   Zyrtec [Cetirizine] Anaphylaxis    Per pt, this mixed with Tessalon made her throat "close up"   Codeine Nausea Only    Dizziness (also) and nausea is SEVERE   Contrast Media [Iodinated Contrast Media] Hives    And nausea   Penicillins Rash    Has patient had a PCN reaction causing immediate rash, facial/tongue/throat swelling, SOB or lightheadedness with hypotension: Yes Has patient had a PCN reaction causing severe rash involving mucus membranes or skin necrosis: No Has patient had a PCN reaction that required hospitalization No Has patient had a PCN reaction occurring within the last 10  years: No If all of the above answers are "NO", then may proceed with Cephalosporin use.    Valium [Diazepam] Nausea Only and Other (See Comments)    Dizziness (also)     Review of Systems:  No headache, visual changes, nausea, vomiting, diarrhea, constipation, dizziness, abdominal pain, skin rash, fevers, chills, night sweats, weight loss, swollen lymph nodes, body aches, joint swelling, chest pain, shortness of breath, mood changes. POSITIVE muscle aches  Objective  There were no vitals taken for this visit.   General: No apparent distress alert and oriented x3 mood and affect normal, dressed appropriately.  HEENT: Pupils equal, extraocular movements intact  Respiratory: Patient's speak in full sentences and does not appear short of breath  Cardiovascular: No lower extremity edema, non tender, no erythema  Gait MSK:  Back   Osteopathic findings  C2 flexed rotated and side bent right C6 flexed rotated and side bent left T3 extended rotated and side bent right inhaled rib T9 extended rotated and side bent left L2 flexed rotated and side bent right Sacrum right on right       Assessment and Plan:  No problem-specific Assessment & Plan notes found for this encounter.    Nonallopathic problems  Decision today to treat with OMT was based on Physical Exam  After verbal consent patient was treated with HVLA, ME, FPR techniques in cervical, rib, thoracic, lumbar, and sacral  areas  Patient tolerated the procedure well with improvement in symptoms  Patient given exercises, stretches and lifestyle modifications  See medications in patient instructions if given  Patient will follow up in 4-8 weeks             Note: This dictation was prepared with Dragon dictation along with smaller phrase technology. Any transcriptional errors that result from this process are unintentional.

## 2022-06-25 ENCOUNTER — Ambulatory Visit: Payer: BC Managed Care – PPO | Admitting: Family Medicine

## 2022-07-11 NOTE — Progress Notes (Unsigned)
Jill [name] [address] [place] Phone: [phone] Subjective:   [name], am serving as a scribe for Dr. Hulan [name].  I'm seeing this patient by the request  of:  Jill Frizzle, MD  CC: Low back pain follow-up  [id number]  Jill [name] is a 66 y.o. female coming in with complaint of back and neck pain. OMT on 05/13/2022.  Patient has been doing physical therapy.  Patient states not doing the best. Wants recommendations on next steps, because improvement has been very little. Physical therapy didn't help much. No new issues.  Medications patient has been prescribed: None  Taking: X-rays in May of the pelvis, lumbar and cervical were taken in May.  Patient does have degenerative disc disease in the lumbar and cervical spine.  Patient does have moderate arthritic changes left greater than right of the hips as well.        Reviewed prior external information including notes and imaging from previsou exam, outside providers and external EMR if available.   As well as notes that were available from care everywhere and other healthcare systems.  Past medical history, social, surgical and family history all reviewed in electronic medical record.  No pertanent information unless stated regarding to the chief complaint.   Past Medical History:  Diagnosis Date   Arthritis    "hands" (03/24/2017)   Colon polyp    High cholesterol    Multifocal atrial tachycardia (HCC)    PAT (paroxysmal atrial tachycardia) ([place])    Jill [name] 03/24/2017   Peptic ulcer    "when I was a child"   PONV (postoperative nausea and vomiting)    "bad when they did my wisdom teeth"   Pre-diabetes    Seasonal allergies     Allergies  Allergen Reactions   Tessalon [Benzonatate] Anaphylaxis    Per pt, this mixed with Zyrtec made her throat "close up"   Zyrtec [Cetirizine] Anaphylaxis    Per pt, this mixed with Tessalon made her throat "close up"    Codeine Nausea Only    Dizziness (also) and nausea is SEVERE   Contrast Media [Iodinated Contrast Media] Hives    And nausea   Penicillins Rash    Has patient had a PCN reaction causing immediate rash, facial/tongue/throat swelling, SOB or lightheadedness with hypotension: Yes Has patient had a PCN reaction causing severe rash involving mucus membranes or skin necrosis: No Has patient had a PCN reaction that required hospitalization No Has patient had a PCN reaction occurring within the last 10 years: No If all of the above answers are "NO", then may proceed with Cephalosporin use.    Valium [Diazepam] Nausea Only and Other (See Comments)    Dizziness (also)     Review of Systems:  No headache, visual changes, nausea, vomiting, diarrhea, constipation, dizziness, abdominal pain, skin rash, fevers, chills, night sweats, weight loss, swollen lymph nodes, body aches, joint swelling, chest pain, shortness of breath, mood changes. POSITIVE muscle aches, body aches  Objective  Blood pressure 124/80, pulse 86, height '5\' 3"'$  (1.6 m), SpO2 96 %.   General: No apparent distress alert and oriented x3 mood and affect normal, dressed appropriately.  HEENT: Pupils equal, extraocular movements intact  Respiratory: Patient's speak in full sentences and does not appear short of breath  Cardiovascular: No lower extremity edema, non tender, no erythema  MSK:  Back does have loss of lordosis. The patient does have limited range of motion  of the hips bilaterally.  It is especially with less than 5 degrees of internal rotation bilaterally.  Severe lumbar tenderness to palpation in the paraspinal musculature of the lumbar spine noted.  Patient has only 5 degrees of extension.      Assessment and Plan:      The above documentation has been reviewed and is accurate and complete Jill Pulley, DO          Note: This dictation was prepared with Dragon dictation along with smaller phrase  technology. Any transcriptional errors that result from this process are unintentional.

## 2022-07-14 ENCOUNTER — Ambulatory Visit: Payer: BC Managed Care – PPO | Admitting: Family Medicine

## 2022-07-14 VITALS — BP 124/80 | HR 86 | Ht 63.0 in

## 2022-07-14 DIAGNOSIS — G8929 Other chronic pain: Secondary | ICD-10-CM

## 2022-07-14 DIAGNOSIS — M545 Low back pain, unspecified: Secondary | ICD-10-CM

## 2022-07-14 NOTE — Patient Instructions (Signed)
[place] [phone] Call Today  When we receive your results we will contact you.  We will see if epidural would be beneficial Other idea is hip replacement, but we will see

## 2022-07-14 NOTE — Assessment & Plan Note (Signed)
Patient continues to have tightness, seems to be worsening.  Failed all conservative therapy including physical therapy.  Because patient is having a limited range of motion and does have arthritic changes of the hips concerned that there is weakness secondary to nerve impingement of the back.  Before patient would consider any surgical intervention I do feel that we need to further evaluate with advanced imaging of the back.  We will see how much is this is responsible for her pain.  Depending on this she could be a candidate for possible injections with an epidural.  Follow-up with me after imaging to discuss further.

## 2022-07-24 ENCOUNTER — Ambulatory Visit
Admission: RE | Admit: 2022-07-24 | Discharge: 2022-07-24 | Disposition: A | Discharge status: Home / Self Care | Payer: BC Managed Care – PPO | Source: Ambulatory Visit | Attending: Family Medicine | Admitting: Family Medicine

## 2022-07-24 DIAGNOSIS — G8929 Other chronic pain: Secondary | ICD-10-CM

## 2022-10-06 ENCOUNTER — Ambulatory Visit: Payer: BC Managed Care – PPO | Admitting: Family Medicine

## 2022-10-06 VITALS — BP 142/86 | HR 78 | Ht 63.0 in

## 2022-10-06 DIAGNOSIS — M25551 Pain in right hip: Secondary | ICD-10-CM

## 2022-10-06 DIAGNOSIS — G8929 Other chronic pain: Secondary | ICD-10-CM

## 2022-10-06 DIAGNOSIS — M25552 Pain in left hip: Secondary | ICD-10-CM

## 2022-10-06 DIAGNOSIS — M7711 Lateral epicondylitis, right elbow: Secondary | ICD-10-CM | POA: Diagnosis not present

## 2022-10-06 DIAGNOSIS — G5603 Carpal tunnel syndrome, bilateral upper limbs: Secondary | ICD-10-CM

## 2022-10-06 DIAGNOSIS — M65351 Trigger finger, right little finger: Secondary | ICD-10-CM | POA: Diagnosis not present

## 2022-10-06 NOTE — Patient Instructions (Addendum)
Thank you for coming in today.   I've referred you to Occupation Therapy.  Let us know if you don't hear from them in one week.   Try carpal tunnel wrist braces.   Let me know about gabapentin.   Please use Voltaren gel (Generic Diclofenac Gel) up to 4x daily for pain as needed.  This is available over-the-counter as both the name brand Voltaren gel and the generic diclofenac gel.   Use the double band splint.   Let me know about MRI arthorgram.

## 2022-10-06 NOTE — Progress Notes (Signed)
I, Peterson Lombard, LAT, ATC acting as a scribe for Lynne Leader, MD.  Jill [name] is a 66 y.o. female who presents to [place] at [facility] today for bilat hand pain. Pt was previously seen by Dr. Tamala Julian on 07/14/22 for LBP. Today, pt presents w/ bilat pain and numbness in her hand. Pt notes she is getting triggering intermittently on R 5th finger. Pt locates pain to bilat elbows to fingers 1-3. 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   Hand swelling: no Grip strength: decreased Paresthesia: yes Aggravates: use Treatments tried: naproxen, Tylenol  Additionally she has chronic back and hip pain that has been managed by Dr. Tamala Julian.  She had a trial of physical therapy that was unsuccessful.  She notes pain in her buttocks and the lateral and anterior hip left worse than right.  This is so bad that she cannot even get on her horse to ride her horse.  She is willing to consider surgery for this if needed.  Dx imaging: 04/10/22 C-spine XR  Pertinent review of systems: No fevers or chills  Relevant historical information: Adrenal nodule   Exam:  BP (!) 142/86   Pulse 78   Ht '5\' 3"'$  (1.6 m)   SpO2 98%   BMI 25.51 kg/m  General: Well Developed, well nourished, and in no acute distress.   MSK:  C-spine: Normal appearing Nontender cervical midline. Normal cervical motion. Minimally positive Spurling's test bilaterally. Upper extremity strength reflexes and sensation are intact bilaterally.  Right elbow: Normal appearing Normal motion. Tender palpation lateral epicondyles. Some pain with resisted wrist extension is present at the lateral epicondyle.  Right wrist are normal appearing. Nontender to palpation. Positive Tinel's and Phalen's test. Intact grip strength. Right fifth digit triggering  present with flexion of the PIP joint palpated in the palmar MCP.  Left wrist: Normal appearing Nontender palpation. Positive Tinel's and Phalen's test. Intact grip strength.  Hips: Decreased range of motion to abduction external and internal rotation bilaterally.    Lab and Radiology Results EXAM: PELVIS - 1-2 VIEW   COMPARISON:  None Available.   FINDINGS: No recent fracture is seen. Degenerative changes are noted in both hips with bony spurs, more so in the left hip. There is narrowing of joint space in the left hip. Bony spurs are seen in the pubic symphysis. SI joints are unremarkable.   IMPRESSION: No recent fracture is seen. Moderate to marked degenerative changes are noted in both hips, more severe on the left side.     Electronically Signed   By: Elmer Picker M.D.   On: 04/12/2022 15:43 I, Lynne Leader, personally (independently) visualized and performed the interpretation of the images attached in this note.       Assessment and Plan: 66 y.o. female with   Bilateral upper extremity paresthesias into the hands.  This is most consistent with carpal tunnel syndrome.  There may be a component of cervical radiculopathy but this is much less dominant.  Plan for night splints and occupational therapy referral.  We discussed gabapentin trial and she declined the medication for now.  Additionally she has some right lateral elbow pain thought to be lateral epicondylitis.  Again hand therapy referral should be helpful.  Additionally she has right trigger finger right fifth digit.  Plan for double Band-Aid splint and hand therapy.  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  She is already had a trial of physical therapy which unfortunately was not successful.  Although the location of her pain is not consistent with hip arthritis she certainly does have a fair amount of hip arthritis on x-ray from May of this  year and an MRI arthrogram would clarify this.  She will let me know if she would like me to order this test.  Recheck in about 6 weeks following trial of hand therapy.  Consider injection if needed.   PDMP not reviewed this encounter. Orders Placed This Encounter  Procedures   Ambulatory referral to Occupational Therapy    Referral Priority:   Routine    Referral Type:   Occupational Therapy    Referral Reason:   Specialty Services Required    Requested Specialty:   Occupational Therapy    Number of Visits Requested:   1   No orders of the defined types were placed in this encounter.    Discussed warning signs or symptoms. Please see discharge instructions. Patient expresses understanding.   The above documentation has been reviewed and is accurate and complete Lynne Leader, M.D.

## 2022-10-10 ENCOUNTER — Encounter: Payer: Self-pay | Admitting: Rehabilitative and Restorative Service Providers"

## 2022-10-10 ENCOUNTER — Ambulatory Visit: Payer: BC Managed Care – PPO | Admitting: Rehabilitative and Restorative Service Providers"

## 2022-10-10 ENCOUNTER — Other Ambulatory Visit: Payer: Self-pay

## 2022-10-10 DIAGNOSIS — M25541 Pain in joints of right hand: Secondary | ICD-10-CM | POA: Diagnosis not present

## 2022-10-10 DIAGNOSIS — M25521 Pain in right elbow: Secondary | ICD-10-CM

## 2022-10-10 DIAGNOSIS — M6281 Muscle weakness (generalized): Secondary | ICD-10-CM

## 2022-10-10 DIAGNOSIS — M25631 Stiffness of right wrist, not elsewhere classified: Secondary | ICD-10-CM | POA: Diagnosis not present

## 2022-10-10 DIAGNOSIS — R202 Paresthesia of skin: Secondary | ICD-10-CM

## 2022-10-10 NOTE — Therapy (Signed)
OUTPATIENT OCCUPATIONAL THERAPY ORTHO EVALUATION  Patient Name: Jill [name] MRN: [id number] DOB:October 23, 1956, 66 y.o., female Today's Date: 10/10/2022  PCP: Jenna Luo, MD REFERRING PROVIDER:  Gregor Hams, MD     OT End of Session - 10/10/22 1104     Visit Number 1    Number of Visits 10    Date for OT Re-Evaluation 12/05/22    Authorization Type BCBS STATE    OT Start Time 1105    OT Stop Time 1225    OT Time Calculation (min) 80 min    Activity Tolerance No increased pain;Patient tolerated treatment well;Patient limited by pain;Patient limited by fatigue    Behavior During Therapy [facility] for tasks assessed/performed             Past Medical History:  Diagnosis Date   Arthritis    "hands" (03/24/2017)   Colon polyp    High cholesterol    Multifocal atrial tachycardia    PAT (paroxysmal atrial tachycardia)    [name] 03/24/2017   Peptic ulcer    "when I was a child"   PONV (postoperative nausea and vomiting)    "bad when they did my wisdom teeth"   Pre-diabetes    Seasonal allergies    Past Surgical History:  Procedure Laterality Date   CERVICAL CERCLAGE  X 2   COLONOSCOPY W/ BIOPSIES AND POLYPECTOMY     TONSILLECTOMY     WISDOM TOOTH EXTRACTION     Patient Active Problem List   Diagnosis Date Noted   Low back pain 04/10/2022   Somatic dysfunction of spine, sacral 04/10/2022   Adrenal nodule ([place]) 03/31/2017   Pancreatic cyst 03/31/2017   Chest pain 03/31/2017   Prediabetes 03/31/2017   Abdominal pain    Pancreatitis 03/27/2017   Hypertensive urgency 03/24/2017   Sinusitis 03/24/2017   Leukocytosis 03/24/2017   Headache 03/24/2017   Hyperglycemia 03/24/2017   Atrial tachycardia, paroxysmal 03/24/2017   HLD (hyperlipidemia) 10/29/2016    ONSET DATE: acute on chronic exacerbations over past 3 months   REFERRING DIAG:  G56.03 (ICD-10-CM) - Bilateral carpal tunnel syndrome  M77.11 (ICD-10-CM) - Lateral epicondylitis of right elbow    THERAPY  DIAG:  Pain in right elbow  Pain in joint of right hand  Stiffness of right wrist, not elsewhere classified  Paresthesia of skin  Muscle weakness (generalized)  Rationale for Evaluation and Treatment: Rehabilitation  SUBJECTIVE:   SUBJECTIVE STATEMENT: She is a retired Conservation officer, 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 She states for several years she's had intermittent numbness in hands which affects riding her horses. She also states having neck issues and various injuries (in the 1980's fell off a horse). She has had successful chiropractic treatments on neck that helped relieve numbness in hands. More recently, her hips/low back pain are mostly limiting her riding ability.   Her husband had open heart sx and she's been doing more work now, more hand numbness- much worse in night. Also R SF is triggering. She states her hand issues are #1 importance for her now.    PERTINENT HISTORY: Per MD order and notes: "Evaluate and treat. 2-3 times per week for 4-6 weeks. 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 She owns horses...chronic back and hip pain .Marland KitchenMarland KitchenBilateral...carpal tunnel syndrome...may be a component of cervical radiculopathy...night splints...some right...lateral epicondylitis...right trigger finger right fifth digit. Plan for double Band-Aid splint."  PRECAUTIONS: None  WEIGHT BEARING RESTRICTIONS: No  PAIN:  Are you having pain? None at rest, but numbness 1/10 at rest in hands (L>R)  Rating: non-nerve pain up to 0-1/10 in past week at worst and nerve pain up to 7-8/10 at worst at night and with heavier work tasks.    FALLS: Has patient fallen in last 6 months? No  LIVING ENVIRONMENT: Lives with: lives with their spouse   PLOF: Independent  PATIENT GOALS: Not to have hands go to sleep at night.    OBJECTIVE: (All objective assessments below are from initial  evaluation on: 10/10/22 unless otherwise specified.)    HAND DOMINANCE: Right   ADLs: Overall ADLs: States decreased ability to grab, hold household objects, pain and inability to open containers, heavier work tasks with horses, etc.    FUNCTIONAL OUTCOME MEASURES: Eval: Quck DASH 22.7% impairment today  (Higher % Score  =  More Impairment)     UPPER EXTREMITY ROM     Shoulder to Wrist AROM Right eval Left eval  Forearm supination 58 88  Forearm pronation  80 80  Wrist flexion 51 71  Wrist extension 57 74  (Blank rows = not tested)   Hand AROM Right eval Left eval  Full Fist Ability (or Gap to Distal Palmar Crease) yes yes  Thumb Opposition to Small Finger (or Gap) yes yes  Little MCP (0-90) TBD     Little PIP (0-100)      Little DIP (0-70)      (Blank rows = not tested)   UPPER EXTREMITY MMT:     MMT Right 10/10/22  Elbow flexion   Elbow extension   Forearm supination   Forearm pronation   Wrist flexion 5/5  Wrist extension 4+/5  Wrist ulnar deviation   Wrist radial deviation   (Blank rows = not tested)  HAND FUNCTION: Eval: Observed weakness in affected hand.  Grip strength Right: 55 lbs tender, Left: 47 lbs   COORDINATION: Eval: No observed coordination impairments with b/l hands    SENSATION: Eval: Light touch intact today, but decreased at times in at least median and ulnar nerve distributions, detailed testing TBD  EDEMA:   Eval: none today   OBSERVATIONS:   Eval: mildly positive elbow flexion test b/l L> R;  positive Tinel at CT b/l, tender in Rt ligament of Struthers area for median nerve as well.   Very tender at hook of hamate b/l as well (from shoveling).  Rt elbow non-tender now.  Neg. Resisted wrist ext, Rt SF not currently triggering bu sore at A1 pulley.   She presents as a healing right lateral epicondylitis, right small finger trigger finger at the A1 pulley, ulnar neuritis at Guyon's canal bilaterally, and median neuritis at both  carpal tunnel and ligament of Struthers.   TODAY'S TREATMENT:  Post-evaluation treatment: She was given comprehensive education on prevention of nerve compression and irritation by avoiding repetition, wearing padded gloves, avoiding poor sleep postures, and watching habits.  She was also educated about trigger finger what to avoid and how to use Band-Aids to prevent full arc of motion and trigger.  She was educated to do self massage and ice as needed.  She was educated for lateral epicondylitis to use 5 minutes heat and self massage followed by the following stretches listed below.  She was also educated on nerve gliding techniques to reduce nerve compression and increase sensation as below.  She tolerated all of this well and stated understanding.  She  will need review of all of these most likely.  Exercises - Ulnar Nerve Flossing  - 3-4 x daily - 1-2 sets - 5-10 reps - Median Nerve Flossing  - 4-6 x daily - 1 sets - 10-15 reps - Seated Median Nerve Glide  - 3-4 x daily - 5 reps - Forearm Supination Stretch  - 3-4 x daily - 3-5 reps - 15 sec hold - Wrist Flexion Stretch  - 4 x daily - 3-5 reps - 15 sec hold - Wrist Extension Stretch Pronated  - 4 x daily - 3-5 reps - 15 hold   PATIENT EDUCATION: Education details: See tx section above for details  Person educated: Patient Education method: Verbal Instruction, Teach back, Handouts  Education comprehension: States and demonstrates understanding, Additional Education required    HOME EXERCISE PROGRAM: Access Code: [email or web address] URL: [email or web address] Date: 10/10/2022 Prepared by: Benito Mccreedy   GOALS: Goals reviewed with patient? Yes   SHORT TERM GOALS: (STG required if POC>30 days) Target Date: 10/31/22  Pt will obtain protective, custom orthotic(s). Goal status: TBD/PRN,    2.  Pt will demo/state understanding of initial HEP to improve pain levels and prerequisite motion. Goal status: INITIAL   LONG TERM  GOALS: Target Date: 12/05/2022  Pt will improve functional ability by decreased impairment per Quick DASH assessment from 22% to 10% or better, for better quality of life. Goal status: INITIAL  2.  Pt will improve grip strength in b/l hands by at least 5 lbs and not have tenderness to show improved functional use at home and in IADLs. Goal status: INITIAL  3.  Pt will improve A/ROM in Rt wrist flex/ext from 51/57, respectively to at least 70/70, to have functional motion for tasks like reach and grasp.  Goal status: INITIAL  4.  Pt will improve strength in Rt resisted wrist ext from 4+/5 tender MMT to at least 5/5 MMT to have increased functional ability to carry out selfcare and higher-level homecare tasks with no difficulty. Goal status: INITIAL  5.  Pt will decrease nerve pain at worst from 7-8/10 to 2-3/10 or better to have better sleep and occupational participation in daily roles. Goal status: INITIAL   ASSESSMENT:  CLINICAL IMPRESSION: Patient is a 66 y.o. female who was seen today for occupational therapy evaluation for b/l hand numbness, pain, triggering finger and elbow pain all causing decreased quality of life and functional problems. She presents as a healing right lateral epicondylitis, right small finger trigger finger at the A1 pulley, ulnar neuritis at Guyon's canal bilaterally, and median neuritis at both carpal tunnel and ligament of Struthers. She will benefit from course of OT to improve fnl ability.    PERFORMANCE DEFICITS: in functional skills including ADLs, IADLs, coordination, dexterity, ROM, strength, pain, fascial restrictions, muscle spasms, flexibility, Fine motor control, Gross motor control, body mechanics, endurance, decreased knowledge of precautions, and UE functional use, cognitive skills including problem solving and safety awareness, and psychosocial skills including coping strategies, environmental adaptation, habits, and routines and behaviors.    IMPAIRMENTS: are limiting patient from ADLs, IADLs, rest and sleep, work, leisure, and social participation.   COMORBIDITIES: may have co-morbidities  that affects occupational performance. Patient will benefit from skilled OT to address above impairments and improve overall function.  MODIFICATION OR ASSISTANCE TO COMPLETE EVALUATION: Min-Moderate modification of tasks or assist with assess necessary to complete an evaluation.  OT OCCUPATIONAL PROFILE AND HISTORY: Detailed assessment: Review of records and additional review of physical, cognitive, psychosocial history  related to current functional performance.  CLINICAL DECISION MAKING: High - multiple treatment options, significant modification of task necessary  REHAB POTENTIAL: Good  EVALUATION COMPLEXITY: Moderate      PLAN:  OT FREQUENCY:  2-3 x week as needed initially  OT DURATION: 8 weeks (through 12/05/22 as needed)  PLANNED INTERVENTIONS: self care/ADL training, therapeutic exercise, therapeutic activity, neuromuscular re-education, manual therapy, passive range of motion, splinting, ultrasound, fluidotherapy, compression bandaging, moist heat, cryotherapy, contrast bath, patient/family education, energy conservation, and coping strategies training  RECOMMENDED OTHER SERVICES: none now  CONSULTED AND AGREED WITH PLAN OF CARE: Patient  PLAN FOR NEXT SESSION: Check on initial HEP and use of "band-aid" splints and night braces. Give more rigid, custom orthoses as needed to help prevent symptoms and exacerbations (elbow, hand, etc.). Review HEP for nerve glides and stretches    Dorsel Flinn, OTR/L, CHT 10/10/2022, 12:52 PM

## 2022-10-15 ENCOUNTER — Encounter: Payer: Self-pay | Admitting: Family Medicine

## 2022-10-15 DIAGNOSIS — G8929 Other chronic pain: Secondary | ICD-10-CM

## 2022-10-21 ENCOUNTER — Encounter: Payer: Self-pay | Admitting: Rehabilitative and Restorative Service Providers"

## 2022-10-21 ENCOUNTER — Ambulatory Visit: Payer: BC Managed Care – PPO | Admitting: Rehabilitative and Restorative Service Providers"

## 2022-10-21 DIAGNOSIS — M25521 Pain in right elbow: Secondary | ICD-10-CM

## 2022-10-21 DIAGNOSIS — M25631 Stiffness of right wrist, not elsewhere classified: Secondary | ICD-10-CM

## 2022-10-21 DIAGNOSIS — R202 Paresthesia of skin: Secondary | ICD-10-CM

## 2022-10-21 DIAGNOSIS — M6281 Muscle weakness (generalized): Secondary | ICD-10-CM

## 2022-10-21 DIAGNOSIS — M25541 Pain in joints of right hand: Secondary | ICD-10-CM

## 2022-10-21 NOTE — Therapy (Signed)
OUTPATIENT OCCUPATIONAL THERAPY TREATMENT & DISCHARGE NOTE  Patient Name: Jill [name] MRN: [id number] DOB:Jul 13, 1956, 66 y.o., female Today's Date: 10/21/2022  PCP: Jenna Luo, MD REFERRING PROVIDER:  Gregor Hams, MD    OT End of Session - 10/21/22 1340     Visit Number 2    Number of Visits 10    Date for OT Re-Evaluation 12/05/22    Authorization Type BCBS STATE    OT Start Time 1343    OT Stop Time 1425    OT Time Calculation (min) 42 min    Activity Tolerance No increased pain;Patient tolerated treatment well;Patient limited by pain;Patient limited by fatigue    Behavior During Therapy [facility] for tasks assessed/performed              Past Medical History:  Diagnosis Date   Arthritis    "hands" (03/24/2017)   Colon polyp    High cholesterol    Multifocal atrial tachycardia    PAT (paroxysmal atrial tachycardia)    [name] 03/24/2017   Peptic ulcer    "when I was a child"   PONV (postoperative nausea and vomiting)    "bad when they did my wisdom teeth"   Pre-diabetes    Seasonal allergies    Past Surgical History:  Procedure Laterality Date   CERVICAL CERCLAGE  X 2   COLONOSCOPY W/ BIOPSIES AND POLYPECTOMY     TONSILLECTOMY     WISDOM TOOTH EXTRACTION     Patient Active Problem List   Diagnosis Date Noted   Low back pain 04/10/2022   Somatic dysfunction of spine, sacral 04/10/2022   Adrenal nodule ([place]) 03/31/2017   Pancreatic cyst 03/31/2017   Chest pain 03/31/2017   Prediabetes 03/31/2017   Abdominal pain    Pancreatitis 03/27/2017   Hypertensive urgency 03/24/2017   Sinusitis 03/24/2017   Leukocytosis 03/24/2017   Headache 03/24/2017   Hyperglycemia 03/24/2017   Atrial tachycardia, paroxysmal 03/24/2017   HLD (hyperlipidemia) 10/29/2016    ONSET DATE: acute on chronic exacerbations over past 3 months   REFERRING DIAG:  G56.03 (ICD-10-CM) - Bilateral carpal tunnel syndrome  M77.11 (ICD-10-CM) - Lateral epicondylitis of right elbow     THERAPY DIAG:  Pain in right elbow  Paresthesia of skin  Stiffness of right wrist, not elsewhere classified  Pain in joint of right hand  Muscle weakness (generalized)  Rationale for Evaluation and Treatment: Rehabilitation  PERTINENT HISTORY: Per MD order and notes: "Evaluate and treat. 2-3 times per week for 4-6 weeks. 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 She owns horses...chronic back and hip pain .Marland KitchenMarland KitchenBilateral...carpal tunnel syndrome...may be a component of cervical radiculopathy...night splints...some right...lateral epicondylitis...right trigger finger right fifth digit. 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 She states for several years she's had intermittent numbness in hands which affects riding her horses. She also states having neck issues and various injuries (in the 1980's fell off a horse). She has had successful chiropractic treatments on neck that helped relieve numbness in hands. More recently, her hips/low back pain are mostly limiting her riding ability.   Her husband had open heart sx and she's been doing more work now, more hand numbness- much worse in night. Also R SF is triggering. She states her hand issues are #1 importance for her now. "  PRECAUTIONS: None  WEIGHT BEARING RESTRICTIONS: No   SUBJECTIVE:   SUBJECTIVE STATEMENT: She states wanting this to be her last therapy appointment, but she also states doing much better at this point.  She does not say why she wants to finish therapy now   PAIN:  Are you having pain?  None at rest, or numbness today    PATIENT GOALS: Not to have hands go to sleep at night.    OBJECTIVE: (All objective assessments below are from initial evaluation on: 10/10/22 unless otherwise specified.)    HAND DOMINANCE: Right    ADLs: Overall ADLs: States decreased ability to grab, hold household objects, pain and inability to open containers, heavier work tasks with horses, etc.    FUNCTIONAL OUTCOME MEASURES: Eval: Quck DASH 22.7% impairment today  (Higher % Score  =  More Impairment)     UPPER EXTREMITY ROM     Shoulder to Wrist AROM Right eval Left eval Right   /  Left 10/21/22  Forearm supination 58 88 78 Right   Forearm pronation  80 80   Wrist flexion 51 71 75* Right   Wrist extension 57 74 69*  (Blank rows = not tested)   Hand AROM Right 10/21/22  Full Fist Ability (or Gap to Distal Palmar Crease) yes  Thumb Opposition to Small Finger (or Gap) yes  Little MCP (0-90)   Little PIP (0-100)    Little DIP (0-70)    (Blank rows = not tested)   UPPER EXTREMITY MMT:     MMT Right 10/10/22  Elbow flexion   Elbow extension   Forearm supination   Forearm pronation   Wrist flexion 5/5  Wrist extension 4+/5  Wrist ulnar deviation   Wrist radial deviation   (Blank rows = not tested)  HAND FUNCTION: 10/21/22:  Rt: 53#,   Lt: 58.6#  Eval: Observed weakness in affected hand.  Grip strength Right: 55 lbs tender, Left: 47 lbs   COORDINATION: Eval: No observed coordination impairments with b/l hands    SENSATION: 10/21/22: She states little to no numbness in the night now.  States improved sensation during the day for most of the time.    Eval: Light touch intact today, but decreased at times in at least median and ulnar nerve distributions, detailed testing TBD  OBSERVATIONS:   Eval: mildly positive elbow flexion test b/l L> R;  positive Tinel at CT b/l, tender in Rt ligament of Struthers area for median nerve as well.   Very tender at hook of hamate b/l as well (from shoveling).  Rt elbow non-tender now.  Neg. Resisted wrist ext, Rt SF not currently triggering bu sore at A1 pulley.   She presents as a healing right lateral epicondylitis, right small finger trigger finger at the A1  pulley, ulnar neuritis at Guyon's canal bilaterally, and median neuritis at both carpal tunnel and ligament of Struthers.   TODAY'S TREATMENT:  10/21/22: Pt performs AROM, gripping, and strength with b/l hands against resistance for exercise/activities as well as new measures today. Using that data, OT also reviews home exercises and provides final recommendations for home exercises and activities, including continuing to monitor for nerve impingements and continue to perform exercises.  She states understanding. OT also discusses home and functional tasks with the pt and reviews goals, and she seems to have little complaints at this point.. Pt states understanding need to continue with recommendations or else possibly regress.   PATIENT EDUCATION: Education details: See tx section above  for details  Person educated: Patient Education method: Verbal Instruction, Teach back, Handouts  Education comprehension: States and demonstrates understanding, Additional Education required    HOME EXERCISE PROGRAM: Access Code: [email or web address] URL: [email or web address] Date: 10/10/2022 Prepared by: Benito Mccreedy   GOALS: Goals reviewed with patient? Yes   SHORT TERM GOALS: (STG required if POC>30 days) Target Date: 10/31/22  Pt will obtain protective, custom orthotic(s). Goal status: Discharge goal today  2.  Pt will demo/state understanding of initial HEP to improve pain levels and prerequisite motion. Goal status: Goal met   LONG TERM GOALS: Target Date: 12/05/2022  Pt will improve functional ability by decreased impairment per Quick DASH assessment from 22% to 10% or better, for better quality of life. Goal status: Discharge goal today  2.  Pt will improve grip strength in b/l hands by at least 5 lbs and not have tenderness to show improved functional use at home and in IADLs. Goal status: 10/21/22: Met in Lt hand- rt hand still not met due to caution with trigger SF  3.  Pt  will improve A/ROM in Rt wrist flex/ext from 51/57, respectively to at least 70/70, to have functional motion for tasks like reach and grasp.  Goal status: 10/21/22: MET  4.  Pt will improve strength in Rt resisted wrist ext from 4+/5 tender MMT to at least 5/5 MMT to have increased functional ability to carry out selfcare and higher-level homecare tasks with no difficulty. Goal status: 10/21/22: MET  5.  Pt will decrease nerve pain at worst from 7-8/10 to 2-3/10 or better to have better sleep and occupational participation in daily roles. Goal status: 10/21/22: now 3/10 MET   ASSESSMENT:  CLINICAL IMPRESSION: 10/21/22: Due to her wanting to unexpectedly finish therapy, occupational therapist does check of range of motion strength sensation etc.  Ultimately, she performs better on all of these things now than just last week, and meets most of her goals.  She was educated how to keep going down the "right" path and she states understanding.  She is discharged today per her request   Eval: Patient is a 66 y.o. female who was seen today for occupational therapy evaluation for b/l hand numbness, pain, triggering finger and elbow pain all causing decreased quality of life and functional problems. She presents as a healing right lateral epicondylitis, right small finger trigger finger at the A1 pulley, ulnar neuritis at Guyon's canal bilaterally, and median neuritis at both carpal tunnel and ligament of Struthers. She will benefit from course of OT to improve fnl ability.     PLAN:  OT FREQUENCY: Discharge  OT DURATION: Discharge  PLANNED INTERVENTIONS: self care/ADL training, therapeutic exercise, therapeutic activity, neuromuscular re-education, manual therapy, passive range of motion, splinting, ultrasound, fluidotherapy, compression bandaging, moist heat, cryotherapy, contrast bath, patient/family education, energy conservation, and coping strategies training  CONSULTED AND AGREED WITH PLAN  OF CARE: Patient  PLAN FOR NEXT SESSION:  Discharge today per patient's request    Benito Mccreedy, OTR/L, CHT 10/21/2022, 5:25 PM    OCCUPATIONAL THERAPY DISCHARGE SUMMARY  Visits from Start of Care: 2  Current functional level related to goals / functional outcomes: Pt has met all goals to satisfactory levels and is pleased with outcomes.   Remaining deficits: Pt has no more significant functional deficits or pain.   Education / Equipment: Pt has all needed materials and education. Pt understands how to continue on with self-management. See tx notes for more details.   Patient agrees to discharge  due to max benefits received from outpatient occupational therapy / hand therapy at this time.   Benito Mccreedy, OTR/L, CHT 10/21/22

## 2022-10-22 ENCOUNTER — Encounter: Payer: BC Managed Care – PPO | Admitting: Rehabilitative and Restorative Service Providers"

## 2022-10-28 ENCOUNTER — Encounter: Payer: Self-pay | Admitting: *Deleted

## 2022-11-03 ENCOUNTER — Ambulatory Visit: Payer: BC Managed Care – PPO | Admitting: Sports Medicine

## 2022-11-09 ENCOUNTER — Encounter: Payer: Self-pay | Admitting: Sports Medicine

## 2022-11-10 ENCOUNTER — Ambulatory Visit (INDEPENDENT_AMBULATORY_CARE_PROVIDER_SITE_OTHER): Payer: BC Managed Care – PPO

## 2022-11-10 ENCOUNTER — Ambulatory Visit (INDEPENDENT_AMBULATORY_CARE_PROVIDER_SITE_OTHER): Payer: BC Managed Care – PPO | Admitting: Sports Medicine

## 2022-11-10 DIAGNOSIS — M25552 Pain in left hip: Secondary | ICD-10-CM

## 2022-11-10 DIAGNOSIS — M1612 Unilateral primary osteoarthritis, left hip: Secondary | ICD-10-CM | POA: Diagnosis not present

## 2022-11-10 DIAGNOSIS — G8929 Other chronic pain: Secondary | ICD-10-CM

## 2022-11-10 MED ORDER — GADOBUTROL 1 MMOL/ML IV SOLN
1.0000 mL | Freq: Once | INTRAVENOUS | Status: AC | PRN
  Administered 2022-11-10: 1 mL

## 2022-11-10 NOTE — Progress Notes (Signed)
    Procedures performed today:    Procedure: Real-time Ultrasound Guided gadolinium contrast injection of left hip joint Device: Samsung [id number]  Verbal informed consent obtained.  Time-out conducted.  Noted no overlying erythema, induration, or other signs of local infection.  Skin prepped in a sterile fashion.  Local anesthesia: Topical Ethyl chloride.  With sterile technique and under real time ultrasound guidance: Noted arthritic hip joint, 3 cc lidocaine, 3 cc bupivacaine injected easily, syringe switched and 0.1 cc gadolinium injected, syringe again switched and 10 cc sterile saline used to fully distend the joint. Joint visualized and capsule seen distending confirming intra-articular placement of contrast material and medication. Completed without difficulty  Advised to call if fevers/chills, erythema, induration, drainage, or persistent bleeding.  Images permanently stored in PACS Impression: Technically successful ultrasound guided gadolinium contrast injection for MR arthrography.  Please see separate MR arthrogram report.  Independent interpretation of notes and tests performed by another provider:   None.  Brief History, Exam, Impression, and Recommendations:    Primary osteoarthritis of left hip Arthritic, loss of internal rotation on the left, today we did an injection for MR arthrography, further management per primary treating provider. Patient did request no steroid in the injection today.    ____________________________________________ Gwen Her. Dianah Field, M.D., ABFM., CAQSM., AME. Primary Care and Sports Medicine [facility]  Adjunct Professor of [employer]  Risk manager

## 2022-11-10 NOTE — Assessment & Plan Note (Signed)
Arthritic, loss of internal rotation on the left, today we did an injection for MR arthrography, further management per primary treating provider. Patient did request no steroid in the injection today.

## 2022-11-12 ENCOUNTER — Ambulatory Visit: Payer: BC Managed Care – PPO | Admitting: Family Medicine

## 2022-11-12 VITALS — BP 134/78 | HR 87 | Ht 63.0 in

## 2022-11-12 DIAGNOSIS — M1612 Unilateral primary osteoarthritis, left hip: Secondary | ICD-10-CM

## 2022-11-12 DIAGNOSIS — M25552 Pain in left hip: Secondary | ICD-10-CM

## 2022-11-12 DIAGNOSIS — Z780 Asymptomatic menopausal state: Secondary | ICD-10-CM | POA: Diagnosis not present

## 2022-11-12 DIAGNOSIS — G8929 Other chronic pain: Secondary | ICD-10-CM

## 2022-11-12 DIAGNOSIS — M8440XA Pathological fracture, unspecified site, initial encounter for fracture: Secondary | ICD-10-CM

## 2022-11-12 NOTE — Progress Notes (Signed)
Left hip MRI shows advanced hip arthritis and bone bruising.  It is possible you even have an insufficiency fracture which is like a stress fracture. I think this is the source of your pain and you will likely need a hip replacement soon.  Recommend recheck in clinic to go over the results soon.  I tried to call you this morning but I was unable to leave a message.

## 2022-11-12 NOTE — Patient Instructions (Addendum)
Thank you for coming in today.   You should hear from ortho shortly.   Let me know if you have any problems.

## 2022-11-12 NOTE — Progress Notes (Signed)
I, Peterson Lombard, LAT, ATC acting as a scribe for Lynne Leader, MD.  Jill [name] is a 66 y.o. female who presents to [facility] today for f/u chronic L hip pain and MRI arthrogram review. Pt was last seen by Dr. Georgina Snell on 10/06/22 and after exchanging multiple MyChart messages, the MRI arthrogram was ordered. Today, pt reports L hip is feeling pretty good. Pt c/o not being able to ride her horse due to the hip pain.   Dx imaging: 11/10/22 L hip MRI arthrogram  07/24/22 L-spine MRI  04/10/22 Pelvis & L-spine XR  Pertinent review of systems: ***  Relevant historical information: ***   Exam:  There were no vitals taken for this visit. General: Well Developed, well nourished, and in no acute distress.   MSK: ***    Lab and Radiology Results No results found for this or any previous visit (from the past 72 hour(s)). MR HIP LEFT W CONTRAST  Result Date: 11/11/2022 CLINICAL DATA:  Persistent left hip pain.  Concern for labral tear. EXAM: MRI OF THE LEFT HIP WITH CONTRAST TECHNIQUE: Multiplanar, multisequence MR imaging was performed following the administration of intravenous contrast. CONTRAST:  38m GADAVIST GADOBUTROL 1 MMOL/ML IV SOLN COMPARISON:  None Available. FINDINGS: Bone Advanced left hip osteoarthritis with subchondral edema of the acetabular lip and femoral head which extends inferiorly to the femoral neck. Complete loss of the articular cartilage in the anterosuperior hip joint with advanced degeneration of the labrum. Moderate-sized femoral head and neck junction and acetabular lip osteophytes. No evidence of fracture or dislocation. Intra-articular contrast distends the left hip joint capsular, otherwise no appreciable joint effusion or inflammatory changes about the hip joint. Mild-to-moderate right hip osteoarthritis. Capsule and ligaments Normal. Muscles and Tendons Flexors: Normal. Extensors: Normal. Abductors: Normal. Adductors: Normal.  Rotators: Normal. Hamstrings: Normal. Other Findings No bursal fluid. Viscera No abnormality seen in pelvis. No lymphadenopathy. No free fluid in the pelvis. IMPRESSION: 1. Advanced left hip osteoarthritis with subchondral edema of the acetabular lip and femoral head which extends inferiorly to the femoral neck. Differential also includes subchondral insufficiency fracture. 2. Moderate degeneration of the left hip labrum. No evidence of acute labral tear. 3. Mild-to-moderate right hip osteoarthritis. Electronically Signed   By: IKeane PoliceD.O.   On: 11/11/2022 15:32   [place]ITED JOINT SPACE STRUCTURES LOW LEFT  Result Date: 11/10/2022 Procedure: Real-time Ultrasound Guided gadolinium contrast injection of left hip joint Device: Samsung HS60 Verbal informed consent obtained. Time-out conducted. Noted no overlying erythema, induration, or other signs of local infection. Skin prepped in a sterile fashion. Local anesthesia: Topical Ethyl chloride. With sterile technique and under real time ultrasound guidance: Noted arthritic hip joint, 3 cc lidocaine, 3 cc bupivacaine injected easily, syringe switched and 0.1 cc gadolinium injected, syringe again switched and 10 cc sterile saline used to fully distend the joint. Joint visualized and capsule seen distending confirming intra-articular placement of contrast material and medication. Completed without difficulty Advised to call if fevers/chills, erythema, induration, drainage, or persistent bleeding. Images permanently stored in PACS Impression: Technically successful ultrasound guided gadolinium contrast injection for MR arthrography.  Please see separate MR arthrogram report.       Assessment and Plan: 66y.o. female with ***   PDMP not reviewed this encounter. No orders of the defined types were placed in this encounter.  No orders of the defined types were placed in this encounter.    Discussed warning signs or symptoms. Please  see discharge  instructions. Patient expresses understanding.   ***

## 2022-11-14 ENCOUNTER — Encounter: Payer: Self-pay | Admitting: Family Medicine

## 2022-11-19 ENCOUNTER — Ambulatory Visit: Payer: BC Managed Care – PPO | Admitting: Orthopaedic Surgery

## 2022-11-19 ENCOUNTER — Ambulatory Visit (INDEPENDENT_AMBULATORY_CARE_PROVIDER_SITE_OTHER)
Admission: RE | Admit: 2022-11-19 | Discharge: 2022-11-19 | Disposition: A | Discharge status: Home / Self Care | Payer: BC Managed Care – PPO | Source: Ambulatory Visit | Attending: Family Medicine | Admitting: Family Medicine

## 2022-11-19 DIAGNOSIS — M8440XA Pathological fracture, unspecified site, initial encounter for fracture: Secondary | ICD-10-CM

## 2022-11-19 DIAGNOSIS — M1612 Unilateral primary osteoarthritis, left hip: Secondary | ICD-10-CM

## 2022-11-19 DIAGNOSIS — Z780 Asymptomatic menopausal state: Secondary | ICD-10-CM

## 2022-11-19 NOTE — Progress Notes (Signed)
Office Visit Note   Patient: Jill [name]           Date of Birth: 12/02/1955           MRN: [id number] Visit Date: 11/19/2022              Requested by: Gregor Hams, MD [place],  [place] [address] PCP: Susy Frizzle, MD   Assessment & Plan: Visit Diagnoses:  1. Primary osteoarthritis of left hip     Plan: Impression is severe osteoarthritis left hip.  MRI reviewed which shows full-thickness cartilage loss of the articular surfaces.  Her symptoms are significantly interfering with ADLs and quality of life specially when it comes to riding her horses.  We briefly talked about details of surgery including risk benefits alternatives.  I provided her with information on hip replacement surgery.  She will think about her options for now.  Will see her back as needed.  I have given her my card.  Follow-Up Instructions: No follow-ups on file.   Orders:  No orders of the defined types were placed in this encounter.  No orders of the defined types were placed in this encounter.     Procedures: No procedures performed   Clinical Data: No additional findings.   Subjective: Chief Complaint  Patient presents with   Left Hip - Pain    HPI Jill [name] is a very pleasant 66 year old female referral from Dr. Georgina Snell for chronic left hip pain.  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  She has had pain for about a year and she is not able to ride her horses or swing her leg over the course.  She takes Tylenol.  She is not interested in cortisone injections.  She has done physical therapy and done Tylenol with without significant relief.  She is here for surgical consultation.  She is having trouble with ADLs and quality of life as well.  Review of Systems  Constitutional: Negative.   HENT: Negative.    Eyes: Negative.   Respiratory: Negative.    Cardiovascular: Negative.   Endocrine: Negative.   Musculoskeletal: Negative.    Neurological: Negative.   Hematological: Negative.   Psychiatric/Behavioral: Negative.    All other systems reviewed and are negative.    Objective: Vital Signs: There were no vitals taken for this visit.  Physical Exam Vitals and nursing note reviewed.  Constitutional:      Appearance: She is well-developed.  HENT:     Head: Atraumatic.     Nose: Nose normal.  Eyes:     Extraocular Movements: Extraocular movements intact.  Cardiovascular:     Pulses: Normal pulses.  Pulmonary:     Effort: Pulmonary effort is normal.  Abdominal:     Palpations: Abdomen is soft.  Musculoskeletal:     Cervical back: Neck supple.  Skin:    General: Skin is warm.     Capillary Refill: Capillary refill takes less than 2 seconds.  Neurological:     Mental Status: She is alert. Mental status is at baseline.  Psychiatric:        Behavior: Behavior normal.        Thought Content: Thought content normal.        Judgment: Judgment normal.     Ortho Exam Examination of the left hip shows pain with range of motion.  Positive Stinchfield sign.  Pain with hip flexion and FADIR. Specialty Comments:  No specialty  comments available.  Imaging: DG BONE DENSITY (DXA)  Result Date: 11/19/2022 Table formatting from the original result was not included. Date of study: 11/19/2022 Exam: DUAL X-RAY ABSORPTIOMETRY (DXA) FOR BONE MINERAL DENSITY (BMD) Instrument: [employer] Requesting Provider: PCP Indication: Screening for low BMD Comparison: none (please note that it is not possible to compare data from different instruments) Clinical data: Pt is a 66 y.o. female with previous history of vertebral and upper extremity fracture. On calcium and vitamin D. Results:  Lumbar spine (L1) L2-L4 Femoral neck (FN) 33% distal radius T-score -2.1 RFN: -2.2 LFN: -1.8 n/a Assessment: the BMD is low according to the [facility] classification for osteoporosis (see below). Fracture risk: high due to previous fracture history  FRAX score: 10 year major osteoporotic risk: 11.6%. 10 year hip fracture risk: 2.0%. The thresholds for treatment are 20% and 3%, respectively. Comments: the technical quality of the study is good. Evaluation for secondary causes should be considered if clinically indicated. Recommend optimizing calcium (1200 mg/day) and vitamin D (800 IU/day) intake. Followup: Repeat BMD is appropriate after 1-2 years WHO criteria for diagnosis of osteoporosis in postmenopausal women and in men 42 y/o or older: - normal: T-score -1.0 to + 1.0 - osteopenia/low bone density: T-score between -2.5 and -1.0 - osteoporosis: T-score below -2.5 - severe osteoporosis: T-score below -2.5 with history of fragility fracture Note: although not part of the WHO classification, the presence of a fragility fracture, regardless of the T-score, should be considered diagnostic of osteoporosis, provided other causes for the fracture have been excluded. Treatment: The National Osteoporosis Foundation recommends that treatment be considered in postmenopausal women and men age 47 or older with: 1. Hip or vertebral (clinical or morphometric) fracture 2. T-score of - 2.5 or lower at the spine or hip 3. 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   Chest pain 03/31/2017   Prediabetes 03/31/2017   Abdominal pain    Pancreatitis 03/27/2017   Hypertensive urgency 03/24/2017   Sinusitis 03/24/2017   Leukocytosis 03/24/2017   Headache 03/24/2017   Hyperglycemia 03/24/2017   Atrial tachycardia, paroxysmal 03/24/2017   HLD (hyperlipidemia) 10/29/2016   Past Medical History:   Diagnosis Date   Arthritis    "hands" (03/24/2017)   Colon polyp    High cholesterol    Multifocal atrial tachycardia    PAT (paroxysmal atrial tachycardia)    [name] 03/24/2017   Peptic ulcer    "when I was a child"   PONV (postoperative nausea and vomiting)    "bad when they did my wisdom teeth"   Pre-diabetes    Seasonal allergies     Family History  Problem Relation Age of Onset   Dementia Mother    Diabetes Father    Heart disease Father    Hypertension Father    Cancer Sister        endometrial   Cancer Maternal Aunt        ovarian   Adrenal disorder Neg Hx     Past Surgical History:  Procedure Laterality Date   CERVICAL CERCLAGE  X 2   COLONOSCOPY W/ BIOPSIES  AND POLYPECTOMY     TONSILLECTOMY     WISDOM TOOTH EXTRACTION     Social History   Occupational History   Not on file  Tobacco Use   Smoking status: Never   Smokeless tobacco: Never  Substance and Sexual Activity   Alcohol use: Yes    Comment: 03/24/2017 "might average 1 drink/month"   Drug use: No   Sexual activity: Yes

## 2022-11-21 ENCOUNTER — Encounter: Payer: Self-pay | Admitting: Family Medicine

## 2022-11-21 NOTE — Progress Notes (Signed)
Bone density test shows osteopenia or medium low bone density.  You should definitely be taking vitamin D supplementation.  Typically I recommend over-the-counter vitamin D3 somewhere between 1000 and 1000 units daily.

## 2022-11-28 ENCOUNTER — Encounter: Payer: Self-pay | Admitting: Orthopaedic Surgery

## 2023-01-01 DIAGNOSIS — Z113 Encounter for screening for infections with a predominantly sexual mode of transmission: Secondary | ICD-10-CM | POA: Diagnosis not present

## 2023-01-01 DIAGNOSIS — Z01411 Encounter for gynecological examination (general) (routine) with abnormal findings: Secondary | ICD-10-CM | POA: Diagnosis not present

## 2023-01-01 DIAGNOSIS — Z124 Encounter for screening for malignant neoplasm of cervix: Secondary | ICD-10-CM | POA: Diagnosis not present

## 2023-01-01 DIAGNOSIS — Z01419 Encounter for gynecological examination (general) (routine) without abnormal findings: Secondary | ICD-10-CM | POA: Diagnosis not present

## 2023-01-01 DIAGNOSIS — Z8049 Family history of malignant neoplasm of other genital organs: Secondary | ICD-10-CM | POA: Diagnosis not present

## 2023-01-01 DIAGNOSIS — Z6824 Body mass index (BMI) 24.0-24.9, adult: Secondary | ICD-10-CM | POA: Diagnosis not present

## 2023-01-01 DIAGNOSIS — N950 Postmenopausal bleeding: Secondary | ICD-10-CM | POA: Diagnosis not present

## 2023-01-01 DIAGNOSIS — Z779 Other contact with and (suspected) exposures hazardous to health: Secondary | ICD-10-CM | POA: Diagnosis not present

## 2023-01-06 DIAGNOSIS — N950 Postmenopausal bleeding: Secondary | ICD-10-CM | POA: Diagnosis not present

## 2023-01-06 DIAGNOSIS — Z8049 Family history of malignant neoplasm of other genital organs: Secondary | ICD-10-CM | POA: Diagnosis not present

## 2023-01-06 DIAGNOSIS — N858 Other specified noninflammatory disorders of uterus: Secondary | ICD-10-CM | POA: Diagnosis not present

## 2023-01-06 DIAGNOSIS — Z1231 Encounter for screening mammogram for malignant neoplasm of breast: Secondary | ICD-10-CM | POA: Diagnosis not present

## 2023-01-06 LAB — HM MAMMOGRAPHY

## 2023-01-07 LAB — HM PAP SMEAR
HM Pap smear: NEGATIVE
HPV, high-risk: NEGATIVE

## 2023-01-12 ENCOUNTER — Other Ambulatory Visit: Payer: Self-pay | Admitting: Family Medicine

## 2023-01-12 DIAGNOSIS — Z1231 Encounter for screening mammogram for malignant neoplasm of breast: Secondary | ICD-10-CM

## 2023-01-16 ENCOUNTER — Ambulatory Visit: Payer: PPO

## 2023-01-16 ENCOUNTER — Ambulatory Visit (INDEPENDENT_AMBULATORY_CARE_PROVIDER_SITE_OTHER): Payer: PPO

## 2023-01-16 DIAGNOSIS — M1612 Unilateral primary osteoarthritis, left hip: Secondary | ICD-10-CM

## 2023-01-22 ENCOUNTER — Other Ambulatory Visit: Payer: Self-pay

## 2023-02-07 ENCOUNTER — Encounter: Payer: Self-pay | Admitting: Orthopaedic Surgery

## 2023-02-09 ENCOUNTER — Encounter: Payer: Self-pay | Admitting: Orthopaedic Surgery

## 2023-02-18 ENCOUNTER — Other Ambulatory Visit: Payer: Self-pay | Admitting: Physician Assistant

## 2023-02-18 MED ORDER — ASPIRIN 81 MG PO TBEC: 81.0000 mg | DELAYED_RELEASE_TABLET | Freq: Two times a day (BID) | ORAL | 0 refills | Status: DC

## 2023-02-18 MED ORDER — ONDANSETRON HCL 4 MG PO TABS: 4.0000 mg | ORAL_TABLET | Freq: Three times a day (TID) | ORAL | 0 refills | Status: DC | PRN

## 2023-02-18 MED ORDER — OXYCODONE-ACETAMINOPHEN 5-325 MG PO TABS: 1.0000 | ORAL_TABLET | Freq: Four times a day (QID) | ORAL | 0 refills | Status: DC | PRN

## 2023-02-18 MED ORDER — METHOCARBAMOL 750 MG PO TABS: 750.0000 mg | ORAL_TABLET | Freq: Two times a day (BID) | ORAL | 2 refills | Status: DC | PRN

## 2023-02-18 MED ORDER — DOCUSATE SODIUM 100 MG PO CAPS: 100.0000 mg | ORAL_CAPSULE | Freq: Every day | ORAL | 2 refills | Status: DC | PRN

## 2023-02-20 ENCOUNTER — Encounter: Payer: Self-pay | Admitting: Orthopaedic Surgery

## 2023-02-24 ENCOUNTER — Telehealth: Payer: Self-pay | Admitting: Orthopaedic Surgery

## 2023-02-24 NOTE — Pre-Procedure Instructions (Signed)
Surgical Instructions    Your procedure is scheduled on Monday, April 1st.  Report to [facility] Main Entrance "A" at 5:30 A.M., then check in with the Admitting office.  Call this number if you have problems the morning of surgery:  [phone]  If you have any questions prior to your surgery date call [phone]: Open Monday-Friday 8am-4pm If you experience any cold or flu symptoms such as cough, fever, chills, shortness of breath, etc. between now and your scheduled surgery, please notify us at the above number.     Remember:  Do not eat after midnight the night before your surgery  You may drink clear liquids until 4:15 a.m. the morning of your surgery.   Clear liquids allowed are: Water, Non-Citrus Juices (without pulp), Carbonated Beverages, Clear Tea, Black Coffee Only (NO MILK, CREAM OR POWDERED CREAMER of any kind), and Gatorade.    Patient Instructions   The day of surgery (if you have diabetes):  Drink ONE small 12 oz bottle of Gatorade G2 by 4:15 am the morning of surgery This bottle was given to you during your hospital  pre-op appointment visit.  Nothing else to drink after completing the  Small 12 oz bottle of Gatorade G2.         If you have questions, please contact your surgeon's office.   Take these medicines the morning of surgery with A SIP OF WATER  valACYclovir (VALTREX)   As of today, STOP taking any Aspirin (unless otherwise instructed by your surgeon) Aleve, Naproxen, Ibuprofen, Motrin, Advil, Goody's, BC's, all herbal medications, fish oil, and all vitamins.                     Do NOT Smoke (Tobacco/Vaping) for 24 hours prior to your procedure.  If you use a CPAP at night, you may bring your mask/headgear for your overnight stay.   Contacts, glasses, piercing's, hearing aid's, dentures or partials may not be worn into surgery, please bring cases for these belongings.    For patients admitted to the hospital, discharge time will be determined by  your treatment team.   Patients discharged the day of surgery will not be allowed to drive home, and someone needs to stay with them for 24 hours.  SURGICAL WAITING ROOM VISITATION Patients having surgery or a procedure may have no more than 2 support people in the waiting area - these visitors may rotate.   Children under the age of 52 must have an adult with them who is not the patient. If the patient needs to stay at the hospital during part of their recovery, the visitor guidelines for inpatient rooms apply. Pre-op nurse will coordinate an appropriate time for 1 support person to accompany patient in pre-op.  This support person may not rotate.   Please refer to the [facility] website for the visitor guidelines for Inpatients (after your surgery is over and you are in a regular room).    Special instructions:   [place]- Preparing For Surgery  Before surgery, you can play an important role. Because skin is not sterile, your skin needs to be as free of germs as possible. You can reduce the number of germs on your skin by washing with CHG (chlorahexidine gluconate) Soap before surgery.  CHG is an antiseptic cleaner which kills germs and bonds with the skin to continue killing germs even after washing.    Oral Hygiene is also important to reduce your risk of infection.  Remember -  BRUSH YOUR TEETH THE MORNING OF SURGERY WITH YOUR REGULAR TOOTHPASTE  Please do not use if you have an allergy to CHG or antibacterial soaps. If your skin becomes reddened/irritated stop using the CHG.  Do not shave (including legs and underarms) for at least 48 hours prior to first CHG shower. It is OK to shave your face.  Please follow these instructions carefully.   Shower the NIGHT BEFORE SURGERY and the MORNING OF SURGERY  If you chose to wash your hair, wash your hair first as usual with your normal shampoo.  After you shampoo, rinse your hair and body thoroughly to remove the shampoo.  Use CHG Soap  as you would any other liquid soap. You can apply CHG directly to the skin and wash gently with a scrungie or a clean washcloth.   Apply the CHG Soap to your body ONLY FROM THE NECK DOWN.  Do not use on open wounds or open sores. Avoid contact with your eyes, ears, mouth and genitals (private parts). Wash Face and genitals (private parts)  with your normal soap.   Wash thoroughly, paying special attention to the area where your surgery will be performed.  Thoroughly rinse your body with warm water from the neck down.  DO NOT shower/wash with your normal soap after using and rinsing off the CHG Soap.  Pat yourself dry with a CLEAN TOWEL.  Wear CLEAN PAJAMAS to bed the night before surgery  Place CLEAN SHEETS on your bed the night before your surgery  DO NOT SLEEP WITH PETS.   Day of Surgery: Take a shower with CHG soap. Do not wear jewelry or makeup Do not wear lotions, powders, perfumes, or deodorant. Do not shave 48 hours prior to surgery.   Do not bring valuables to the hospital.  [facility] is not responsible for any belongings or valuables. Do not wear nail polish, gel polish, artificial nails, or any other type of covering on natural nails (fingers and toes) If you have artificial nails or gel coating that need to be removed by a nail salon, please have this removed prior to surgery. Artificial nails or gel coating may interfere with anesthesia's ability to adequately monitor your vital signs. Wear Clean/Comfortable clothing the morning of surgery Remember to brush your teeth WITH YOUR REGULAR TOOTHPASTE.   Please read over the following fact sheets that you were given.    If you received a COVID test during your pre-op visit  it is requested that you wear a mask when out in public, stay away from anyone that may not be feeling well and notify your surgeon if you develop symptoms. If you have been in contact with anyone that has tested positive in the last 10 days please  notify you surgeon.

## 2023-02-24 NOTE — Telephone Encounter (Signed)
Patient requesting Phenergan instead of Zofran

## 2023-02-25 ENCOUNTER — Other Ambulatory Visit: Payer: Self-pay

## 2023-02-25 ENCOUNTER — Ambulatory Visit (HOSPITAL_BASED_OUTPATIENT_CLINIC_OR_DEPARTMENT_OTHER): Payer: Self-pay | Admitting: Orthopaedic Surgery

## 2023-02-25 ENCOUNTER — Encounter (HOSPITAL_COMMUNITY)
Admission: RE | Admit: 2023-02-25 | Discharge: 2023-02-25 | Disposition: A | Discharge status: Home / Self Care | Payer: PPO | Source: Ambulatory Visit | Attending: Orthopaedic Surgery | Admitting: Orthopaedic Surgery

## 2023-02-25 ENCOUNTER — Encounter (HOSPITAL_COMMUNITY): Payer: Self-pay

## 2023-02-25 ENCOUNTER — Other Ambulatory Visit (HOSPITAL_BASED_OUTPATIENT_CLINIC_OR_DEPARTMENT_OTHER): Payer: Self-pay | Admitting: Orthopaedic Surgery

## 2023-02-25 VITALS — BP 134/74 | HR 72 | Temp 98.2°F | Resp 18 | Ht 63.0 in | Wt 144.6 lb

## 2023-02-25 DIAGNOSIS — I2510 Atherosclerotic heart disease of native coronary artery without angina pectoris: Secondary | ICD-10-CM | POA: Diagnosis not present

## 2023-02-25 DIAGNOSIS — M1612 Unilateral primary osteoarthritis, left hip: Secondary | ICD-10-CM | POA: Insufficient documentation

## 2023-02-25 DIAGNOSIS — Z01818 Encounter for other preprocedural examination: Secondary | ICD-10-CM | POA: Diagnosis not present

## 2023-02-25 HISTORY — DX: Benign neoplasm of unspecified adrenal gland: D35.00

## 2023-02-25 HISTORY — DX: Cyst of pancreas: K86.2

## 2023-02-25 LAB — CBC
HCT: 43.5 % (ref 36.0–46.0)
Hemoglobin: 14.1 g/dL (ref 12.0–15.0)
MCH: 26.9 pg (ref 26.0–34.0)
MCHC: 32.4 g/dL (ref 30.0–36.0)
MCV: 82.9 fL (ref 80.0–100.0)
Platelets: 262 10*3/uL (ref 150–400)
RBC: 5.25 MIL/uL — ABNORMAL HIGH (ref 3.87–5.11)
RDW: 13.3 % (ref 11.5–15.5)
WBC: 6.1 10*3/uL (ref 4.0–10.5)
nRBC: 0 % (ref 0.0–0.2)

## 2023-02-25 LAB — BASIC METABOLIC PANEL
Anion gap: 13 (ref 5–15)
BUN: 14 mg/dL (ref 8–23)
CO2: 24 mmol/L (ref 22–32)
Calcium: 9.9 mg/dL (ref 8.9–10.3)
Chloride: 99 mmol/L (ref 98–111)
Creatinine, Ser: 0.65 mg/dL (ref 0.44–1.00)
GFR, Estimated: >60 mL/min (ref >60)
Glucose, Bld: 86 mg/dL (ref 70–99)
Potassium: 3.9 mmol/L (ref 3.5–5.1)
Sodium: 136 mmol/L (ref 135–145)

## 2023-02-25 LAB — SURGICAL PCR SCREEN
MRSA, PCR: NEGATIVE
Staphylococcus aureus: NEGATIVE

## 2023-02-25 LAB — TYPE AND SCREEN
ABO/RH(D): O POS
Antibody Screen: NEGATIVE

## 2023-02-25 MED ORDER — PROMETHAZINE HCL 12.5 MG PO TABS: 12.5000 mg | ORAL_TABLET | Freq: Four times a day (QID) | ORAL | 0 refills | Status: DC | PRN

## 2023-02-25 NOTE — Pre-Procedure Instructions (Signed)
Surgical Instructions    Your procedure is scheduled on Monday, April 1st.  Report to [facility] Main Entrance "A" at 5:30 A.M., then check in with the Admitting office.  Call this number if you have problems the morning of surgery:  [phone]  If you have any questions prior to your surgery date call [phone]: Open Monday-Friday 8am-4pm If you experience any cold or flu symptoms such as cough, fever, chills, shortness of breath, etc. between now and your scheduled surgery, please notify us at the above number.     Remember:  Do not eat after midnight the night before your surgery  You may drink clear liquids until 4:15 a.m. the morning of your surgery.   Clear liquids allowed are: Water, Non-Citrus Juices (without pulp), Carbonated Beverages, Clear Tea, Black Coffee Only (NO MILK, CREAM OR POWDERED CREAMER of any kind), and Gatorade.    Patient Instructions   The day of surgery (if you have diabetes):  Drink ONE small 12 oz bottle of Gatorade G2 by 4:15 am the morning of surgery This bottle was given to you during your hospital  pre-op appointment visit.  Nothing else to drink after completing the  Small 12 oz bottle of Gatorade G2.         If you have questions, please contact your surgeon's office.   Take these medicines the morning of surgery with A SIP OF WATER:  Tylenol if needed   As of today, STOP taking any Aspirin (unless otherwise instructed by your surgeon) Aleve, Naproxen, Ibuprofen, Motrin, Advil, Goody's, BC's, all herbal medications, fish oil, and all vitamins.                     Do NOT Smoke (Tobacco/Vaping) for 24 hours prior to your procedure.  If you use a CPAP at night, you may bring your mask/headgear for your overnight stay.   Contacts, glasses, piercing's, hearing aid's, dentures or partials may not be worn into surgery, please bring cases for these belongings.    For patients admitted to the hospital, discharge time will be determined by  your treatment team.   Patients discharged the day of surgery will not be allowed to drive home, and someone needs to stay with them for 24 hours.  SURGICAL WAITING ROOM VISITATION Patients having surgery or a procedure may have no more than 2 support people in the waiting area - these visitors may rotate.   Children under the age of 57 must have an adult with them who is not the patient. If the patient needs to stay at the hospital during part of their recovery, the visitor guidelines for inpatient rooms apply. Pre-op nurse will coordinate an appropriate time for 1 support person to accompany patient in pre-op.  This support person may not rotate.   Please refer to the [facility] website for the visitor guidelines for Inpatients (after your surgery is over and you are in a regular room).    Special instructions:   [place]- Preparing For Surgery  Before surgery, you can play an important role. Because skin is not sterile, your skin needs to be as free of germs as possible. You can reduce the number of germs on your skin by washing with CHG (chlorahexidine gluconate) Soap before surgery.  CHG is an antiseptic cleaner which kills germs and bonds with the skin to continue killing germs even after washing.    Oral Hygiene is also important to reduce your risk of infection.  Remember -  BRUSH YOUR TEETH THE MORNING OF SURGERY WITH YOUR REGULAR TOOTHPASTE  Please do not use if you have an allergy to CHG or antibacterial soaps. If your skin becomes reddened/irritated stop using the CHG.  Do not shave (including legs and underarms) for at least 48 hours prior to first CHG shower. It is OK to shave your face.  Please follow these instructions carefully.   Shower the NIGHT BEFORE SURGERY and the MORNING OF SURGERY  If you chose to wash your hair, wash your hair first as usual with your normal shampoo.  After you shampoo, rinse your hair and body thoroughly to remove the shampoo.  Use CHG Soap  as you would any other liquid soap. You can apply CHG directly to the skin and wash gently with a scrungie or a clean washcloth.   Apply the CHG Soap to your body ONLY FROM THE NECK DOWN.  Do not use on open wounds or open sores. Avoid contact with your eyes, ears, mouth and genitals (private parts). Wash Face and genitals (private parts)  with your normal soap.   Wash thoroughly, paying special attention to the area where your surgery will be performed.  Thoroughly rinse your body with warm water from the neck down.  DO NOT shower/wash with your normal soap after using and rinsing off the CHG Soap.  Pat yourself dry with a CLEAN TOWEL.  Wear CLEAN PAJAMAS to bed the night before surgery  Place CLEAN SHEETS on your bed the night before your surgery  DO NOT SLEEP WITH PETS.   Day of Surgery: Take a shower with CHG soap. Do not wear jewelry or makeup Do not wear lotions, powders, perfumes, or deodorant. Do not shave 48 hours prior to surgery.   Do not bring valuables to the hospital.  [facility] is not responsible for any belongings or valuables. Do not wear nail polish, gel polish, artificial nails, or any other type of covering on natural nails (fingers and toes) If you have artificial nails or gel coating that need to be removed by a nail salon, please have this removed prior to surgery. Artificial nails or gel coating may interfere with anesthesia's ability to adequately monitor your vital signs. Wear Clean/Comfortable clothing the morning of surgery Remember to brush your teeth WITH YOUR REGULAR TOOTHPASTE.   Please read over the following fact sheets that you were given.    If you received a COVID test during your pre-op visit  it is requested that you wear a mask when out in public, stay away from anyone that may not be feeling well and notify your surgeon if you develop symptoms. If you have been in contact with anyone that has tested positive in the last 10 days please  notify you surgeon.

## 2023-02-26 ENCOUNTER — Telehealth: Payer: Self-pay | Admitting: *Deleted

## 2023-02-26 NOTE — Telephone Encounter (Signed)
Attempted Ortho bundle pre-op call; left VM requesting call back.

## 2023-02-27 ENCOUNTER — Telehealth: Payer: Self-pay | Admitting: *Deleted

## 2023-02-27 MED ORDER — TRANEXAMIC ACID 1000 MG/10ML IV SOLN
2000.0000 mg | INTRAVENOUS | Status: DC
  Filled 2023-02-27: qty 20

## 2023-02-27 NOTE — Telephone Encounter (Signed)
Ortho bundle pre-op call completed. 

## 2023-02-27 NOTE — Care Plan (Signed)
[facility] RNCM call to patient to discuss her upcoming Left total hip arthroplasty with Dr. Erlinda Hong on 03/02/23. She is an Ortho bundle patient through [facility] and is agreeable to case management. She lives with her spouse, who will be assisting after discharge. She will need a RW before discharge. Anticipate HHPT will be needed after a short hospital stay. Referral made to [facility] after choice provided. Reviewed all post op care instructions. Will continue to follow for needs.

## 2023-03-02 ENCOUNTER — Other Ambulatory Visit: Payer: Self-pay

## 2023-03-02 ENCOUNTER — Other Ambulatory Visit: Payer: Self-pay | Admitting: Physician Assistant

## 2023-03-02 ENCOUNTER — Ambulatory Visit (HOSPITAL_COMMUNITY): Payer: PPO

## 2023-03-02 ENCOUNTER — Ambulatory Visit (HOSPITAL_COMMUNITY): Payer: PPO | Admitting: Certified Registered"

## 2023-03-02 ENCOUNTER — Encounter (HOSPITAL_COMMUNITY)
Admission: RE | Disposition: A | Discharge status: Home / Self Care | Payer: Self-pay | Source: Home / Self Care | Attending: Orthopaedic Surgery

## 2023-03-02 ENCOUNTER — Observation Stay (HOSPITAL_COMMUNITY)
Admission: RE | Admit: 2023-03-02 | Discharge: 2023-03-03 | Disposition: A | Discharge status: Home / Self Care | Payer: PPO | Attending: Orthopaedic Surgery | Admitting: Orthopaedic Surgery

## 2023-03-02 ENCOUNTER — Observation Stay (HOSPITAL_COMMUNITY): Payer: PPO

## 2023-03-02 ENCOUNTER — Encounter (HOSPITAL_COMMUNITY): Payer: Self-pay | Admitting: Orthopaedic Surgery

## 2023-03-02 ENCOUNTER — Ambulatory Visit (HOSPITAL_BASED_OUTPATIENT_CLINIC_OR_DEPARTMENT_OTHER): Payer: PPO | Admitting: Certified Registered"

## 2023-03-02 DIAGNOSIS — Z7982 Long term (current) use of aspirin: Secondary | ICD-10-CM | POA: Insufficient documentation

## 2023-03-02 DIAGNOSIS — M1612 Unilateral primary osteoarthritis, left hip: Secondary | ICD-10-CM

## 2023-03-02 DIAGNOSIS — Z96642 Presence of left artificial hip joint: Secondary | ICD-10-CM | POA: Diagnosis not present

## 2023-03-02 DIAGNOSIS — Z0389 Encounter for observation for other suspected diseases and conditions ruled out: Secondary | ICD-10-CM | POA: Diagnosis not present

## 2023-03-02 DIAGNOSIS — Z471 Aftercare following joint replacement surgery: Secondary | ICD-10-CM | POA: Diagnosis not present

## 2023-03-02 HISTORY — PX: TOTAL HIP ARTHROPLASTY: SHX124

## 2023-03-02 LAB — ABO/RH: ABO/RH(D): O POS

## 2023-03-02 SURGERY — ARTHROPLASTY, HIP, TOTAL, ANTERIOR APPROACH
Anesthesia: Spinal | Site: Hip | Laterality: Left

## 2023-03-02 MED ORDER — HYDROMORPHONE HCL 1 MG/ML IJ SOLN
INTRAMUSCULAR | Status: AC
  Filled 2023-03-02: qty 1

## 2023-03-02 MED ORDER — CEFAZOLIN SODIUM-DEXTROSE 2-4 GM/100ML-% IV SOLN
2.0000 g | Freq: Four times a day (QID) | INTRAVENOUS | Status: AC
  Administered 2023-03-02 (×2): 2 g via INTRAVENOUS
  Filled 2023-03-02 (×2): qty 100

## 2023-03-02 MED ORDER — PHENYLEPHRINE 80 MCG/ML (10ML) SYRINGE FOR IV PUSH (FOR BLOOD PRESSURE SUPPORT)
PREFILLED_SYRINGE | INTRAVENOUS | Status: AC
  Filled 2023-03-02: qty 10

## 2023-03-02 MED ORDER — CEFAZOLIN SODIUM-DEXTROSE 2-4 GM/100ML-% IV SOLN
2.0000 g | INTRAVENOUS | Status: AC
  Administered 2023-03-02: 2 g via INTRAVENOUS
  Filled 2023-03-02: qty 100

## 2023-03-02 MED ORDER — DEXAMETHASONE SODIUM PHOSPHATE 10 MG/ML IJ SOLN
INTRAMUSCULAR | Status: AC
  Filled 2023-03-02: qty 1

## 2023-03-02 MED ORDER — EPHEDRINE 5 MG/ML INJ
INTRAVENOUS | Status: AC
  Filled 2023-03-02: qty 5

## 2023-03-02 MED ORDER — HYDROMORPHONE HCL 1 MG/ML IJ SOLN: 0.5000 mg | INTRAMUSCULAR | Status: DC | PRN

## 2023-03-02 MED ORDER — OXYCODONE HCL 5 MG PO TABS: 10.0000 mg | ORAL_TABLET | ORAL | Status: DC | PRN

## 2023-03-02 MED ORDER — MENTHOL 3 MG MT LOZG: 1.0000 | LOZENGE | OROMUCOSAL | Status: DC | PRN

## 2023-03-02 MED ORDER — PROMETHAZINE HCL 25 MG/ML IJ SOLN
INTRAMUSCULAR | Status: AC
  Filled 2023-03-02: qty 1

## 2023-03-02 MED ORDER — SORBITOL 70 % SOLN: 30.0000 mL | Freq: Every day | Status: DC | PRN

## 2023-03-02 MED ORDER — ONDANSETRON HCL 4 MG/2ML IJ SOLN
INTRAMUSCULAR | Status: DC | PRN
  Administered 2023-03-02: 4 mg via INTRAVENOUS

## 2023-03-02 MED ORDER — TRANEXAMIC ACID-NACL 1000-0.7 MG/100ML-% IV SOLN
1000.0000 mg | Freq: Once | INTRAVENOUS | Status: AC
  Administered 2023-03-02: 1000 mg via INTRAVENOUS
  Filled 2023-03-02: qty 100

## 2023-03-02 MED ORDER — CHLORHEXIDINE GLUCONATE 0.12 % MT SOLN
15.0000 mL | Freq: Once | OROMUCOSAL | Status: AC
  Administered 2023-03-02: 15 mL via OROMUCOSAL
  Filled 2023-03-02: qty 15

## 2023-03-02 MED ORDER — LACTATED RINGERS IV SOLN: INTRAVENOUS | Status: DC

## 2023-03-02 MED ORDER — BUPIVACAINE IN DEXTROSE 0.75-8.25 % IT SOLN
INTRATHECAL | Status: DC | PRN
  Administered 2023-03-02: 1.6 mL via INTRATHECAL

## 2023-03-02 MED ORDER — DEXAMETHASONE SODIUM PHOSPHATE 10 MG/ML IJ SOLN
10.0000 mg | Freq: Once | INTRAMUSCULAR | Status: DC
  Filled 2023-03-02: qty 1

## 2023-03-02 MED ORDER — DEXAMETHASONE SODIUM PHOSPHATE 10 MG/ML IJ SOLN
INTRAMUSCULAR | Status: DC | PRN
  Administered 2023-03-02: 10 mg via INTRAVENOUS

## 2023-03-02 MED ORDER — ACETAMINOPHEN 500 MG PO TABS
1000.0000 mg | ORAL_TABLET | Freq: Four times a day (QID) | ORAL | Status: AC
  Administered 2023-03-02 – 2023-03-03 (×4): 1000 mg via ORAL
  Filled 2023-03-02 (×4): qty 2

## 2023-03-02 MED ORDER — PROPOFOL 10 MG/ML IV BOLUS
INTRAVENOUS | Status: AC
  Filled 2023-03-02: qty 20

## 2023-03-02 MED ORDER — MIDAZOLAM HCL 2 MG/2ML IJ SOLN
INTRAMUSCULAR | Status: DC | PRN
  Administered 2023-03-02: 2 mg via INTRAVENOUS

## 2023-03-02 MED ORDER — PROMETHAZINE HCL 25 MG/ML IJ SOLN
6.2500 mg | INTRAMUSCULAR | Status: AC | PRN
  Administered 2023-03-02 (×2): 12.5 mg via INTRAVENOUS

## 2023-03-02 MED ORDER — ACETAMINOPHEN 325 MG PO TABS: 325.0000 mg | ORAL_TABLET | Freq: Four times a day (QID) | ORAL | Status: DC | PRN

## 2023-03-02 MED ORDER — PHENYLEPHRINE HCL-NACL 20-0.9 MG/250ML-% IV SOLN
INTRAVENOUS | Status: DC | PRN
  Administered 2023-03-02: 30 ug/min via INTRAVENOUS

## 2023-03-02 MED ORDER — TRANEXAMIC ACID 1000 MG/10ML IV SOLN
INTRAVENOUS | Status: DC | PRN
  Administered 2023-03-02: 2000 mg via TOPICAL

## 2023-03-02 MED ORDER — LIDOCAINE 2% (20 MG/ML) 5 ML SYRINGE
INTRAMUSCULAR | Status: DC | PRN
  Administered 2023-03-02: 60 mg via INTRAVENOUS

## 2023-03-02 MED ORDER — VANCOMYCIN HCL 1000 MG IV SOLR
INTRAVENOUS | Status: AC
  Filled 2023-03-02: qty 20

## 2023-03-02 MED ORDER — METOCLOPRAMIDE HCL 5 MG/ML IJ SOLN: 5.0000 mg | Freq: Three times a day (TID) | INTRAMUSCULAR | Status: DC | PRN

## 2023-03-02 MED ORDER — BUPIVACAINE-MELOXICAM ER 400-12 MG/14ML IJ SOLN
INTRAMUSCULAR | Status: DC | PRN
  Administered 2023-03-02: 400 mg

## 2023-03-02 MED ORDER — SODIUM CHLORIDE 0.9 % IR SOLN
Status: DC | PRN
  Administered 2023-03-02: 3000 mL

## 2023-03-02 MED ORDER — SODIUM CHLORIDE 0.9 % IV SOLN: INTRAVENOUS | Status: DC

## 2023-03-02 MED ORDER — VANCOMYCIN HCL 1 G IV SOLR
INTRAVENOUS | Status: DC | PRN
  Administered 2023-03-02: 1000 mg via TOPICAL

## 2023-03-02 MED ORDER — ONDANSETRON HCL 4 MG/2ML IJ SOLN
4.0000 mg | Freq: Four times a day (QID) | INTRAMUSCULAR | Status: DC | PRN
  Administered 2023-03-02 – 2023-03-03 (×2): 4 mg via INTRAVENOUS
  Filled 2023-03-02 (×2): qty 2

## 2023-03-02 MED ORDER — FENTANYL CITRATE (PF) 250 MCG/5ML IJ SOLN
INTRAMUSCULAR | Status: DC | PRN
  Administered 2023-03-02: 50 ug via INTRAVENOUS

## 2023-03-02 MED ORDER — POVIDONE-IODINE 10 % EX SWAB
2.0000 | Freq: Once | CUTANEOUS | Status: AC
Start: 2023-03-02 — End: 2023-03-02
  Administered 2023-03-02: 2 via TOPICAL

## 2023-03-02 MED ORDER — MAGNESIUM CITRATE PO SOLN: 1.0000 | Freq: Once | ORAL | Status: DC | PRN

## 2023-03-02 MED ORDER — MEPERIDINE HCL 25 MG/ML IJ SOLN: 6.2500 mg | INTRAMUSCULAR | Status: DC | PRN

## 2023-03-02 MED ORDER — PHENYLEPHRINE HCL-NACL 20-0.9 MG/250ML-% IV SOLN
INTRAVENOUS | Status: AC
  Filled 2023-03-02: qty 500

## 2023-03-02 MED ORDER — ASPIRIN 81 MG PO CHEW
81.0000 mg | CHEWABLE_TABLET | Freq: Two times a day (BID) | ORAL | Status: DC
  Administered 2023-03-02: 81 mg via ORAL
  Filled 2023-03-02: qty 1

## 2023-03-02 MED ORDER — PHENOL 1.4 % MT LIQD: 1.0000 | OROMUCOSAL | Status: DC | PRN

## 2023-03-02 MED ORDER — METHOCARBAMOL 500 MG PO TABS
500.0000 mg | ORAL_TABLET | Freq: Four times a day (QID) | ORAL | Status: DC | PRN
  Administered 2023-03-02 – 2023-03-03 (×4): 500 mg via ORAL
  Filled 2023-03-02 (×4): qty 1

## 2023-03-02 MED ORDER — PROPOFOL 500 MG/50ML IV EMUL
INTRAVENOUS | Status: DC | PRN
  Administered 2023-03-02: 100 ug/kg/min via INTRAVENOUS

## 2023-03-02 MED ORDER — METOCLOPRAMIDE HCL 5 MG PO TABS: 5.0000 mg | ORAL_TABLET | Freq: Three times a day (TID) | ORAL | Status: DC | PRN

## 2023-03-02 MED ORDER — ONDANSETRON HCL 4 MG PO TABS
4.0000 mg | ORAL_TABLET | Freq: Four times a day (QID) | ORAL | Status: DC | PRN
  Administered 2023-03-03: 4 mg via ORAL
  Filled 2023-03-02 (×2): qty 1

## 2023-03-02 MED ORDER — PRONTOSAN WOUND IRRIGATION OPTIME
TOPICAL | Status: DC | PRN
  Administered 2023-03-02: 1 via TOPICAL

## 2023-03-02 MED ORDER — KETOROLAC TROMETHAMINE 30 MG/ML IJ SOLN
INTRAMUSCULAR | Status: AC
  Filled 2023-03-02: qty 1

## 2023-03-02 MED ORDER — BUPIVACAINE-MELOXICAM ER 400-12 MG/14ML IJ SOLN
INTRAMUSCULAR | Status: AC
  Filled 2023-03-02: qty 1

## 2023-03-02 MED ORDER — PANTOPRAZOLE SODIUM 40 MG PO TBEC
40.0000 mg | DELAYED_RELEASE_TABLET | Freq: Every day | ORAL | Status: DC
  Administered 2023-03-02: 40 mg via ORAL
  Filled 2023-03-02: qty 1

## 2023-03-02 MED ORDER — LIDOCAINE 2% (20 MG/ML) 5 ML SYRINGE
INTRAMUSCULAR | Status: AC
  Filled 2023-03-02: qty 5

## 2023-03-02 MED ORDER — ROCURONIUM BROMIDE 10 MG/ML (PF) SYRINGE
PREFILLED_SYRINGE | INTRAVENOUS | Status: AC
  Filled 2023-03-02: qty 10

## 2023-03-02 MED ORDER — POLYETHYLENE GLYCOL 3350 17 G PO PACK
17.0000 g | PACK | Freq: Every day | ORAL | Status: DC
  Administered 2023-03-02: 17 g via ORAL
  Filled 2023-03-02: qty 1

## 2023-03-02 MED ORDER — ORAL CARE MOUTH RINSE: 15.0000 mL | Freq: Once | OROMUCOSAL | Status: AC

## 2023-03-02 MED ORDER — FERROUS SULFATE 325 (65 FE) MG PO TABS
325.0000 mg | ORAL_TABLET | Freq: Three times a day (TID) | ORAL | Status: DC
  Administered 2023-03-02 – 2023-03-03 (×3): 325 mg via ORAL
  Filled 2023-03-02 (×3): qty 1

## 2023-03-02 MED ORDER — KETOROLAC TROMETHAMINE 30 MG/ML IJ SOLN
30.0000 mg | Freq: Once | INTRAMUSCULAR | Status: AC | PRN
  Administered 2023-03-02: 30 mg via INTRAVENOUS

## 2023-03-02 MED ORDER — TRANEXAMIC ACID-NACL 1000-0.7 MG/100ML-% IV SOLN
1000.0000 mg | INTRAVENOUS | Status: AC
  Administered 2023-03-02: 1000 mg via INTRAVENOUS
  Filled 2023-03-02: qty 100

## 2023-03-02 MED ORDER — FENTANYL CITRATE (PF) 250 MCG/5ML IJ SOLN
INTRAMUSCULAR | Status: AC
  Filled 2023-03-02: qty 5

## 2023-03-02 MED ORDER — OXYCODONE HCL 5 MG PO TABS
5.0000 mg | ORAL_TABLET | ORAL | Status: DC | PRN
  Administered 2023-03-03: 5 mg via ORAL
  Filled 2023-03-02: qty 1

## 2023-03-02 MED ORDER — MIDAZOLAM HCL 2 MG/2ML IJ SOLN
INTRAMUSCULAR | Status: AC
  Filled 2023-03-02: qty 2

## 2023-03-02 MED ORDER — OXYCODONE HCL ER 10 MG PO T12A
10.0000 mg | EXTENDED_RELEASE_TABLET | Freq: Two times a day (BID) | ORAL | Status: DC
  Administered 2023-03-02: 10 mg via ORAL
  Filled 2023-03-02 (×2): qty 1

## 2023-03-02 MED ORDER — ONDANSETRON HCL 4 MG/2ML IJ SOLN
INTRAMUSCULAR | Status: AC
  Filled 2023-03-02: qty 2

## 2023-03-02 MED ORDER — DIPHENHYDRAMINE HCL 12.5 MG/5ML PO ELIX: 25.0000 mg | ORAL_SOLUTION | ORAL | Status: DC | PRN

## 2023-03-02 MED ORDER — ALUM & MAG HYDROXIDE-SIMETH 200-200-20 MG/5ML PO SUSP: 30.0000 mL | ORAL | Status: DC | PRN

## 2023-03-02 MED ORDER — MUPIROCIN 2 % EX OINT
TOPICAL_OINTMENT | CUTANEOUS | Status: AC
  Filled 2023-03-02: qty 22

## 2023-03-02 MED ORDER — DOCUSATE SODIUM 100 MG PO CAPS
100.0000 mg | ORAL_CAPSULE | Freq: Two times a day (BID) | ORAL | Status: DC
  Administered 2023-03-02 (×2): 100 mg via ORAL
  Filled 2023-03-02 (×2): qty 1

## 2023-03-02 MED ORDER — 0.9 % SODIUM CHLORIDE (POUR BTL) OPTIME
TOPICAL | Status: DC | PRN
  Administered 2023-03-02: 1000 mL

## 2023-03-02 MED ORDER — HYDROMORPHONE HCL 1 MG/ML IJ SOLN
0.2500 mg | INTRAMUSCULAR | Status: DC | PRN
  Administered 2023-03-02 (×3): 0.5 mg via INTRAVENOUS

## 2023-03-02 MED ORDER — METHOCARBAMOL 1000 MG/10ML IJ SOLN: 500.0000 mg | Freq: Four times a day (QID) | INTRAVENOUS | Status: DC | PRN

## 2023-03-02 MED ORDER — PROPOFOL 1000 MG/100ML IV EMUL
INTRAVENOUS | Status: AC
  Filled 2023-03-02: qty 100

## 2023-03-02 SURGICAL SUPPLY — 68 items
BAG COUNTER SPONGE SURGICOUNT (BAG) ×1 IMPLANT
BAG DECANTER FOR FLEXI CONT (MISCELLANEOUS) ×1 IMPLANT
BAG SPNG CNTER NS LX DISP (BAG) ×1
BLADE SAG 18X100X1.27 (BLADE) ×1 IMPLANT
CLSR STERI-STRIP ANTIMIC 1/2X4 (GAUZE/BANDAGES/DRESSINGS) IMPLANT
COOLER ICEMAN CLASSIC (MISCELLANEOUS) IMPLANT
COVER PERINEAL POST (MISCELLANEOUS) ×1 IMPLANT
COVER SURGICAL LIGHT HANDLE (MISCELLANEOUS) ×1 IMPLANT
CUP ACET PINNACLE SECTR 48MM (Joint) IMPLANT
DRAPE C-ARM 42X72 X-RAY (DRAPES) ×1 IMPLANT
DRAPE POUCH INSTRU U-SHP 10X18 (DRAPES) ×1 IMPLANT
DRAPE STERI IOBAN 125X83 (DRAPES) ×1 IMPLANT
DRAPE U-SHAPE 47X51 STRL (DRAPES) ×2 IMPLANT
DRSG AQUACEL AG ADV 3.5X10 (GAUZE/BANDAGES/DRESSINGS) ×1 IMPLANT
DURAPREP 26ML APPLICATOR (WOUND CARE) ×2 IMPLANT
ELECT BLADE 4.0 EZ CLEAN MEGAD (MISCELLANEOUS) ×1
ELECT REM PT RETURN 9FT ADLT (ELECTROSURGICAL) ×1
ELECTRODE BLDE 4.0 EZ CLN MEGD (MISCELLANEOUS) ×1 IMPLANT
ELECTRODE REM PT RTRN 9FT ADLT (ELECTROSURGICAL) ×1 IMPLANT
GLOVE BIOGEL PI IND STRL 7.0 (GLOVE) ×2 IMPLANT
GLOVE BIOGEL PI IND STRL 7.5 (GLOVE) ×5 IMPLANT
GLOVE ECLIPSE 7.0 STRL STRAW (GLOVE) ×2 IMPLANT
GLOVE SKINSENSE STRL SZ7.5 (GLOVE) ×1 IMPLANT
GLOVE SURG SYN 7.5  E (GLOVE) ×2
GLOVE SURG SYN 7.5 E (GLOVE) ×2 IMPLANT
GLOVE SURG SYN 7.5 PF PI (GLOVE) ×2 IMPLANT
GLOVE SURG UNDER POLY LF SZ7 (GLOVE) ×3 IMPLANT
GLOVE SURG UNDER POLY LF SZ7.5 (GLOVE) ×2 IMPLANT
GOWN STRL REUS W/ TWL LRG LVL3 (GOWN DISPOSABLE) IMPLANT
GOWN STRL REUS W/ TWL XL LVL3 (GOWN DISPOSABLE) ×1 IMPLANT
GOWN STRL REUS W/TWL LRG LVL3 (GOWN DISPOSABLE)
GOWN STRL REUS W/TWL XL LVL3 (GOWN DISPOSABLE) ×1
GOWN STRL SURGICAL XL XLNG (GOWN DISPOSABLE) ×1 IMPLANT
GOWN TOGA ZIPPER T7+ PEEL AWAY (MISCELLANEOUS) ×2 IMPLANT
HANDPIECE INTERPULSE COAX TIP (DISPOSABLE) ×1
HEAD FEMORAL 32 CERAMIC (Hips) IMPLANT
HOOD PEEL AWAY T7 (MISCELLANEOUS) ×1 IMPLANT
IV NS IRRIG 3000ML ARTHROMATIC (IV SOLUTION) ×1 IMPLANT
KIT BASIN OR (CUSTOM PROCEDURE TRAY) ×1 IMPLANT
MARKER SKIN DUAL TIP RULER LAB (MISCELLANEOUS) ×1 IMPLANT
NDL SPNL 18GX3.5 QUINCKE PK (NEEDLE) ×1 IMPLANT
NEEDLE SPNL 18GX3.5 QUINCKE PK (NEEDLE) ×1 IMPLANT
PACK TOTAL JOINT (CUSTOM PROCEDURE TRAY) ×1 IMPLANT
PACK UNIVERSAL I (CUSTOM PROCEDURE TRAY) ×1 IMPLANT
PAD COLD SHLDR WRAP-ON (PAD) IMPLANT
PINN ALTRX NEUT ID X OD 32X48 IMPLANT
PINNSECTOR W/GRIP ACE CUP 48MM (Joint) ×1 IMPLANT
SCREW 6.5MMX30MM (Screw) IMPLANT
SET HNDPC FAN SPRY TIP SCT (DISPOSABLE) ×1 IMPLANT
SOLUTION PRONTOSAN WOUND 350ML (IRRIGATION / IRRIGATOR) ×1 IMPLANT
STAPLER VISISTAT 35W (STAPLE) IMPLANT
STEM FEM ACTIS STD SZ4 (Stem) IMPLANT
SUT ETHIBOND 2 V 37 (SUTURE) ×1 IMPLANT
SUT MNCRL AB 3-0 PS2 18 (SUTURE) IMPLANT
SUT MNCRL AB 4-0 PS2 18 (SUTURE) IMPLANT
SUT VIC AB 0 CT1 27 (SUTURE) ×1
SUT VIC AB 0 CT1 27XBRD ANBCTR (SUTURE) ×1 IMPLANT
SUT VIC AB 1 CTX 36 (SUTURE) ×2
SUT VIC AB 1 CTX36XBRD ANBCTR (SUTURE) ×1 IMPLANT
SUT VIC AB 2-0 CT1 27 (SUTURE) ×3
SUT VIC AB 2-0 CT1 TAPERPNT 27 (SUTURE) ×2 IMPLANT
SYR 50ML LL SCALE MARK (SYRINGE) ×1 IMPLANT
TOWEL GREEN STERILE (TOWEL DISPOSABLE) ×1 IMPLANT
TRAY CATH INTERMITTENT SS 16FR (CATHETERS) IMPLANT
TRAY FOLEY W/BAG SLVR 16FR (SET/KITS/TRAYS/PACK) ×1
TRAY FOLEY W/BAG SLVR 16FR ST (SET/KITS/TRAYS/PACK) IMPLANT
TUBE SUCT ARGYLE STRL (TUBING) ×1 IMPLANT
YANKAUER SUCT BULB TIP NO VENT (SUCTIONS) ×1 IMPLANT

## 2023-03-02 NOTE — Anesthesia Postprocedure Evaluation (Signed)
Anesthesia Post Note  Patient: Jill [name]  Procedure(s) Performed: LEFT TOTAL HIP ARTHROPLASTY ANTERIOR APPROACH (Left: Hip)     Anesthesia Type: Spinal Anesthetic complications: no   No notable events documented.  Last Vitals:  Vitals:   03/02/23 0538  BP: 127/73  Pulse: 63  Resp: 16  Temp: 36.7 C    Last Pain:  Vitals:   03/02/23 0615  TempSrc:   PainSc: 0-No pain                 Reggie Pile

## 2023-03-02 NOTE — Op Note (Signed)
LEFT TOTAL HIP ARTHROPLASTY ANTERIOR APPROACH  Procedure Note KAYDIE IVINS   [id number]  Pre-op Diagnosis: left hip osteoarthritis     Post-op Diagnosis: same  Operative Findings Severe DJD   Operative Procedures  1. Total hip replacement; Left hip; uncemented [id number]   Surgeon: Frankey Shown, M.D.  Assist: Madalyn Rob, PA-C   Anesthesia: spinal  Prosthesis: Depuy Acetabulum: Pinnacle 48 mm Femur: Actis 4 STD Head: 32 mm size: +1 Liner: +4 Bearing Type: ceramic/poly  Total Hip Arthroplasty (Anterior Approach) Op Note:  After informed consent was obtained and the operative extremity marked in the holding area, the patient was brought back to the operating room and placed supine on the HANA table. Next, the operative extremity was prepped and draped in normal sterile fashion. Surgical timeout occurred verifying patient identification, surgical site, surgical procedure and administration of antibiotics.  A bikini incision was created.  A Hueter approach to the hip was performed, using the interval between tensor fascia lata and sartorius.  Dissection was carried bluntly down onto the anterior hip capsule. The lateral femoral circumflex vessels were identified and coagulated. A capsulotomy was performed and the capsular flaps tagged for later repair.  The neck osteotomy was performed. The femoral head was removed which showed severe DJD, the acetabular rim was cleared of soft tissue and osteophytes and attention was turned to reaming the acetabulum.  Sequential reaming was performed under fluoroscopic guidance down to the floor of the cotyloid fossa. We reamed to a size 47 mm, and then impacted the acetabular shell. A 30 mm cancellous screw was placed through the shell for added fixation.  The liner was then placed after irrigation and attention turned to the femur.  After placing the femoral hook, the leg was taken to externally rotated, extended and adducted position taking  care to perform soft tissue releases to allow for adequate mobilization of the femur. Soft tissue was cleared from the shoulder of the greater trochanter and the hook elevator used to improve exposure of the proximal femur. Sequential broaching performed up to a size 4 which provided excellent fit. Trial neck and head were placed. The leg was brought back up to neutral and the construct reduced.  Antibiotic irrigation was placed in the surgical wound.  The position and sizing of components, offset and leg lengths were checked using fluoroscopy. Stability of the construct was checked in extension and external rotation without any subluxation, shuck or impingement of prosthesis. We dislocated the prosthesis, dropped the leg back into position, removed trial components, and irrigated copiously. The final stem and head was then placed, the leg brought back up, the system reduced and fluoroscopy used to verify positioning.  We irrigated, obtained hemostasis and closed the capsule using #2 ethibond suture.  One gram of vancomycin powder was placed in the surgical bed.   One gram of topical tranexamic acid was injected into the joint.  The fascia was closed with #1 vicryl plus, the deep fat layer was closed with 0 vicryl, the subcutaneous layers closed with 2.0 Vicryl Plus and the skin closed with 3.0 monocryl and steri strips. A sterile dressing was applied. The patient was awakened in the operating room and taken to recovery in stable condition.  All sponge, needle, and instrument counts were correct at the end of the case.   Tawanna Cooler, my PA, was a medical necessity for opening, closing, limb positioning, retracting, exposing, and overall facilitation and timely completion of the surgery.  Position: supine  Complications: see description of procedure.  Time Out: performed   Drains/Packing: none  Estimated blood loss: see anesthesia record  Returned to Recovery Room: in good condition.    Antibiotics: yes   Mechanical VTE (DVT) Prophylaxis: sequential compression devices, TED thigh-high  Chemical VTE (DVT) Prophylaxis: aspirin   Fluid Replacement: see anesthesia record  Specimens Removed: 1 to pathology   Sponge and Instrument Count Correct? yes   PACU: portable radiograph - low AP   Plan/RTC: Return in 2 weeks for staple removal. Weight Bearing/Load Lower Extremity: full  Hip precautions: none Suture Removal: 2 weeks   N. Eduard Roux, MD [facility] 8:41 AM   Implant Name Type Inv. Item Serial No. Manufacturer Lot No. LRB No. Used Action  PINNSECTOR W/GRIP ACE CUP 48MM - [id number] Joint PINNSECTOR W/GRIP ACE CUP 48MM  [employer] [id number] Left 1 Implanted  PINN ALTRX NEUT ID X OD 32X48 - [id number]  PINN ALTRX NEUT ID X OD 32X48  [employer] [id number] Left 1 Implanted  SCREW 6.5MMX30MM - [id number] Screw SCREW 6.5MMX30MM  [employer] [id number] Left 1 Implanted  HEAD FEMORAL 32 CERAMIC - [id number] Hips HEAD FEMORAL 32 CERAMIC  [employer] [id number] Left 1 Implanted  STEM FEM ACTIS STD SZ4 - [id number] Stem STEM FEM ACTIS STD SZ4  [employer] [id number] Left 1 Implanted

## 2023-03-02 NOTE — Anesthesia Preprocedure Evaluation (Signed)
Anesthesia Evaluation  Patient identified by MRN, date of birth, ID band Patient awake    Reviewed: Allergy & Precautions, NPO status , Patient's Chart, lab work & pertinent test results  History of Anesthesia Complications (+) PONV and history of anesthetic complications  Airway Mallampati: I       Dental no notable dental hx.    Pulmonary    Pulmonary exam normal        Cardiovascular Normal cardiovascular exam     Neuro/Psych    GI/Hepatic   Endo/Other    Renal/GU      Musculoskeletal   Abdominal Normal abdominal exam  (+)   Peds  Hematology negative hematology ROS (+)   Anesthesia Other Findings   Reproductive/Obstetrics                             Anesthesia Physical Anesthesia Plan  ASA: 2  Anesthesia Plan: Spinal   Post-op Pain Management:    Induction:   PONV Risk Score and Plan:   Airway Management Planned: Natural Airway and Simple Face Mask  Additional Equipment: None  Intra-op Plan:   Post-operative Plan:   Informed Consent: I have reviewed the patients History and Physical, chart, labs and discussed the procedure including the risks, benefits and alternatives for the proposed anesthesia with the patient or authorized representative who has indicated his/her understanding and acceptance.       Plan Discussed with: CRNA  Anesthesia Plan Comments:        Anesthesia Quick Evaluation

## 2023-03-02 NOTE — H&P (Signed)
PREOPERATIVE H&P  Chief Complaint: left hip osteoarthritis  HPI: Jill [name] is a 67 y.o. female who presents for surgical treatment of left hip osteoarthritis.  She denies any changes in medical history.  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   PONV (postoperative nausea and vomiting)    "bad when they did my wisdom teeth"   Pre-diabetes    Seasonal allergies    Past Surgical History:  Procedure Laterality Date   ADRENALECTOMY  2018   CERVICAL CERCLAGE  X 2   COLONOSCOPY W/ BIOPSIES AND POLYPECTOMY     TONSILLECTOMY     WISDOM TOOTH EXTRACTION     Social History   Socioeconomic History   Marital status: Married    Spouse name: Not on file   Number of children: Not on file   Years of education: Not on file   Highest education level: Not on file  Occupational History   Not on file  Tobacco Use   Smoking status: Never   Smokeless tobacco: Never  Vaping Use   Vaping Use: Never used  Substance and Sexual Activity   Alcohol use: Yes    Comment: rarely   Drug use: No   Sexual activity: Yes  Other Topics Concern   Not on file  Social History Narrative   Not on file   Social Determinants of Health   Financial Resource Strain: Not on file  Food Insecurity: Not on file  Transportation Needs: Not on file  Physical Activity: Not on file  Stress: Not on file  Social Connections: Not on file   Family History  Problem Relation Age of Onset   Dementia Mother    Diabetes Father    Heart disease Father    Hypertension Father    Cancer Sister        endometrial   Cancer Maternal Aunt        ovarian   Adrenal disorder Neg Hx     Allergies  Allergen Reactions   Anesthetics, Ester Anaphylaxis    Pt pt, mixed with zyrtec made throat close up   Tessalon [Benzonatate] Anaphylaxis    Per pt, this mixed with Zyrtec made her throat "close up"   Zyrtec [Cetirizine] Anaphylaxis    Per pt, this mixed with Tessalon made her throat "close up"   Metrizamide Hives and Nausea Only   Codeine Nausea Only    Dizziness (also) and nausea is SEVERE   Contrast Media [Iodinated Contrast Media] Hives    And nausea   Penicillins Rash    Has patient had a PCN reaction causing immediate rash, facial/tongue/throat swelling, SOB or lightheadedness with hypotension: Yes Has patient had a PCN reaction causing severe rash involving mucus membranes or skin necrosis: No Has patient had a PCN reaction that required hospitalization No Has patient had a PCN reaction occurring within the last 10 years: No If all of the above answers are "NO", then may proceed with Cephalosporin use.    Valium [Diazepam] Nausea Only and Other (See Comments)    Dizziness (also)   Prior  to Admission medications   Medication Sig Start Date End Date Taking? Authorizing Provider  acetaminophen (TYLENOL) 325 MG tablet Take 650 mg by mouth every 6 (six) hours as needed for mild pain.   Yes [provider]  ascorbic acid (VITAMIN C) 500 MG tablet Take 500 mg by mouth daily. Bioflavanoic Compelx 200mg  Rutin 50mg    Yes [provider]  aspirin EC 81 MG tablet Take 1 tablet (81 mg total) by mouth 2 (two) times daily. To be taken after surgery to prevent blood clots 02/18/23 02/18/24  Aundra Dubin, PA-C  Calcium Carbonate Antacid (CALCIUM CARBONATE PO) Take 2,000 mg by mouth daily.   Yes [provider]  cholecalciferol (VITAMIN D) 1000 units tablet Take 2,000 Units by mouth daily.   Yes [provider]  docusate sodium (COLACE) 100 MG capsule Take 1 capsule (100 mg total) by mouth daily as needed. 02/18/23 02/18/24  Aundra Dubin,  PA-C  L-Lysine 1000 MG TABS Take 2,000 mg by mouth daily.   Yes [provider]  methocarbamol (ROBAXIN-750) 750 MG tablet Take 1 tablet (750 mg total) by mouth 2 (two) times daily as needed for muscle spasms. 02/18/23   Aundra Dubin, PA-C  Multiple Vitamin (MULTIVITAMIN WITH MINERALS) TABS tablet Take 1 tablet by mouth daily.   Yes [provider]  Omega-3 Fatty Acids (FISH OIL) 1000 MG CAPS Take 2,000 mg by mouth daily.   Yes [provider]  oxyCODONE-acetaminophen (PERCOCET) 5-325 MG tablet Take 1-2 tablets by mouth every 6 (six) hours as needed. To be taken after surgery 02/18/23   Aundra Dubin, PA-C  promethazine (PHENERGAN) 12.5 MG tablet Take 1 tablet (12.5 mg total) by mouth every 6 (six) hours as needed for nausea or vomiting. 02/25/23   Vanetta Mulders, MD  Tart Cherry 1200 MG CAPS Take 1,200 mg by mouth daily.   Yes [provider]  triamcinolone (NASACORT ALLERGY 24HR) 55 MCG/ACT AERO nasal inhaler Place 2 sprays into the nose daily as needed (allergies).   Yes [provider]  Turmeric 500 MG TABS Take 500 mg by mouth daily.   Yes [provider]     Positive ROS: All other systems have been reviewed and were otherwise negative with the exception of those mentioned in the HPI and as above.  Physical Exam: General: Alert, no acute distress Cardiovascular: No pedal edema Respiratory: No cyanosis, no use of accessory musculature GI: abdomen soft Skin: No lesions in the area of chief complaint Neurologic: Sensation intact distally Psychiatric: Patient is competent for consent with normal mood and affect Lymphatic: no lymphedema  MUSCULOSKELETAL: exam stable  Assessment: left hip osteoarthritis  Plan: Plan for Procedure(s): LEFT TOTAL HIP ARTHROPLASTY ANTERIOR APPROACH  The risks benefits and alternatives were discussed with the patient including but not limited to the risks of nonoperative treatment, versus  surgical intervention including infection, bleeding, nerve injury,  blood clots, cardiopulmonary complications, morbidity, mortality, among others, and they were willing to proceed.   Eduard Roux, MD 03/02/2023 6:04 AM

## 2023-03-02 NOTE — Transfer of Care (Signed)
Immediate Anesthesia Transfer of Care Note  Patient: Jill [name]  Procedure(s) Performed: LEFT TOTAL HIP ARTHROPLASTY ANTERIOR APPROACH (Left: Hip)  Patient Location: PACU  Anesthesia Type:Spinal  Level of Consciousness: awake, alert , and oriented  Airway & Oxygen Therapy: Patient Spontanous Breathing  Post-op Assessment: Report given to RN and Post -op Vital signs reviewed and stable  Post vital signs: Reviewed and stable  Last Vitals:  Vitals Value Taken Time  BP    Temp    Pulse    Resp    SpO2      Last Pain:  Vitals:   03/02/23 0615  TempSrc:   PainSc: 0-No pain         Complications: No notable events documented.

## 2023-03-02 NOTE — Evaluation (Signed)
Physical Therapy Evaluation Patient Details Name: SHERIDA TREVILLIAN MRN: [id number] DOB: 1956/10/12 Today's Date: 03/02/2023  History of Present Illness  67 y.o. female presents to [facility] hospital on 03/02/2023 for elective L THA. PMH includes HLD, PAT, pheochromocytoma, PONV.  Clinical Impression  Pt presents to PT with deficits in strength, power, gait, balance, functional mobility, endurance. Pt is able to transfer and ambulate with support of RW, distance limited by nausea at this time. Pt will benefit from frequent mobilization in an effort to improve strength and to restore independence. PT recommends discharge home when medically appropriate. PT will follow up in the morning for stair training and further ambulation.       Recommendations for follow up therapy are one component of a multi-disciplinary discharge planning process, led by the attending physician.  Recommendations may be updated based on patient status, additional functional criteria and insurance authorization.  Follow Up Recommendations       Assistance Recommended at Discharge PRN  Patient can return home with the following  A little help with bathing/dressing/bathroom;Assistance with cooking/housework;Assist for transportation;Help with stairs or ramp for entrance    Equipment Recommendations Rolling walker (2 wheels)  Recommendations for Other Services       Functional Status Assessment Patient has had a recent decline in their functional status and demonstrates the ability to make significant improvements in function in a reasonable and predictable amount of time.     Precautions / Restrictions Precautions Precautions: Fall Precaution Comments: direct anterior hip Restrictions Weight Bearing Restrictions: Yes LLE Weight Bearing: Weight bearing as tolerated      Mobility  Bed Mobility Overal bed mobility: Needs Assistance Bed Mobility: Supine to Sit, Sit to Supine     Supine to sit: Supervision Sit to supine:  Supervision        Transfers Overall transfer level: Needs assistance Equipment used: Rolling walker (2 wheels) Transfers: Sit to/from Stand Sit to Stand: Supervision           General transfer comment: cues for hand placement    Ambulation/Gait Ambulation/Gait assistance: Supervision Gait Distance (Feet): 40 Feet Assistive device: Rolling walker (2 wheels) Gait Pattern/deviations: Step-through pattern Gait velocity: reduced Gait velocity interpretation: <1.8 ft/sec, indicate of risk for recurrent falls   General Gait Details: slowed step-through gait, distance limited by nausea  Stairs            Wheelchair Mobility    Modified Rankin (Stroke Patients Only)       Balance Overall balance assessment: Needs assistance Sitting-balance support: No upper extremity supported, Feet supported Sitting balance-Leahy Scale: Good     Standing balance support: Single extremity supported, Reliant on assistive device for balance Standing balance-Leahy Scale: Poor                               Pertinent Vitals/Pain Pain Assessment Pain Assessment: 0-10 Pain Score: 3  Pain Location: L hip Pain Descriptors / Indicators: Aching Pain Intervention(s): Monitored during session    Home Living Family/patient expects to be discharged to:: Private residence Living Arrangements: Spouse/significant other;Children;Other relatives Available Help at Discharge: Family;Available 24 hours/day Type of Home: House Home Access: Stairs to enter Entrance Stairs-Rails: None Entrance Stairs-Number of Steps: 3   Home Layout: One level Home Equipment: None      Prior Function Prior Level of Function : Independent/Modified Independent;Driving  Hand Dominance        Extremity/Trunk Assessment   Upper Extremity Assessment Upper Extremity Assessment: Overall WFL for tasks assessed    Lower Extremity Assessment Lower Extremity Assessment:  LLE deficits/detail LLE Deficits / Details: generalized post-op weakness, 4/5 DF/PF, 4-/5 knee flexion/extension, hip grossly 3+/5    Cervical / Trunk Assessment Cervical / Trunk Assessment: Normal  Communication   Communication: No difficulties  Cognition Arousal/Alertness: Awake/alert Behavior During Therapy: WFL for tasks assessed/performed Overall Cognitive Status: Within Functional Limits for tasks assessed                                          General Comments General comments (skin integrity, edema, etc.): VSS on RA, pt reports nausea throughout mobility    Exercises Other Exercises Other Exercises: PT provides education and demonstration on total hip exercise packet   Assessment/Plan    PT Assessment Patient needs continued PT services  PT Problem List Decreased strength;Decreased activity tolerance;Decreased balance;Decreased mobility;Pain       PT Treatment Interventions DME instruction;Gait training;Stair training;Functional mobility training;Therapeutic activities;Therapeutic exercise;Balance training;Neuromuscular re-education;Patient/family education    PT Goals (Current goals can be found in the Care Plan section)  Acute Rehab PT Goals Patient Stated Goal: to return home PT Goal Formulation: With patient Time For Goal Achievement: 03/06/23 Potential to Achieve Goals: Good    Frequency 7X/week     Co-evaluation               AM-PAC PT "6 Clicks" Mobility  Outcome Measure Help needed turning from your back to your side while in a flat bed without using bedrails?: A Little Help needed moving from lying on your back to sitting on the side of a flat bed without using bedrails?: A Little Help needed moving to and from a bed to a chair (including a wheelchair)?: A Little Help needed standing up from a chair using your arms (e.g., wheelchair or bedside chair)?: A Little Help needed to walk in hospital room?: A Little Help needed  climbing 3-5 steps with a railing? : A Lot 6 Click Score: 17    End of Session   Activity Tolerance: Patient tolerated treatment well Patient left: in bed;with call bell/phone within reach;with family/visitor present Nurse Communication: Mobility status PT Visit Diagnosis: Other abnormalities of gait and mobility (R26.89);Muscle weakness (generalized) (M62.81);Pain Pain - Right/Left: Left Pain - part of body: Hip    Time: 1349-1415 PT Time Calculation (min) (ACUTE ONLY): 26 min   Charges:   PT Evaluation $PT Eval Low Complexity: 1 Low          Zenaida Niece, PT, DPT Acute Rehabilitation Office [phone]   Zenaida Niece 03/02/2023, 3:09 PM

## 2023-03-02 NOTE — Anesthesia Procedure Notes (Signed)
Spinal  Patient location during procedure: OR Start time: 03/02/2023 7:17 AM End time: 03/02/2023 7:19 AM Reason for block: surgical anesthesia Staffing Performed: anesthesiologist  Anesthesiologist: Lyn Hollingshead, MD Performed by: Lyn Hollingshead, MD Authorized by: Lyn Hollingshead, MD   Preanesthetic Checklist Completed: patient identified, IV checked, site marked, risks and benefits discussed, surgical consent, monitors and equipment checked, pre-op evaluation and timeout performed Spinal Block Patient position: sitting Prep: DuraPrep and site prepped and draped Patient monitoring: continuous pulse ox and blood pressure Approach: midline Location: L3-4 Injection technique: single-shot Needle Needle type: Pencan  Needle gauge: 24 G Needle length: 10 cm Needle insertion depth: 5 cm Assessment Sensory level: T8 Events: CSF return

## 2023-03-02 NOTE — Discharge Instructions (Signed)

## 2023-03-03 DIAGNOSIS — Z96642 Presence of left artificial hip joint: Secondary | ICD-10-CM | POA: Diagnosis not present

## 2023-03-03 DIAGNOSIS — M1612 Unilateral primary osteoarthritis, left hip: Secondary | ICD-10-CM | POA: Diagnosis not present

## 2023-03-03 DIAGNOSIS — M545 Low back pain, unspecified: Secondary | ICD-10-CM | POA: Diagnosis not present

## 2023-03-03 DIAGNOSIS — M9904 Segmental and somatic dysfunction of sacral region: Secondary | ICD-10-CM | POA: Diagnosis not present

## 2023-03-03 LAB — CBC
HCT: 32.2 % — ABNORMAL LOW (ref 36.0–46.0)
Hemoglobin: 10.5 g/dL — ABNORMAL LOW (ref 12.0–15.0)
MCH: 27.4 pg (ref 26.0–34.0)
MCHC: 32.6 g/dL (ref 30.0–36.0)
MCV: 84.1 fL (ref 80.0–100.0)
Platelets: 173 10*3/uL (ref 150–400)
RBC: 3.83 MIL/uL — ABNORMAL LOW (ref 3.87–5.11)
RDW: 13.6 % (ref 11.5–15.5)
WBC: 13.6 10*3/uL — ABNORMAL HIGH (ref 4.0–10.5)
nRBC: 0 % (ref 0.0–0.2)

## 2023-03-03 NOTE — Discharge Summary (Signed)
Patient ID: Jill [name] MRN: [id number] DOB/AGE: Sep 16, 1956 67 y.o.  Admit date: 03/02/2023 Discharge date: 03/03/2023  Admission Diagnoses:  Principal Problem:   Primary osteoarthritis of left hip Active Problems:   Status post total replacement of left hip   Discharge Diagnoses:  Same  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   PONV (postoperative nausea and vomiting)    "bad when they did my wisdom teeth"   Pre-diabetes    Seasonal allergies     Surgeries: Procedure(s): LEFT TOTAL HIP ARTHROPLASTY ANTERIOR APPROACH on 03/02/2023   Consultants:   Discharged Condition: Improved  Hospital Course: Jill [name] is an 67 y.o. female who was admitted 03/02/2023 for operative treatment ofPrimary osteoarthritis of left hip. Patient has severe unremitting pain that affects sleep, daily activities, and work/hobbies. After pre-op clearance the patient was taken to the operating room on 03/02/2023 and underwent  Procedure(s): LEFT TOTAL HIP ARTHROPLASTY ANTERIOR APPROACH.    Patient was given perioperative antibiotics:  Anti-infectives (From admission, onward)    Start     Dose/Rate Route Frequency Ordered Stop   03/02/23 1045  ceFAZolin (ANCEF) IVPB 2g/100 mL premix        2 g 200 mL/hr over 30 Minutes Intravenous Every 6 hours 03/02/23 1037 03/02/23 1802   03/02/23 0755  vancomycin (VANCOCIN) powder  Status:  Discontinued          As needed 03/02/23 0755 03/02/23 0913   03/02/23 0600  ceFAZolin (ANCEF) IVPB 2g/100 mL premix        2 g 200 mL/hr over 30 Minutes Intravenous On call to O.R. 03/02/23 0559 03/02/23 0734        Patient was given sequential  compression devices, early ambulation, and chemoprophylaxis to prevent DVT.  Patient benefited maximally from hospital stay and there were no complications.    Recent vital signs: Patient Vitals for the past 24 hrs:  BP Temp Temp src Pulse Resp SpO2  03/03/23 0335 (!) 122/53 99.1 F (37.3 C) Oral 72 18 98 %  03/02/23 2301 (!) 103/57 99.1 F (37.3 C) Oral 74 18 97 %  03/02/23 2000 (!) 111/55 99.2 F (37.3 C) Oral 73 18 97 %  03/02/23 1640 (!) 112/53 98.2 F (36.8 C) Oral 69 20 98 %  03/02/23 1057 (!) 119/59 97.7 F (36.5 C) Oral 67 18 99 %  03/02/23 1030 100/77 -- -- 77 18 96 %  03/02/23 1015 95/60 -- -- 70 11 95 %  03/02/23 1000 (!) 98/53 -- -- 66 16 99 %  03/02/23 0945 (!) 95/54 -- -- 63 15 98 %  03/02/23 0930 113/65 -- -- 63 15 97 %  03/02/23 0916 105/70 97.6 F (36.4 C) -- 68 16 98 %     Recent laboratory studies:  Recent Labs    03/03/23 0634  WBC 13.6*  HGB 10.5*  HCT 32.2*  PLT 173     Discharge Medications:   Allergies as of 03/03/2023       Reactions   Anesthetics, Ester Anaphylaxis   Pt pt, mixed with zyrtec made  throat close up   Tessalon [benzonatate] Anaphylaxis   Per pt, this mixed with Zyrtec made her throat "close up"   Zyrtec [cetirizine] Anaphylaxis   Per pt, this mixed with Tessalon made her throat "close up"   Metrizamide Hives, Nausea Only   Codeine Nausea Only   Dizziness (also) and nausea is SEVERE   Contrast Media [iodinated Contrast Media] Hives   And nausea   Penicillins Rash   Has patient had a PCN reaction causing immediate rash, facial/tongue/throat swelling, SOB or lightheadedness with hypotension: Yes Has patient had a PCN reaction causing severe rash involving mucus membranes or skin necrosis: No Has patient had a PCN reaction that required hospitalization No Has patient had a PCN reaction occurring within the last 10 years: No If all of the above answers are "NO", then may proceed with Cephalosporin use.   Valium [diazepam] Nausea  Only, Other (See Comments)   Dizziness (also)        Medication List     STOP taking these medications    acetaminophen 325 MG tablet Commonly known as: TYLENOL   Fish Oil 1000 MG Caps   Turmeric 500 MG Tabs       TAKE these medications    ascorbic acid 500 MG tablet Commonly known as: VITAMIN C Take 500 mg by mouth daily. Bioflavanoic Compelx 200mg  Rutin 50mg    aspirin EC 81 MG tablet Take 1 tablet (81 mg total) by mouth 2 (two) times daily. To be taken after surgery to prevent blood clots   CALCIUM CARBONATE PO Take 2,000 mg by mouth daily.   cholecalciferol 1000 units tablet Commonly known as: VITAMIN D Take 2,000 Units by mouth daily.   docusate sodium 100 MG capsule Commonly known as: Colace Take 1 capsule (100 mg total) by mouth daily as needed.   L-Lysine 1000 MG Tabs Take 2,000 mg by mouth daily.   methocarbamol 750 MG tablet Commonly known as: Robaxin-750 Take 1 tablet (750 mg total) by mouth 2 (two) times daily as needed for muscle spasms.   multivitamin with minerals Tabs tablet Take 1 tablet by mouth daily.   Nasacort Allergy 24HR 55 MCG/ACT Aero nasal inhaler Generic drug: triamcinolone Place 2 sprays into the nose daily as needed (allergies).   oxyCODONE-acetaminophen 5-325 MG tablet Commonly known as: Percocet Take 1-2 tablets by mouth every 6 (six) hours as needed. To be taken after surgery   promethazine 12.5 MG tablet Commonly known as: PHENERGAN Take 1 tablet (12.5 mg total) by mouth every 6 (six) hours as needed for nausea or vomiting.   Tart Cherry 1200 MG Caps Take 1,200 mg by mouth daily.               Durable Medical Equipment  (From admission, onward)           Start     Ordered   03/02/23 1101  DME Walker rolling  Once       Question:  Patient needs a walker to treat with the following condition  Answer:  History of hip replacement   03/02/23 1100   03/02/23 1101  DME 3 n 1  Once        03/02/23 1100    03/02/23 1101  DME Bedside commode  Once       Question:  Patient needs a bedside commode to treat with the following condition  Answer:  History of hip replacement   03/02/23 1100  Diagnostic Studies: DG Pelvis Portable  Result Date: 03/02/2023 CLINICAL DATA:  [id number] Hip joint replacement status [id number] EXAM: PORTABLE PELVIS 1-2 VIEWS COMPARISON:  01/16/2023 FINDINGS: Postsurgical changes of left total hip arthroplasty. Alignment is normal. There is no evidence of immediate complication. Expected soft tissue changes. IMPRESSION: Postsurgical changes of left total hip arthroplasty. No evidence of immediate hardware complication. Electronically Signed   By: Maurine Simmering M.D.   On: 03/02/2023 10:22   DG HIP UNILAT WITH PELVIS 1V LEFT  Result Date: 03/02/2023 CLINICAL DATA:  Fluoro guidance provided EXAM: DG HIP (WITH OR WITHOUT PELVIS) 1V*L* FINDINGS: Dose: 1.1 mGy Fluoro time 23s IMPRESSION: C-arm fluoro guidance provided. Electronically Signed   By: Sammie Bench M.D.   On: 03/02/2023 08:43   DG C-Arm 1-60 Min-No Report  Result Date: 03/02/2023 Fluoroscopy was utilized by the requesting physician.  No radiographic interpretation.   DG C-Arm 1-60 Min-No Report  Result Date: 03/02/2023 Fluoroscopy was utilized by the requesting physician.  No radiographic interpretation.    Disposition: Discharge disposition: 01-Home or [place], [place] Follow up.   Specialty: [place] Why: The home health agency will contact you for the first home visit Contact information: [address] [place] [address] [phone]         Leandrew Koyanagi, MD. Schedule an appointment as soon as possible for a visit in 2 week(s).   Specialty: Orthopedic Surgery Contact information: [address] [place] [address] [phone]                  Signed: Aundra Dubin 03/03/2023, 8:08 AM

## 2023-03-03 NOTE — Progress Notes (Signed)
Subjective: 1 Day Post-Op Procedure(s) (LRB): LEFT TOTAL HIP ARTHROPLASTY ANTERIOR APPROACH (Left) Patient reports pain as moderate.    Objective: Vital signs in last 24 hours: Temp:  [97.6 F (36.4 C)-99.2 F (37.3 C)] 99.1 F (37.3 C) (04/02 0335) Pulse Rate:  [63-77] 72 (04/02 0335) Resp:  [11-20] 18 (04/02 0335) BP: (95-122)/(53-77) 122/53 (04/02 0335) SpO2:  [95 %-99 %] 98 % (04/02 0335)  Intake/Output from previous day: 04/01 0701 - 04/02 0700 In: 1880 [P.O.:480; I.V.:1400] Out: 850 [Urine:700; Blood:150] Intake/Output this shift: No intake/output data recorded.  Recent Labs    03/03/23 0634  HGB 10.5*   Recent Labs    03/03/23 0634  WBC 13.6*  RBC 3.83*  HCT 32.2*  PLT 173   No results for input(s): "NA", "K", "CL", "CO2", "BUN", "CREATININE", "GLUCOSE", "CALCIUM" in the last 72 hours. No results for input(s): "LABPT", "INR" in the last 72 hours.  Neurologically intact Neurovascular intact Sensation intact distally Intact pulses distally Dorsiflexion/Plantar flexion intact Incision: moderate drainage No cellulitis present Compartment soft   Assessment/Plan: 1 Day Post-Op Procedure(s) (LRB): LEFT TOTAL HIP ARTHROPLASTY ANTERIOR APPROACH (Left) Advance diet Up with therapy D/C IV fluids Discharge home with home health once urinated and is cleared by PT WBAT LLE Nurse to change bandage prior to d/c      Aundra Dubin 03/03/2023, 8:07 AM

## 2023-03-03 NOTE — Progress Notes (Signed)
Patient alert and oriented, patient ambulate, void. Surgical site with small drainage extra dressing given. D/c instructions explain and given all questions answered. Pt. D/c home per order.

## 2023-03-03 NOTE — Progress Notes (Signed)
Physical Therapy Treatment Patient Details Name: Jill [name] MRN: [id number] DOB: January 08, 1956 Today's Date: 03/03/2023   History of Present Illness 67 y.o. female presents to [facility] hospital on 03/02/2023 for elective L THA. PMH includes HLD, PAT, pheochromocytoma, PONV.    PT Comments    Pt greeted supine in bed and agreeable to session with good progress towards acute goals. Pt requiring grossly min guard assist for functional transfers, gait and stair negotiation and up to min A to manage LLE during bed mobility. Pt provided with gait belt to use as leg lifter with pt demonstrating sit>supine with lifter with improvement. Pt able to ascend/descend 3 steps in stairwell x2 trials with instruction provided and cues throughout for safety, pt completing once with handrail and a second time with pt spouse providing HHA as pt reporting no rail at home demonstrating safe technique both times.  Pt was educated on continued walker use to maximize functional independence, safety, and decrease risk for falls, as well as ice use, safe car entry/exit and activity recommendations, with pt verbalizing understanding. Anticipate safe discharge, with assist level outlined below, once medically cleared, will continue to follow acutely.    Recommendations for follow up therapy are one component of a multi-disciplinary discharge planning process, led by the attending physician.  Recommendations may be updated based on patient status, additional functional criteria and insurance authorization.  Follow Up Recommendations       Assistance Recommended at Discharge PRN  Patient can return home with the following A little help with bathing/dressing/bathroom;Assistance with cooking/housework;Assist for transportation;Help with stairs or ramp for entrance   Equipment Recommendations  Rolling walker (2 wheels)    Recommendations for Other Services       Precautions / Restrictions Precautions Precautions: Fall Precaution  Comments: direct anterior hip Restrictions Weight Bearing Restrictions: Yes LLE Weight Bearing: Weight bearing as tolerated     Mobility  Bed Mobility Overal bed mobility: Needs Assistance Bed Mobility: Supine to Sit, Sit to Supine     Supine to sit: Supervision Sit to supine: Min assist   General bed mobility comments: assist to return LLE to bed, pt provided with gait belt to use as leg lifter    Transfers Overall transfer level: Needs assistance Equipment used: Rolling walker (2 wheels) Transfers: Sit to/from Stand Sit to Stand: Supervision           General transfer comment: pt demonstrating good hand placement    Ambulation/Gait Ambulation/Gait assistance: Supervision Gait Distance (Feet): 280 Feet Assistive device: Rolling walker (2 wheels) Gait Pattern/deviations: Step-through pattern Gait velocity: slightly reduced     General Gait Details: step through gait with RW, cues for RW proximity as pt with tendecy to step too close to front of RW   Stairs Stairs: Yes Stairs assistance: Min guard Stair Management: No rails, One rail Right, Step to pattern, Forwards Number of Stairs: 3 (x 2) General stair comments: instructed pt on stair negotiation and sequencing with pt able to demonstrate safetly with min guard with rail use, pt without handrail at home, instructed pt spouse on safe guarding technique and assist with pt and pt spouse able to demonstrate   Wheelchair Mobility    Modified Rankin (Stroke Patients Only)       Balance Overall balance assessment: Needs assistance Sitting-balance support: No upper extremity supported, Feet supported Sitting balance-Leahy Scale: Good     Standing balance support: Single extremity supported, Reliant on assistive device for balance Standing balance-Leahy Scale: Poor Standing balance comment:  reliant on UE support during dynamic activity                            Cognition Arousal/Alertness:  Awake/alert Behavior During Therapy: WFL for tasks assessed/performed Overall Cognitive Status: Within Functional Limits for tasks assessed                                          Exercises Other Exercises Other Exercises: reviwed HEP exercises with pt verbalizing understanding    General Comments General comments (skin integrity, edema, etc.): VSS on RA, some nausea at end of session with increased activity      Pertinent Vitals/Pain Pain Assessment Pain Assessment: Faces Faces Pain Scale: Hurts a little bit Pain Location: L hip Pain Descriptors / Indicators: Aching, Sore Pain Intervention(s): Monitored during session, Limited activity within patient's tolerance    Home Living                          Prior Function            PT Goals (current goals can now be found in the care plan section) Acute Rehab PT Goals Patient Stated Goal: to return home PT Goal Formulation: With patient Time For Goal Achievement: 03/06/23 Progress towards PT goals: Progressing toward goals    Frequency    7X/week      PT Plan      Co-evaluation              AM-PAC PT "6 Clicks" Mobility   Outcome Measure  Help needed turning from your back to your side while in a flat bed without using bedrails?: A Little Help needed moving from lying on your back to sitting on the side of a flat bed without using bedrails?: A Little Help needed moving to and from a bed to a chair (including a wheelchair)?: A Little Help needed standing up from a chair using your arms (e.g., wheelchair or bedside chair)?: A Little Help needed to walk in hospital room?: A Little Help needed climbing 3-5 steps with a railing? : A Little 6 Click Score: 18    End of Session   Activity Tolerance: Patient tolerated treatment well Patient left: in bed;with call bell/phone within reach;with family/visitor present Nurse Communication: Mobility status (pt without need for PM  session) PT Visit Diagnosis: Other abnormalities of gait and mobility (R26.89);Muscle weakness (generalized) (M62.81);Pain Pain - Right/Left: Left Pain - part of body: Hip     Time: [id number] PT Time Calculation (min) (ACUTE ONLY): 25 min  Charges:  $Gait Training: 23-37 mins                     Estaban Mainville R. PTA Acute Rehabilitation Services Office: [place] 03/03/2023, 11:15 AM

## 2023-03-04 ENCOUNTER — Telehealth: Payer: Self-pay | Admitting: *Deleted

## 2023-03-04 DIAGNOSIS — J302 Other seasonal allergic rhinitis: Secondary | ICD-10-CM | POA: Diagnosis not present

## 2023-03-04 DIAGNOSIS — Z471 Aftercare following joint replacement surgery: Secondary | ICD-10-CM | POA: Diagnosis not present

## 2023-03-04 DIAGNOSIS — M1990 Unspecified osteoarthritis, unspecified site: Secondary | ICD-10-CM | POA: Diagnosis not present

## 2023-03-04 DIAGNOSIS — Z96642 Presence of left artificial hip joint: Secondary | ICD-10-CM | POA: Diagnosis not present

## 2023-03-04 DIAGNOSIS — Z8711 Personal history of peptic ulcer disease: Secondary | ICD-10-CM | POA: Diagnosis not present

## 2023-03-04 DIAGNOSIS — K635 Polyp of colon: Secondary | ICD-10-CM | POA: Diagnosis not present

## 2023-03-04 DIAGNOSIS — Z7982 Long term (current) use of aspirin: Secondary | ICD-10-CM | POA: Diagnosis not present

## 2023-03-04 DIAGNOSIS — E7800 Pure hypercholesterolemia, unspecified: Secondary | ICD-10-CM | POA: Diagnosis not present

## 2023-03-04 DIAGNOSIS — R7303 Prediabetes: Secondary | ICD-10-CM | POA: Diagnosis not present

## 2023-03-04 DIAGNOSIS — K862 Cyst of pancreas: Secondary | ICD-10-CM | POA: Diagnosis not present

## 2023-03-04 NOTE — Telephone Encounter (Signed)
Ortho bundle D/C call completed. 

## 2023-03-09 DIAGNOSIS — Z8711 Personal history of peptic ulcer disease: Secondary | ICD-10-CM | POA: Diagnosis not present

## 2023-03-09 DIAGNOSIS — Z96642 Presence of left artificial hip joint: Secondary | ICD-10-CM | POA: Diagnosis not present

## 2023-03-09 DIAGNOSIS — K862 Cyst of pancreas: Secondary | ICD-10-CM | POA: Diagnosis not present

## 2023-03-09 DIAGNOSIS — Z7982 Long term (current) use of aspirin: Secondary | ICD-10-CM | POA: Diagnosis not present

## 2023-03-09 DIAGNOSIS — M1990 Unspecified osteoarthritis, unspecified site: Secondary | ICD-10-CM | POA: Diagnosis not present

## 2023-03-09 DIAGNOSIS — K635 Polyp of colon: Secondary | ICD-10-CM | POA: Diagnosis not present

## 2023-03-09 DIAGNOSIS — J302 Other seasonal allergic rhinitis: Secondary | ICD-10-CM | POA: Diagnosis not present

## 2023-03-09 DIAGNOSIS — E7800 Pure hypercholesterolemia, unspecified: Secondary | ICD-10-CM | POA: Diagnosis not present

## 2023-03-09 DIAGNOSIS — R7303 Prediabetes: Secondary | ICD-10-CM | POA: Diagnosis not present

## 2023-03-09 DIAGNOSIS — Z471 Aftercare following joint replacement surgery: Secondary | ICD-10-CM | POA: Diagnosis not present

## 2023-03-12 ENCOUNTER — Telehealth: Payer: Self-pay | Admitting: *Deleted

## 2023-03-12 NOTE — Telephone Encounter (Signed)
Attempted call to patient to check status; left VM requesting call back.

## 2023-03-16 DIAGNOSIS — E7800 Pure hypercholesterolemia, unspecified: Secondary | ICD-10-CM | POA: Diagnosis not present

## 2023-03-16 DIAGNOSIS — Z96642 Presence of left artificial hip joint: Secondary | ICD-10-CM | POA: Diagnosis not present

## 2023-03-16 DIAGNOSIS — R7303 Prediabetes: Secondary | ICD-10-CM | POA: Diagnosis not present

## 2023-03-16 DIAGNOSIS — Z8711 Personal history of peptic ulcer disease: Secondary | ICD-10-CM | POA: Diagnosis not present

## 2023-03-16 DIAGNOSIS — M1990 Unspecified osteoarthritis, unspecified site: Secondary | ICD-10-CM | POA: Diagnosis not present

## 2023-03-16 DIAGNOSIS — Z471 Aftercare following joint replacement surgery: Secondary | ICD-10-CM | POA: Diagnosis not present

## 2023-03-16 DIAGNOSIS — Z7982 Long term (current) use of aspirin: Secondary | ICD-10-CM | POA: Diagnosis not present

## 2023-03-16 DIAGNOSIS — K862 Cyst of pancreas: Secondary | ICD-10-CM | POA: Diagnosis not present

## 2023-03-16 DIAGNOSIS — K635 Polyp of colon: Secondary | ICD-10-CM | POA: Diagnosis not present

## 2023-03-16 DIAGNOSIS — J302 Other seasonal allergic rhinitis: Secondary | ICD-10-CM | POA: Diagnosis not present

## 2023-03-17 ENCOUNTER — Telehealth: Payer: Self-pay | Admitting: *Deleted

## 2023-03-17 ENCOUNTER — Ambulatory Visit (INDEPENDENT_AMBULATORY_CARE_PROVIDER_SITE_OTHER): Payer: PPO | Admitting: Physician Assistant

## 2023-03-17 DIAGNOSIS — Z96642 Presence of left artificial hip joint: Secondary | ICD-10-CM

## 2023-03-17 NOTE — Progress Notes (Signed)
Post-Op Visit Note   Patient: Jill [name]           Date of Birth: [birth year]           MRN: [id number] Visit Date: 03/17/2023 PCP: Donita Brooks, MD   Assessment & Plan:  Chief Complaint:  Chief Complaint  Patient presents with   Left Hip - Routine Post Op   Visit Diagnoses:  1. Status post total replacement of left hip     Plan: Patient is a pleasant 67 year old female who comes in today 2 weeks status post left total hip replacement 03/02/2023.  She has been doing well.  She has been taking Tylenol extra strength for pain.  She has been getting home health physical therapy and is ambulating with a single-point cane.  She was taking a baby aspirin twice daily for DVT prophylaxis until last Thursday when she noticed a large bruise to her hand.  She stopped taking her aspirin at that point.  Examination of the left hip reveals a well-healed surgical incision without evidence of infection or cellulitis.  Calves are soft nontender.  She is neurovascularly intact distally.  At this point, I recommended that she continue taking her baby aspirin for DVT prophylaxis for another 4 weeks.  We reapplied new Steri-Strips.  She will continue to advance activity as tolerated and follow-up with [place] in 4 weeks for repeat evaluation and AP pelvis x-rays.  Dental prophylaxis reinforced.  Call with concerns or questions.  Follow-Up Instructions: Return in about 4 weeks (around 04/14/2023).   Orders:  No orders of the defined types were placed in this encounter.  No orders of the defined types were placed in this encounter.   Imaging: No new imaging  PMFS History: Patient Active Problem List   Diagnosis Date Noted   Status post total replacement of left hip 03/02/2023   Primary osteoarthritis of left hip 11/10/2022   Low back pain 04/10/2022   Somatic dysfunction of spine, sacral 04/10/2022   Adrenal nodule 03/31/2017   Pancreatic cyst 03/31/2017   Chest pain 03/31/2017   Prediabetes  03/31/2017   Abdominal pain    Pancreatitis 03/27/2017   Hypertensive urgency 03/24/2017   Sinusitis 03/24/2017   Leukocytosis 03/24/2017   Headache 03/24/2017   Hyperglycemia 03/24/2017   Atrial tachycardia, paroxysmal 03/24/2017   HLD (hyperlipidemia) 10/29/2016   Past Medical History:  Diagnosis Date   Arthritis    "hands" (03/24/2017)   Colon polyp    High cholesterol    Multifocal atrial tachycardia    Pancreatic cyst    PAT (paroxysmal atrial tachycardia)    Hattie Perch 03/24/2017- d/t pheochromocytoma per patient   Peptic ulcer    "when I was a child"   Pheochromocytoma    removed in 2018- had HTN and tachycardia due to this   PONV (postoperative nausea and vomiting)    "bad when they did my wisdom teeth"   Pre-diabetes    Seasonal allergies     Family History  Problem Relation Age of Onset   Dementia Mother    Diabetes Father    Heart disease Father    Hypertension Father    Cancer Sister        endometrial   Cancer Maternal Aunt        ovarian   Adrenal disorder Neg Hx     Past Surgical History:  Procedure Laterality Date   ADRENALECTOMY  2018   CERVICAL CERCLAGE  X 2   COLONOSCOPY W/ BIOPSIES  AND POLYPECTOMY     TONSILLECTOMY     TOTAL HIP ARTHROPLASTY Left 03/02/2023   Procedure: LEFT TOTAL HIP ARTHROPLASTY ANTERIOR APPROACH;  Surgeon: Tarry Kos, MD;  Location: [facility] OR;  Service: Orthopedics;  Laterality: Left;  3-C   WISDOM TOOTH EXTRACTION     Social History   Occupational History   Not on file  Tobacco Use   Smoking status: Never   Smokeless tobacco: Never  Vaping Use   Vaping Use: Never used  Substance and Sexual Activity   Alcohol use: Yes    Comment: rarely   Drug use: No   Sexual activity: Yes

## 2023-03-17 NOTE — Telephone Encounter (Signed)
Ortho bundle 14 day in office appointment completed. 

## 2023-03-18 DIAGNOSIS — Z8711 Personal history of peptic ulcer disease: Secondary | ICD-10-CM | POA: Diagnosis not present

## 2023-03-18 DIAGNOSIS — K635 Polyp of colon: Secondary | ICD-10-CM | POA: Diagnosis not present

## 2023-03-18 DIAGNOSIS — Z96642 Presence of left artificial hip joint: Secondary | ICD-10-CM | POA: Diagnosis not present

## 2023-03-18 DIAGNOSIS — R7303 Prediabetes: Secondary | ICD-10-CM | POA: Diagnosis not present

## 2023-03-18 DIAGNOSIS — Z7982 Long term (current) use of aspirin: Secondary | ICD-10-CM | POA: Diagnosis not present

## 2023-03-18 DIAGNOSIS — Z471 Aftercare following joint replacement surgery: Secondary | ICD-10-CM | POA: Diagnosis not present

## 2023-03-18 DIAGNOSIS — K862 Cyst of pancreas: Secondary | ICD-10-CM | POA: Diagnosis not present

## 2023-03-18 DIAGNOSIS — M1990 Unspecified osteoarthritis, unspecified site: Secondary | ICD-10-CM | POA: Diagnosis not present

## 2023-03-18 DIAGNOSIS — E7800 Pure hypercholesterolemia, unspecified: Secondary | ICD-10-CM | POA: Diagnosis not present

## 2023-03-18 DIAGNOSIS — J302 Other seasonal allergic rhinitis: Secondary | ICD-10-CM | POA: Diagnosis not present

## 2023-03-23 ENCOUNTER — Other Ambulatory Visit: Payer: Self-pay | Admitting: Physician Assistant

## 2023-03-23 NOTE — Telephone Encounter (Signed)
Ladona Ridgel do you mind sending her the pic of the femoral head.  Thank you.

## 2023-03-31 ENCOUNTER — Other Ambulatory Visit: Payer: Self-pay | Admitting: General Surgery

## 2023-03-31 ENCOUNTER — Encounter: Payer: Self-pay | Admitting: Family Medicine

## 2023-03-31 DIAGNOSIS — K862 Cyst of pancreas: Secondary | ICD-10-CM

## 2023-04-02 DIAGNOSIS — K862 Cyst of pancreas: Secondary | ICD-10-CM | POA: Diagnosis not present

## 2023-04-02 LAB — COMPREHENSIVE METABOLIC PANEL (CC13): EGFR: 90

## 2023-04-02 LAB — HEMOGLOBIN A1C: A1c: 5.8

## 2023-04-03 ENCOUNTER — Encounter: Payer: Self-pay | Admitting: Family Medicine

## 2023-04-10 ENCOUNTER — Ambulatory Visit
Admission: RE | Admit: 2023-04-10 | Discharge: 2023-04-10 | Disposition: A | Discharge status: Home / Self Care | Payer: PPO | Source: Ambulatory Visit | Attending: General Surgery | Admitting: General Surgery

## 2023-04-10 DIAGNOSIS — K862 Cyst of pancreas: Secondary | ICD-10-CM

## 2023-04-10 MED ORDER — GADOPICLENOL 0.5 MMOL/ML IV SOLN
7.0000 mL | Freq: Once | INTRAVENOUS | Status: AC | PRN
  Administered 2023-04-10: 7 mL via INTRAVENOUS

## 2023-04-13 DIAGNOSIS — K862 Cyst of pancreas: Secondary | ICD-10-CM | POA: Diagnosis not present

## 2023-04-14 ENCOUNTER — Ambulatory Visit (INDEPENDENT_AMBULATORY_CARE_PROVIDER_SITE_OTHER): Payer: PPO | Admitting: Orthopaedic Surgery

## 2023-04-14 ENCOUNTER — Telehealth: Payer: Self-pay | Admitting: *Deleted

## 2023-04-14 ENCOUNTER — Other Ambulatory Visit (INDEPENDENT_AMBULATORY_CARE_PROVIDER_SITE_OTHER): Payer: PPO

## 2023-04-14 DIAGNOSIS — Z96642 Presence of left artificial hip joint: Secondary | ICD-10-CM

## 2023-04-14 NOTE — Progress Notes (Signed)
Post-Op Visit Note   Patient: Jill [name]           Date of Birth: 10-18-1956           MRN: [id number] Visit Date: 04/14/2023 PCP: Donita Brooks, MD   Assessment & Plan:  Chief Complaint:  Chief Complaint  Patient presents with   Left Hip - Follow-up    Left THA 03/02/23   Visit Diagnoses:  1. Status post total replacement of left hip     Plan: [place] is 6 weeks status post left total hip replacement on 03/02/2023.  She is doing well and has no real complaints.  Examination left hip shows a fully healed bikini surgical scar.  Fluid painless range of motion of the hip.  X-rays show stable implant without any complications.  Instructions reviewed.  Dental prophylaxis reinforced.  Recheck in 6 weeks for 35-month visit.  Follow-Up Instructions: Return in about 6 weeks (around 05/26/2023) for with lindsey.   Orders:  Orders Placed This Encounter  Procedures   XR HIP UNILAT W OR W/O PELVIS 2-3 VIEWS LEFT   No orders of the defined types were placed in this encounter.   Imaging: XR HIP UNILAT W OR W/O PELVIS 2-3 VIEWS LEFT  Result Date: 04/14/2023 Stable left total hip replacement without complication   PMFS History: Patient Active Problem List   Diagnosis Date Noted   Status post total replacement of left hip 03/02/2023   Primary osteoarthritis of left hip 11/10/2022   Low back pain 04/10/2022   Somatic dysfunction of spine, sacral 04/10/2022   Adrenal nodule (HCC) 03/31/2017   Pancreatic cyst 03/31/2017   Chest pain 03/31/2017   Prediabetes 03/31/2017   Abdominal pain    Pancreatitis 03/27/2017   Hypertensive urgency 03/24/2017   Sinusitis 03/24/2017   Leukocytosis 03/24/2017   Headache 03/24/2017   Hyperglycemia 03/24/2017   Atrial tachycardia, paroxysmal 03/24/2017   HLD (hyperlipidemia) 10/29/2016   Past Medical History:  Diagnosis Date   Arthritis    "hands" (03/24/2017)   Colon polyp    High cholesterol    Multifocal atrial tachycardia     Pancreatic cyst    PAT (paroxysmal atrial tachycardia)    Hattie Perch 03/24/2017- d/t pheochromocytoma per patient   Peptic ulcer    "when I was a child"   Pheochromocytoma    removed in 2018- had HTN and tachycardia due to this   PONV (postoperative nausea and vomiting)    "bad when they did my wisdom teeth"   Pre-diabetes    Seasonal allergies     Family History  Problem Relation Age of Onset   Dementia Mother    Diabetes Father    Heart disease Father    Hypertension Father    Cancer Sister        endometrial   Cancer Maternal Aunt        ovarian   Adrenal disorder Neg Hx     Past Surgical History:  Procedure Laterality Date   ADRENALECTOMY  2018   CERVICAL CERCLAGE  X 2   COLONOSCOPY W/ BIOPSIES AND POLYPECTOMY     TONSILLECTOMY     TOTAL HIP ARTHROPLASTY Left 03/02/2023   Procedure: LEFT TOTAL HIP ARTHROPLASTY ANTERIOR APPROACH;  Surgeon: Tarry Kos, MD;  Location: [facility] OR;  Service: Orthopedics;  Laterality: Left;  3-C   WISDOM TOOTH EXTRACTION     Social History   Occupational History   Not on file  Tobacco Use   Smoking status:  Never   Smokeless tobacco: Never  Vaping Use   Vaping Use: Never used  Substance and Sexual Activity   Alcohol use: Yes    Comment: rarely   Drug use: No   Sexual activity: Yes

## 2023-04-14 NOTE — Telephone Encounter (Signed)
Ortho bundle in office visit completed. 

## 2023-05-06 ENCOUNTER — Other Ambulatory Visit: Payer: Self-pay

## 2023-05-06 ENCOUNTER — Encounter: Payer: Self-pay | Admitting: Orthopaedic Surgery

## 2023-05-06 ENCOUNTER — Other Ambulatory Visit: Payer: Self-pay | Admitting: Physician Assistant

## 2023-05-06 MED ORDER — DOXYCYCLINE HYCLATE 50 MG PO TABS: ORAL_TABLET | ORAL | 0 refills | Status: AC

## 2023-05-06 MED ORDER — CLINDAMYCIN HCL 300 MG PO CAPS: ORAL_CAPSULE | ORAL | 2 refills | Status: AC

## 2023-05-06 MED ORDER — CLINDAMYCIN HCL 150 MG PO CAPS: ORAL_CAPSULE | ORAL | 2 refills | Status: AC

## 2023-05-06 NOTE — Telephone Encounter (Signed)
Yes, we recommend this for two years following joint replacement surgery.  I sent in clindamycin instead of amoxicillin (pcn allergy in chart) to pharmacy on file

## 2023-06-16 ENCOUNTER — Other Ambulatory Visit: Payer: Self-pay

## 2023-06-16 ENCOUNTER — Ambulatory Visit: Payer: PPO | Admitting: Family Medicine

## 2023-06-16 ENCOUNTER — Encounter: Payer: Self-pay | Admitting: Family Medicine

## 2023-06-16 VITALS — BP 122/80 | HR 94 | Ht 63.0 in | Wt 145.0 lb

## 2023-06-16 DIAGNOSIS — M1612 Unilateral primary osteoarthritis, left hip: Secondary | ICD-10-CM

## 2023-06-16 DIAGNOSIS — G8929 Other chronic pain: Secondary | ICD-10-CM | POA: Diagnosis not present

## 2023-06-16 DIAGNOSIS — M25572 Pain in left ankle and joints of left foot: Secondary | ICD-10-CM

## 2023-06-16 DIAGNOSIS — M25552 Pain in left hip: Secondary | ICD-10-CM | POA: Diagnosis not present

## 2023-06-16 NOTE — Progress Notes (Unsigned)
   I, Stevenson Clinch, CMA acting as a scribe for Jill Graham, MD.  Jill [name] is a 67 y.o. female who presents to [facility] at [facility] today for L ankle pain. Pt was previously seen by Dr. Denyse Amass on 11/12/22 for L hip pain and she underwent a L total hip replacement in April by Dr. Roda Shutters.  Today, pt c/o L ankle pain x 2 weeks. Pt locates pain to lateral aspect of the ankle. No recent injury, hx of ankle sprain 40 years ago. Initially has pain with WB, not more with ambulation. Has tried changes with shoes and bracing with some relief. Has tried IBU in the past.   L ankle swelling: no Aggravates: ambulation, WB Treatments tried: compression, IBU, rest  She did have some physical therapy after hip replacement.  Her goal is to return to horse riding and she is having some trouble swinging her leg up onto the saddle.  She is interested in further physical therapy for her hip to help get her back in a horse riding.  Pertinent review of systems: No fevers or chills  Relevant historical information: Hip replacement left hip   Exam:  BP 122/80   Pulse 94   Ht 5\' 3"  (1.6 m)   Wt 145 lb (65.8 kg)   SpO2 97%   BMI 25.69 kg/m  General: Well Developed, well nourished, and in no acute distress.   MSK: Left ankle: Swelling present at lateral ankle. Otherwise normal-appearing Normal ankle motion. Intact strength. Stable ligamentous exam. Pulses cap refill and sensation are intact distally.    Lab and Radiology Results  Diagnostic Limited MSK Ultrasound of: Left lateral ankle Peroneal tendon is enlarged with hypoechoic fluid within tendon sheath consistent with peroneal tenosynovitis.  No visible full-thickness tear is present. Impression: Peroneal tenosynovitis     Assessment and Plan: 67 y.o. female with left ankle pain thought to be due to peroneal tenosynovitis.  She is a great candidate for therapy.  Plan for home exercise program and physical therapy.   Recommend compression ankle sleeve as well.  She is doing great after hip replacement but does not have the strength and range of motion she really should feel confident about getting in a saddle to ride her horse.  I think a little bit of dedicated physical therapy to work on increasing hip flexion abduction and rotation as well as the strength necessary to ride her horse safely will be helpful.   PDMP not reviewed this encounter. Orders Placed This Encounter  Procedures   [place] LIMITED JOINT SPACE STRUCTURES LOW LEFT(NO LINKED CHARGES)    Order Specific Question:   Reason for Exam (SYMPTOM  OR DIAGNOSIS REQUIRED)    Answer:   left ankle pain    Order Specific Question:   Preferred imaging location?    Answer:   [facility] referral to Physical Therapy    Referral Priority:   Routine    Referral Type:   Physical Medicine    Referral Reason:   Specialty Services Required    Requested Specialty:   Physical Therapy    Number of Visits Requested:   1   No orders of the defined types were placed in this encounter.    Discussed warning signs or symptoms. Please see discharge instructions. Patient expresses understanding.   The above documentation has been reviewed and is accurate and complete Jill [name], M.D.

## 2023-06-16 NOTE — Patient Instructions (Addendum)
Thank you for coming in today.   I've referred you to Physical Therapy.  Let us know if you don't hear from them in one week.   I recommend you obtained a compression sleeve to help with your joint problems. There are many options on the market however I recommend obtaining a Full Ankle Body Helix compression sleeve.  You can find information (including how to appropriate measure yourself for sizing) can be found at www.[email or web address].ch.  Many of these products are health savings account (HSA) eligible.   You can use the compression sleeve at any time throughout the day but is most important to use while being active as well as for 2 hours post-activity.   It is appropriate to ice following activity with the compression sleeve in place.   Check back in 6 weeks

## 2023-06-17 ENCOUNTER — Ambulatory Visit (INDEPENDENT_AMBULATORY_CARE_PROVIDER_SITE_OTHER): Payer: PPO | Admitting: Rehabilitative and Restorative Service Providers"

## 2023-06-17 ENCOUNTER — Other Ambulatory Visit: Payer: Self-pay

## 2023-06-17 ENCOUNTER — Encounter: Payer: Self-pay | Admitting: Rehabilitative and Restorative Service Providers"

## 2023-06-17 ENCOUNTER — Encounter: Payer: PPO | Admitting: Rehabilitative and Restorative Service Providers"

## 2023-06-17 DIAGNOSIS — M25572 Pain in left ankle and joints of left foot: Secondary | ICD-10-CM | POA: Diagnosis not present

## 2023-06-17 DIAGNOSIS — M25552 Pain in left hip: Secondary | ICD-10-CM | POA: Diagnosis not present

## 2023-06-17 DIAGNOSIS — R262 Difficulty in walking, not elsewhere classified: Secondary | ICD-10-CM | POA: Diagnosis not present

## 2023-06-17 DIAGNOSIS — M6281 Muscle weakness (generalized): Secondary | ICD-10-CM

## 2023-06-17 NOTE — Therapy (Addendum)
OUTPATIENT PHYSICAL THERAPY EVALUATION /DISCHARGE   Patient Name: Jill [name] MRN: [id number] DOB:Oct 23, 1956, 67 y.o., female Today's Date: 06/17/2023  END OF SESSION:  PT End of Session - 06/17/23 1554     Visit Number 1    Number of Visits 20    Date for PT Re-Evaluation 08/26/23    Authorization Type [employer] per EPIC chart    Progress Note Due on Visit 10    PT Start Time 1556    PT Stop Time 1636    PT Time Calculation (min) 40 min    Activity Tolerance Patient tolerated treatment well    Behavior During Therapy WFL for tasks assessed/performed             Past Medical History:  Diagnosis Date   Arthritis    "hands" (03/24/2017)   Colon polyp    High cholesterol    Multifocal atrial tachycardia    Pancreatic cyst    PAT (paroxysmal atrial tachycardia)    Hattie Perch 03/24/2017- d/t pheochromocytoma per patient   Peptic ulcer    "when I was a child"   Pheochromocytoma    removed in 2018- had HTN and tachycardia due to this   PONV (postoperative nausea and vomiting)    "bad when they did my wisdom teeth"   Pre-diabetes    Seasonal allergies    Past Surgical History:  Procedure Laterality Date   ADRENALECTOMY  2018   CERVICAL CERCLAGE  X 2   COLONOSCOPY W/ BIOPSIES AND POLYPECTOMY     TONSILLECTOMY     TOTAL HIP ARTHROPLASTY Left 03/02/2023   Procedure: LEFT TOTAL HIP ARTHROPLASTY ANTERIOR APPROACH;  Surgeon: Tarry Kos, MD;  Location: [facility] OR;  Service: Orthopedics;  Laterality: Left;  3-C   WISDOM TOOTH EXTRACTION     Patient Active Problem List   Diagnosis Date Noted   Status post total replacement of left hip 03/02/2023   Low back pain 04/10/2022   Somatic dysfunction of spine, sacral 04/10/2022   Adrenal nodule (HCC) 03/31/2017   Pancreatic cyst 03/31/2017   Chest pain 03/31/2017   Prediabetes 03/31/2017   Abdominal pain    Pancreatitis 03/27/2017   Hypertensive urgency 03/24/2017   Sinusitis 03/24/2017   Leukocytosis 03/24/2017    Headache 03/24/2017   Hyperglycemia 03/24/2017   Atrial tachycardia, paroxysmal 03/24/2017   HLD (hyperlipidemia) 10/29/2016    PCP: Donita Brooks MD  REFERRING PROVIDER: Rodolph Bong, MD  REFERRING DIAG: [phone] (ICD-10-CM) - Acute left ankle pain M25.552,G89.29 (ICD-10-CM) - Chronic left hip pain  THERAPY DIAG:  Pain in left hip  Pain in left ankle and joints of left foot  Muscle weakness (generalized)  Difficulty in walking, not elsewhere classified  Rationale for Evaluation and Treatment: Rehabilitation  ONSET DATE:   ankle 06/03/2023, hip surgery 03/02/2023  SUBJECTIVE:   SUBJECTIVE STATEMENT: Pt indicated having ankle complaints over last 2 weeks during time of increase exercise which led to MD visit.   Complaints were noted after walking around property.  Pain improved c some protected rest.  Symptoms were noted in back of achilles initially but then noted on lateral ankle region (history of bad sprain years ago).    Pt had hip replacement 03/02/2023 with goal of being able to ride a horse.  She reported weakness in adductor muscle and quad muscles.  She wanted to be able to lift leg and move it up and over saddle.  PERTINENT HISTORY: History of Lt THA anterior 03/02/2023  PAIN:  NPRS scale: Lt hip  /10 , Lt ankle at worst  8/10 Pain location: Lt hip, Lt ankle  Pain description: pain with motion Aggravating factors: walking distances/uneven surfaces, stairs, getting on saddle, riding Relieving factors: resting, ice   PRECAUTIONS: None  WEIGHT BEARING RESTRICTIONS: No  FALLS:  Has patient fallen in last 6 months? No  LIVING ENVIRONMENT: Lives with: lives with their family Lives in: House/apartment Stairs: 2 stairs  Has farm  OCCUPATION: no specific job  PLOF: Independent, farm Insurance account manager, horse riding.    PATIENT GOALS: Reduce pain, get back on horse.   OBJECTIVE:   PATIENT SURVEYS:  06/17/2023 Not taken today   COGNITION: 06/17/2023 Overall  cognitive status: WFL    SENSATION: 06/17/2023 Not tested  EDEMA:  06/17/2023 No specific measurements today.  No gross visual edema noted.   MUSCLE LENGTH: 06/17/2023 No specific testing   POSTURE:  06/17/2023 No Significant postural limitations  PALPATION: 06/17/2023:  mild tenderness lateral ankle.  LOWER EXTREMITY ROM:   AROM Right 06/17/2023 Left 06/17/2023  Hip flexion  120 in supine   Hip extension    Hip abduction    Hip adduction    Hip internal rotation 21 in 90 deg hip flexion in supine  25   in 90 deg hip flexion in supine   Hip external rotation 50  in 90 deg hip flexion in supine  44  in 90 deg hip flexion in supine   Knee flexion    Knee extension    Ankle dorsiflexion    Ankle plantarflexion    Ankle inversion    Ankle eversion     (Blank rows = not tested)  LOWER EXTREMITY MMT:  MMT Right 06/17/2023 Left 06/17/2023  Hip flexion 5/5 4+/5  Hip extension 5/5 5/5  Hip abduction 5/5 4/5  Hip adduction    Hip internal rotation    Hip external rotation    Knee flexion 5/5 5/5  Knee extension 5/5 54.7, 58 lbs 5/5 45, 39.7 lbs  Ankle dorsiflexion    Ankle plantarflexion 5/5 20 reps in SL PF 5/5 20 reps in SL PF  Ankle inversion    Ankle eversion     (Blank rows = not tested)  LOWER EXTREMITY SPECIAL TESTS:  06/17/2023 Not tested today  FUNCTIONAL TESTS:  06/17/2023 18 inch chair transfer: no UE assist, successful on 1st try.  Lt SLS:  5 seconds  Rt SLS: 10 seconds  GAIT: 06/17/2023 Independent                                                                                                                                                                        TODAY'S TREATMENT  DATE:06/17/2023 Therex:    HEP instruction/performance c cues for techniques, handout provided.  Trial set performed of each for comprehension and symptom assessment.  See below for exercise  list  PATIENT EDUCATION:  06/17/2023 Education details: HEP, POC Person educated: Patient Education method: Explanation, Demonstration, Verbal cues, and Handouts Education comprehension: verbalized understanding, returned demonstration, and verbal cues required  HOME EXERCISE PROGRAM: Access Code: [email or web address] URL: [email or web address] Date: 06/17/2023 Prepared by: Chyrel Masson  Exercises - Supine Piriformis Stretch Pulling Heel to Hip  - 1-2 x daily - 7 x weekly - 1 sets - 3-5 reps - 15-30 hold - Supine Figure 4 Piriformis Stretch (Mirrored)  - 1-2 x daily - 7 x weekly - 1 sets - 3-5 reps - 15-30 hold - Supine Bridge with Resistance Band  - 1-2 x daily - 7 x weekly - 2-3 sets - 10-15 reps - Sit to Stand  - 1-2 x daily - 7 x weekly - 2-3 sets - 10-15 reps - Seated Straight Leg Heel Taps  - 1-2 x daily - 7 x weekly - 2-3 sets - 10-15 reps  ASSESSMENT:  CLINICAL IMPRESSION: Patient is a 67 y.o. who comes to clinic with complaints of Lt hip, Lt ankle pain with mobility, strength and movement coordination deficits that impair their ability to perform usual daily and recreational functional activities without increase difficulty/symptoms at this time.  Patient to benefit from skilled PT services to address impairments and limitations to improve to previous level of function without restriction secondary to condition.   OBJECTIVE IMPAIRMENTS: decreased activity tolerance, decreased balance, decreased coordination, decreased endurance, decreased mobility, difficulty walking, decreased ROM, decreased strength, hypomobility, impaired perceived functional ability, impaired flexibility, improper body mechanics, and pain.   ACTIVITY LIMITATIONS: bending, standing, squatting, stairs, and locomotion level  PARTICIPATION LIMITATIONS: cleaning, laundry, interpersonal relationship, community activity, and yard work  PERSONAL FACTORS:  no specific limitations noted    REHAB POTENTIAL:  Good  CLINICAL DECISION MAKING: Stable/uncomplicated  EVALUATION COMPLEXITY: Low   GOALS: Goals reviewed with patient? Yes  SHORT TERM GOALS: (target date for Short term goals are 3 weeks 07/08/2023)   1.  Patient will demonstrate independent use of home exercise program to maintain progress from in clinic treatments.  Goal status: New  LONG TERM GOALS: (target dates for all long term goals are 10 weeks  08/26/2023 )   1. Patient will demonstrate/report pain at worst less than or equal to 2/10 to facilitate minimal limitation in daily activity secondary to pain symptoms.  Goal status: New   2. Patient will demonstrate independent use of home exercise program to facilitate ability to maintain/progress functional gains from skilled physical therapy services.  Goal status: New   3. Patient will demonstrate FOTO outcome > or = (will apply after testing next visit) % to indicate reduced disability due to condition.  Goal status: New   4.  Patient will demonstrate Lt  LE MMT 5/5, Lt knee dynamometry > 50 lbs to faciltiate usual transfers, stairs, squatting at PLOF for daily life.   Goal status: New   5.  Patient will demonstrate/report ability to ascend/descend stairs c reciprocal gait pattern at PLOF s limitation s UE assist.  Goal status: New   6.  Patient will demonstrate/report ability to get on /off and ride horse.  Goal status: New   7.  Patient will demonstrate bilateral SLS testing > 30 seconds to facilitate stability in ambulation on even and uneven surfaces.  Goal Status: New  PLAN:  PT FREQUENCY: 1-2x/week  PT DURATION: 10 weeks  PLANNED INTERVENTIONS: Therapeutic exercises, Therapeutic activity, Neuro Muscular re-education, Balance training, Gait training, Patient/Family education, Joint mobilization, Stair training, DME instructions, Dry Needling, Electrical stimulation, Traction, Cryotherapy, vasopneumatic deviceMoist heat, Taping, Ultrasound, Ionotophoresis  4mg /ml Dexamethasone, and aquatic therapy, Manual therapy.  All included unless contraindicated  PLAN FOR NEXT SESSION: Review HEP knowledge/results.  Progress hip/quad strengthening.  Check ankle MMT.    FOTO 2nd visit   Chyrel Masson, PT, DPT, OCS, ATC 06/17/23  4:39 PM   PHYSICAL THERAPY DISCHARGE SUMMARY  Visits from Start of Care: 1  Current functional level related to goals / functional outcomes: See note   Remaining deficits: See note   Education / Equipment: HEP  Patient goals were not met. Patient is being discharged due to not returning since the last visit.  Chyrel Masson, PT, DPT, OCS, ATC 07/06/23  2:58 PM

## 2023-06-22 ENCOUNTER — Encounter: Payer: PPO | Admitting: Physical Therapy

## 2023-06-29 ENCOUNTER — Encounter: Payer: PPO | Admitting: Physical Therapy

## 2023-07-06 ENCOUNTER — Telehealth: Payer: Self-pay | Admitting: Physical Therapy

## 2023-07-06 ENCOUNTER — Encounter: Payer: PPO | Admitting: Physical Therapy

## 2023-07-06 NOTE — Telephone Encounter (Signed)
I called pt to follow up after she missed her 2:30 PT appointment today. Pt was given our clinic number of [phone] to reschedule if needed.   Narda Amber, PT, MPT 07/06/23 2:52 PM

## 2023-07-13 ENCOUNTER — Encounter: Payer: PPO | Admitting: Physical Therapy

## 2023-07-21 ENCOUNTER — Ambulatory Visit (INDEPENDENT_AMBULATORY_CARE_PROVIDER_SITE_OTHER): Payer: PPO | Admitting: Family Medicine

## 2023-07-21 VITALS — BP 130/76 | HR 75 | Temp 98.2°F | Ht 63.0 in | Wt 145.0 lb

## 2023-07-21 DIAGNOSIS — R0781 Pleurodynia: Secondary | ICD-10-CM

## 2023-07-21 NOTE — Progress Notes (Signed)
Subjective:    Patient ID: Jill [name], female    DOB: [birth year], 67 y.o.   MRN: [id number]  Chest Pain   Patient presents with a 1 week history of pain in her left ribs.  The pain is located just below her left breast.  It hurts to palpation.  It hurts to take a deep breath in.  The pain began after she was sliding on a slip and slide.  The pain developed gradually a few days afterwards.  She denies any cough.  She denies any hemoptysis.  She denies any dyspnea on exertion.  She denies any fevers or chills.  The rib is tender to palpation  Past Medical History:  Diagnosis Date   Arthritis    "hands" (03/24/2017)   Colon polyp    High cholesterol    Multifocal atrial tachycardia    Pancreatic cyst    PAT (paroxysmal atrial tachycardia)    Hattie Perch 03/24/2017- d/t pheochromocytoma per patient   Peptic ulcer    "when I was a child"   Pheochromocytoma    removed in 2018- had HTN and tachycardia due to this   PONV (postoperative nausea and vomiting)    "bad when they did my wisdom teeth"   Pre-diabetes    Seasonal allergies    Past Surgical History:  Procedure Laterality Date   ADRENALECTOMY  2018   CERVICAL CERCLAGE  X 2   COLONOSCOPY W/ BIOPSIES AND POLYPECTOMY     TONSILLECTOMY     TOTAL HIP ARTHROPLASTY Left 03/02/2023   Procedure: LEFT TOTAL HIP ARTHROPLASTY ANTERIOR APPROACH;  Surgeon: Tarry Kos, MD;  Location: [facility] OR;  Service: Orthopedics;  Laterality: Left;  3-C   WISDOM TOOTH EXTRACTION     Current Outpatient Medications on File Prior to Visit  Medication Sig Dispense Refill   ascorbic acid (VITAMIN C) 500 MG tablet Take 500 mg by mouth daily. Bioflavanoic Compelx 200mg  Rutin 50mg      Calcium Carbonate Antacid (CALCIUM CARBONATE PO) Take 2,000 mg by mouth daily.     cholecalciferol (VITAMIN D) 1000 units tablet Take 2,000 Units by mouth daily.     clindamycin (CLEOCIN) 150 MG capsule Take 3 caps 1 hour prior to dental procedure. 3 capsule 2   clindamycin (CLEOCIN)  300 MG capsule Take two pills one hour prior to dental work 4 capsule 2   Doxycycline Hyclate 50 MG TABS Take 4 tablets by mouth 1 hour prior to dental procedure. 4 tablet 0   L-Lysine 1000 MG TABS Take 2,000 mg by mouth daily.     Multiple Vitamin (MULTIVITAMIN WITH MINERALS) TABS tablet Take 1 tablet by mouth daily.     Tart Cherry 1200 MG CAPS Take 1,200 mg by mouth daily.     triamcinolone (NASACORT ALLERGY 24HR) 55 MCG/ACT AERO nasal inhaler Place 2 sprays into the nose daily as needed (allergies).     No current facility-administered medications on file prior to visit.   Allergies  Allergen Reactions   Anesthetics, Ester Anaphylaxis    Pt pt, mixed with zyrtec made throat close up   Tessalon [Benzonatate] Anaphylaxis    Per pt, this mixed with Zyrtec made her throat "close up"   Zyrtec [Cetirizine] Anaphylaxis    Per pt, this mixed with Tessalon made her throat "close up"   Metrizamide Hives and Nausea Only   Codeine Nausea Only    Dizziness (also) and nausea is SEVERE   Contrast Media [Iodinated Contrast Media] Hives  And nausea   Penicillins Rash    Has patient had a PCN reaction causing immediate rash, facial/tongue/throat swelling, SOB or lightheadedness with hypotension: Yes Has patient had a PCN reaction causing severe rash involving mucus membranes or skin necrosis: No Has patient had a PCN reaction that required hospitalization No Has patient had a PCN reaction occurring within the last 10 years: No If all of the above answers are "NO", then may proceed with Cephalosporin use.    Valium [Diazepam] Nausea Only and Other (See Comments)    Dizziness (also)   Social History   Socioeconomic History   Marital status: Married    Spouse name: Not on file   Number of children: Not on file   Years of education: Not on file   Highest education level: Not on file  Occupational History   Not on file  Tobacco Use   Smoking status: Never   Smokeless tobacco: Never   Vaping Use   Vaping status: Never Used  Substance and Sexual Activity   Alcohol use: Yes    Comment: rarely   Drug use: No   Sexual activity: Yes  Other Topics Concern   Not on file  Social History Narrative   Not on file   Social Determinants of Health   Financial Resource Strain: Not on file  Food Insecurity: Not on file  Transportation Needs: Not on file  Physical Activity: Not on file  Stress: Not on file  Social Connections: Not on file  Intimate Partner Violence: Not on file       Review of Systems  Cardiovascular:  Positive for chest pain.  All other systems reviewed and are negative.      Objective:   Physical Exam Vitals reviewed.  Constitutional:      General: She is not in acute distress.    Appearance: She is well-developed and normal weight. She is not ill-appearing or toxic-appearing.  Neck:     Thyroid: No thyromegaly.     Vascular: No JVD.  Cardiovascular:     Rate and Rhythm: Normal rate and regular rhythm.     Heart sounds: Normal heart sounds. No murmur heard. Pulmonary:     Effort: Pulmonary effort is normal. No respiratory distress.     Breath sounds: Normal breath sounds. No decreased breath sounds, wheezing, rhonchi or rales.  Chest:     Chest wall: Tenderness present.    Abdominal:     General: Bowel sounds are normal. There is no distension.     Palpations: Abdomen is soft.     Tenderness: There is no abdominal tenderness. There is no guarding or rebound.  Neurological:     Mental Status: She is alert.           Assessment & Plan:  Rib pain - Plan: DG Ribs Unilateral Left I suspect the patient may have bruised a rib or strain of the intercostal muscle.  Recommended an x-ray if the pain persist.  The pain is mild right now so I recommended tincture of time for the next 1 to 2 weeks and I anticipate gradual improvement.  No evidence on exam for pneumothorax, pneumonia.  I do not suspect a life-threatening condition such as  pulmonary embolism

## 2023-07-23 ENCOUNTER — Ambulatory Visit: Payer: PPO | Admitting: Family Medicine

## 2023-09-28 ENCOUNTER — Encounter: Payer: Self-pay | Admitting: Family Medicine

## 2023-11-03 DIAGNOSIS — N898 Other specified noninflammatory disorders of vagina: Secondary | ICD-10-CM | POA: Diagnosis not present

## 2023-12-29 ENCOUNTER — Telehealth: Payer: Self-pay | Admitting: Family Medicine

## 2023-12-29 NOTE — Telephone Encounter (Signed)
Left messages on both numbers to return call; need to schedule Welcome to [employer] appointment before 01/02/24.

## 2024-01-01 NOTE — Telephone Encounter (Addendum)
Made second attempt to schedule Welcome to [employer] appointment; Left message to return call on both numbers.

## 2024-01-15 NOTE — Telephone Encounter (Signed)
Made third and final attempt to schedule [facility] to [facility] appointment with provider. Left message to return call.

## 2024-03-01 ENCOUNTER — Other Ambulatory Visit: Payer: Self-pay | Admitting: Physician Assistant

## 2024-03-01 DIAGNOSIS — K862 Cyst of pancreas: Secondary | ICD-10-CM

## 2024-03-15 DIAGNOSIS — K862 Cyst of pancreas: Secondary | ICD-10-CM | POA: Diagnosis not present

## 2024-03-18 ENCOUNTER — Ambulatory Visit
Admission: RE | Admit: 2024-03-18 | Discharge: 2024-03-18 | Disposition: A | Discharge status: Home / Self Care | Source: Ambulatory Visit | Attending: Physician Assistant | Admitting: Physician Assistant

## 2024-03-18 DIAGNOSIS — K838 Other specified diseases of biliary tract: Secondary | ICD-10-CM | POA: Diagnosis not present

## 2024-03-18 DIAGNOSIS — K828 Other specified diseases of gallbladder: Secondary | ICD-10-CM | POA: Diagnosis not present

## 2024-03-18 DIAGNOSIS — K862 Cyst of pancreas: Secondary | ICD-10-CM | POA: Diagnosis not present

## 2024-03-18 DIAGNOSIS — R160 Hepatomegaly, not elsewhere classified: Secondary | ICD-10-CM | POA: Diagnosis not present

## 2024-03-18 MED ORDER — GADOPICLENOL 0.5 MMOL/ML IV SOLN
7.0000 mL | Freq: Once | INTRAVENOUS | Status: AC | PRN
  Administered 2024-03-18: 7 mL via INTRAVENOUS

## 2024-04-14 ENCOUNTER — Telehealth: Payer: Self-pay

## 2024-04-14 NOTE — Telephone Encounter (Signed)
 Copied from CRM [id number]. Topic: Clinical - Lab/Test Results >> Apr 14, 2024  8:44 AM Lotus [place] B wrote: Reason for CRM: Bridgette Campus from [facility] called in to see if she can get the patients most recent labs sent to them . She said the patient is being seen there tomorrow 04/15/2024 so she is kind of in a rush if possible . Fax #  [phone] / Attention Bridgette Campus

## 2024-04-21 DIAGNOSIS — K862 Cyst of pancreas: Secondary | ICD-10-CM | POA: Diagnosis not present

## 2024-04-26 ENCOUNTER — Telehealth: Payer: Self-pay | Admitting: *Deleted

## 2024-04-26 NOTE — Telephone Encounter (Signed)
 Attempted 1 year post op call to patient. Left VM.

## 2024-05-17 DIAGNOSIS — H02889 Meibomian gland dysfunction of unspecified eye, unspecified eyelid: Secondary | ICD-10-CM | POA: Diagnosis not present

## 2024-05-17 DIAGNOSIS — H5213 Myopia, bilateral: Secondary | ICD-10-CM | POA: Diagnosis not present

## 2024-05-17 DIAGNOSIS — H2513 Age-related nuclear cataract, bilateral: Secondary | ICD-10-CM | POA: Diagnosis not present

## 2024-08-02 ENCOUNTER — Encounter: Payer: Self-pay | Admitting: Sports Medicine

## 2024-10-03 ENCOUNTER — Encounter: Payer: Self-pay | Admitting: Radiology

## 2024-10-04 ENCOUNTER — Other Ambulatory Visit: Payer: Self-pay

## 2024-10-04 ENCOUNTER — Ambulatory Visit: Admitting: Orthopaedic Surgery

## 2024-10-04 DIAGNOSIS — M1611 Unilateral primary osteoarthritis, right hip: Secondary | ICD-10-CM

## 2024-10-04 NOTE — Progress Notes (Signed)
 Office Visit Note   Patient: Jill [name]           Date of Birth: 12/23/1955           MRN: [id number] Visit Date: 10/04/2024              Requested by: Duanne Butler DASEN, MD [id number] [facility] [address] [place],  [place] [address] PCP: Duanne Butler DASEN, MD   Assessment & Plan: Visit Diagnoses:  1. Primary osteoarthritis of right hip     Plan: History of Present Illness Jill [name] is a 68 year old female with a history of left hip replacement who presents with right hip pain.  She experiences right hip pain for several months, described as cracking and popping, primarily in the buttock area, with occasional radiation to the groin. The pain has started to ache at night. Pain intensity ranges from zero to six out of ten, varying with activity level. It limits her ability to walk long distances, causing increased discomfort the following day. She avoids Advil or Aleve due to previous stomach issues. Her left hip replacement improved mobility, but she is concerned about the right hip's impact on her quality of life.  Physical Exam MUSCULOSKELETAL: Right hip pain on internal rotation.  Good gait pattern.  Assessment and Plan Right hip osteoarthritis Chronic right hip osteoarthritis osteophytic changes. Pain manageable without medication, activity limited. Surgery not recommended due to current pain level and quality of life. - Continue current management without surgery.  Follow-Up Instructions: No follow-ups on file.   Orders:  Orders Placed This Encounter  Procedures   XR HIP UNILAT W OR W/O PELVIS 2-3 VIEWS RIGHT   No orders of the defined types were placed in this encounter.     Procedures: No procedures performed   Clinical Data: No additional findings.   Subjective: Chief Complaint  Patient presents with   Right Hip - Pain    HPI  Review of Systems  Constitutional: Negative.   HENT: Negative.    Eyes: Negative.   Respiratory: Negative.    Cardiovascular:  Negative.   Endocrine: Negative.   Musculoskeletal: Negative.   Neurological: Negative.   Hematological: Negative.   Psychiatric/Behavioral: Negative.    All other systems reviewed and are negative.    Objective: Vital Signs: There were no vitals taken for this visit.  Physical Exam Vitals and nursing note reviewed.  Constitutional:      Appearance: She is well-developed.  HENT:     Head: Atraumatic.     Nose: Nose normal.  Eyes:     Extraocular Movements: Extraocular movements intact.  Cardiovascular:     Pulses: Normal pulses.  Pulmonary:     Effort: Pulmonary effort is normal.  Abdominal:     Palpations: Abdomen is soft.  Musculoskeletal:     Cervical back: Neck supple.  Skin:    General: Skin is warm.     Capillary Refill: Capillary refill takes less than 2 seconds.  Neurological:     Mental Status: She is alert. Mental status is at baseline.  Psychiatric:        Behavior: Behavior normal.        Thought Content: Thought content normal.        Judgment: Judgment normal.     Ortho Exam  Specialty Comments:  No specialty comments available.  Imaging: XR HIP UNILAT W OR W/O PELVIS 2-3 VIEWS RIGHT Result Date: 10/04/2024 Xrays show degenerative spurring of the femoral head  and acetabulum.  Joint space preserved.    PMFS History: Patient Active Problem List   Diagnosis Date Noted   Status post total replacement of left hip 03/02/2023   Low back pain 04/10/2022   Somatic dysfunction of spine, sacral 04/10/2022   Adrenal nodule 03/31/2017   Pancreatic cyst 03/31/2017   Chest pain 03/31/2017   Prediabetes 03/31/2017   Abdominal pain    Pancreatitis 03/27/2017   Hypertensive urgency 03/24/2017   Sinusitis 03/24/2017   Leukocytosis 03/24/2017   Headache 03/24/2017   Hyperglycemia 03/24/2017   Atrial tachycardia, paroxysmal 03/24/2017   HLD (hyperlipidemia) 10/29/2016   Past Medical History:  Diagnosis Date   Arthritis    hands (03/24/2017)    Colon polyp    High cholesterol    Multifocal atrial tachycardia    Pancreatic cyst    PAT (paroxysmal atrial tachycardia)    thelbert 03/24/2017- d/t pheochromocytoma per patient   Peptic ulcer    when I was a child   Pheochromocytoma    removed in 2018- had HTN and tachycardia due to this   PONV (postoperative nausea and vomiting)    bad when they did my wisdom teeth   Pre-diabetes    Seasonal allergies     Family History  Problem Relation Age of Onset   Dementia Mother    Diabetes Father    Heart disease Father    Hypertension Father    Cancer Sister        endometrial   Cancer Maternal Aunt        ovarian   Adrenal disorder Neg Hx     Past Surgical History:  Procedure Laterality Date   ADRENALECTOMY  2018   CERVICAL CERCLAGE  X 2   COLONOSCOPY W/ BIOPSIES AND POLYPECTOMY     TONSILLECTOMY     TOTAL HIP ARTHROPLASTY Left 03/02/2023   Procedure: LEFT TOTAL HIP ARTHROPLASTY ANTERIOR APPROACH;  Surgeon: Jerri Kay HERO, MD;  Location: [facility] OR;  Service: Orthopedics;  Laterality: Left;  3-C   WISDOM TOOTH EXTRACTION     Social History   Occupational History   Not on file  Tobacco Use   Smoking status: Never   Smokeless tobacco: Never  Vaping Use   Vaping status: Never Used  Substance and Sexual Activity   Alcohol use: Yes    Comment: rarely   Drug use: No   Sexual activity: Yes
